# Patient Record
Sex: Female | Born: 1960
Health system: Southern US, Community
[De-identification: ages and names within clinical notes are randomized; demographics above are authoritative.]

## PROBLEM LIST (undated history)

## (undated) DIAGNOSIS — E785 Hyperlipidemia, unspecified: Secondary | ICD-10-CM

## (undated) DIAGNOSIS — IMO0002 Reserved for concepts with insufficient information to code with codable children: Secondary | ICD-10-CM

## (undated) DIAGNOSIS — F419 Anxiety disorder, unspecified: Secondary | ICD-10-CM

## (undated) DIAGNOSIS — D219 Benign neoplasm of connective and other soft tissue, unspecified: Secondary | ICD-10-CM

## (undated) DIAGNOSIS — I10 Essential (primary) hypertension: Secondary | ICD-10-CM

## (undated) DIAGNOSIS — Z9289 Personal history of other medical treatment: Secondary | ICD-10-CM

## (undated) HISTORY — DX: Hyperlipidemia, unspecified: E78.5

## (undated) HISTORY — DX: Reserved for concepts with insufficient information to code with codable children: IMO0002

## (undated) HISTORY — PX: COLONOSCOPY: SHX174

## (undated) HISTORY — DX: Personal history of other medical treatment: Z92.89

## (undated) HISTORY — PX: WISDOM TOOTH EXTRACTION: SHX21

## (undated) HISTORY — PX: MENISCUS REPAIR: SHX5179

## (undated) HISTORY — DX: Benign neoplasm of connective and other soft tissue, unspecified: D21.9

## (undated) HISTORY — DX: Essential (primary) hypertension: I10

## (undated) HISTORY — DX: Anxiety disorder, unspecified: F41.9

---

## 1980-10-11 HISTORY — PX: CRYOABLATION: SHX1415

## 2004-10-11 DIAGNOSIS — IMO0002 Reserved for concepts with insufficient information to code with codable children: Secondary | ICD-10-CM

## 2004-10-11 DIAGNOSIS — R87619 Unspecified abnormal cytological findings in specimens from cervix uteri: Secondary | ICD-10-CM

## 2004-10-11 HISTORY — PX: INTRAUTERINE DEVICE INSERTION: SHX323

## 2004-10-11 HISTORY — DX: Unspecified abnormal cytological findings in specimens from cervix uteri: R87.619

## 2004-10-11 HISTORY — DX: Reserved for concepts with insufficient information to code with codable children: IMO0002

## 2005-05-07 ENCOUNTER — Ambulatory Visit: Payer: Self-pay | Admitting: Family Medicine

## 2005-08-23 ENCOUNTER — Ambulatory Visit: Payer: Self-pay | Admitting: Family Medicine

## 2006-08-01 ENCOUNTER — Ambulatory Visit: Payer: Self-pay | Admitting: Family Medicine

## 2006-08-01 ENCOUNTER — Other Ambulatory Visit: Admission: RE | Admit: 2006-08-01 | Discharge: 2006-08-01 | Payer: Self-pay | Admitting: Family Medicine

## 2006-08-25 ENCOUNTER — Encounter: Admission: RE | Admit: 2006-08-25 | Discharge: 2006-08-25 | Payer: Self-pay | Admitting: Family Medicine

## 2006-10-01 ENCOUNTER — Encounter: Payer: Self-pay | Admitting: Family Medicine

## 2006-10-01 LAB — CONVERTED CEMR LAB: Pap Smear: NORMAL

## 2006-11-16 ENCOUNTER — Ambulatory Visit: Payer: Self-pay | Admitting: Obstetrics & Gynecology

## 2007-09-13 ENCOUNTER — Encounter: Admission: RE | Admit: 2007-09-13 | Discharge: 2007-09-13 | Payer: Self-pay | Admitting: Family Medicine

## 2007-09-18 ENCOUNTER — Encounter (INDEPENDENT_AMBULATORY_CARE_PROVIDER_SITE_OTHER): Payer: Self-pay | Admitting: *Deleted

## 2007-10-09 ENCOUNTER — Ambulatory Visit: Payer: Self-pay | Admitting: Family Medicine

## 2007-10-09 DIAGNOSIS — IMO0002 Reserved for concepts with insufficient information to code with codable children: Secondary | ICD-10-CM | POA: Insufficient documentation

## 2007-10-09 DIAGNOSIS — M170 Bilateral primary osteoarthritis of knee: Secondary | ICD-10-CM | POA: Insufficient documentation

## 2007-10-09 DIAGNOSIS — E785 Hyperlipidemia, unspecified: Secondary | ICD-10-CM | POA: Insufficient documentation

## 2007-10-09 DIAGNOSIS — M1710 Unilateral primary osteoarthritis, unspecified knee: Secondary | ICD-10-CM

## 2007-10-12 DIAGNOSIS — Z9289 Personal history of other medical treatment: Secondary | ICD-10-CM

## 2007-10-12 HISTORY — DX: Personal history of other medical treatment: Z92.89

## 2007-10-12 LAB — CONVERTED CEMR LAB: Pap Smear: NORMAL

## 2007-10-25 ENCOUNTER — Ambulatory Visit: Payer: Self-pay | Admitting: Obstetrics & Gynecology

## 2007-10-25 ENCOUNTER — Encounter: Payer: Self-pay | Admitting: Family Medicine

## 2007-10-26 ENCOUNTER — Ambulatory Visit (HOSPITAL_COMMUNITY): Admission: RE | Admit: 2007-10-26 | Discharge: 2007-10-26 | Payer: Self-pay | Admitting: Gynecology

## 2007-11-09 ENCOUNTER — Ambulatory Visit: Payer: Self-pay | Admitting: Family Medicine

## 2007-11-13 LAB — CONVERTED CEMR LAB
ALT: 11 units/L (ref 0–35)
AST: 21 units/L (ref 0–37)
Albumin: 3.6 g/dL (ref 3.5–5.2)
Alkaline Phosphatase: 65 units/L (ref 39–117)
BUN: 5 mg/dL — ABNORMAL LOW (ref 6–23)
Basophils Absolute: 0 10*3/uL (ref 0.0–0.1)
Basophils Relative: 0 % (ref 0.0–1.0)
Bilirubin, Direct: 0.1 mg/dL (ref 0.0–0.3)
CO2: 28 meq/L (ref 19–32)
Calcium: 9.2 mg/dL (ref 8.4–10.5)
Chloride: 106 meq/L (ref 96–112)
Cholesterol: 176 mg/dL (ref 0–200)
Creatinine, Ser: 1.1 mg/dL (ref 0.4–1.2)
Eosinophils Absolute: 0.1 10*3/uL (ref 0.0–0.6)
Eosinophils Relative: 1.3 % (ref 0.0–5.0)
GFR calc Af Amer: 68 mL/min
GFR calc non Af Amer: 56 mL/min
Glucose, Bld: 96 mg/dL (ref 70–99)
HCT: 41.1 % (ref 36.0–46.0)
HDL: 54.2 mg/dL (ref >39.0)
Hemoglobin: 13.3 g/dL (ref 12.0–15.0)
LDL Cholesterol: 111 mg/dL — ABNORMAL HIGH (ref 0–99)
Lymphocytes Relative: 29.4 % (ref 12.0–46.0)
MCHC: 32.4 g/dL (ref 30.0–36.0)
MCV: 98.7 fL (ref 78.0–100.0)
Monocytes Absolute: 0.6 10*3/uL (ref 0.2–0.7)
Monocytes Relative: 7.7 % (ref 3.0–11.0)
Neutro Abs: 4.5 10*3/uL (ref 1.4–7.7)
Neutrophils Relative %: 61.6 % (ref 43.0–77.0)
Platelets: 268 10*3/uL (ref 150–400)
Potassium: 4.1 meq/L (ref 3.5–5.1)
RBC: 4.16 M/uL (ref 3.87–5.11)
RDW: 12.3 % (ref 11.5–14.6)
Sodium: 140 meq/L (ref 135–145)
TSH: 2.02 microintl units/mL (ref 0.35–5.50)
Total Bilirubin: 1.1 mg/dL (ref 0.3–1.2)
Total CHOL/HDL Ratio: 3.2
Total Protein: 7.3 g/dL (ref 6.0–8.3)
Triglycerides: 55 mg/dL (ref 0–149)
VLDL: 11 mg/dL (ref 0–40)
WBC: 7.3 10*3/uL (ref 4.5–10.5)

## 2007-11-29 ENCOUNTER — Ambulatory Visit: Payer: Self-pay | Admitting: Family Medicine

## 2008-01-08 ENCOUNTER — Ambulatory Visit: Payer: Self-pay | Admitting: Family Medicine

## 2008-02-19 ENCOUNTER — Ambulatory Visit: Payer: Self-pay | Admitting: Family Medicine

## 2008-05-22 ENCOUNTER — Ambulatory Visit: Payer: Self-pay | Admitting: Obstetrics & Gynecology

## 2008-05-24 ENCOUNTER — Ambulatory Visit (HOSPITAL_COMMUNITY): Admission: RE | Admit: 2008-05-24 | Discharge: 2008-05-24 | Payer: Self-pay | Admitting: Gynecology

## 2008-06-24 ENCOUNTER — Ambulatory Visit: Payer: Self-pay | Admitting: Obstetrics & Gynecology

## 2008-08-20 ENCOUNTER — Ambulatory Visit: Payer: Self-pay | Admitting: Family Medicine

## 2008-08-21 LAB — CONVERTED CEMR LAB
ALT: 10 units/L (ref 0–35)
AST: 18 units/L (ref 0–37)
Albumin: 3.4 g/dL — ABNORMAL LOW (ref 3.5–5.2)
Alkaline Phosphatase: 60 units/L (ref 39–117)
BUN: 7 mg/dL (ref 6–23)
Basophils Absolute: 0 10*3/uL (ref 0.0–0.1)
Basophils Relative: 0.5 % (ref 0.0–3.0)
Bilirubin, Direct: 0.1 mg/dL (ref 0.0–0.3)
CO2: 27 meq/L (ref 19–32)
Calcium: 9.2 mg/dL (ref 8.4–10.5)
Chloride: 106 meq/L (ref 96–112)
Cholesterol: 180 mg/dL (ref 0–200)
Creatinine, Ser: 0.9 mg/dL (ref 0.4–1.2)
Eosinophils Absolute: 0.1 10*3/uL (ref 0.0–0.7)
Eosinophils Relative: 1.4 % (ref 0.0–5.0)
GFR calc Af Amer: 86 mL/min
GFR calc non Af Amer: 71 mL/min
Glucose, Bld: 84 mg/dL (ref 70–99)
HCT: 39.1 % (ref 36.0–46.0)
HDL: 46.8 mg/dL (ref >39.0)
Hemoglobin: 13.2 g/dL (ref 12.0–15.0)
LDL Cholesterol: 112 mg/dL — ABNORMAL HIGH (ref 0–99)
Lymphocytes Relative: 28 % (ref 12.0–46.0)
MCHC: 33.7 g/dL (ref 30.0–36.0)
MCV: 99.2 fL (ref 78.0–100.0)
Monocytes Absolute: 0.4 10*3/uL (ref 0.1–1.0)
Monocytes Relative: 6.8 % (ref 3.0–12.0)
Neutro Abs: 4.1 10*3/uL (ref 1.4–7.7)
Neutrophils Relative %: 63.3 % (ref 43.0–77.0)
Platelets: 247 10*3/uL (ref 150–400)
Potassium: 4 meq/L (ref 3.5–5.1)
RBC: 3.94 M/uL (ref 3.87–5.11)
RDW: 12.2 % (ref 11.5–14.6)
Sodium: 139 meq/L (ref 135–145)
TSH: 2 microintl units/mL (ref 0.35–5.50)
Total Bilirubin: 0.8 mg/dL (ref 0.3–1.2)
Total CHOL/HDL Ratio: 3.8
Total Protein: 7.1 g/dL (ref 6.0–8.3)
Triglycerides: 107 mg/dL (ref 0–149)
VLDL: 21 mg/dL (ref 0–40)
WBC: 6.4 10*3/uL (ref 4.5–10.5)

## 2008-09-19 ENCOUNTER — Ambulatory Visit: Payer: Self-pay | Admitting: Obstetrics & Gynecology

## 2008-09-30 ENCOUNTER — Encounter: Admission: RE | Admit: 2008-09-30 | Discharge: 2008-09-30 | Payer: Self-pay | Admitting: Family Medicine

## 2008-12-23 ENCOUNTER — Encounter: Payer: Self-pay | Admitting: Obstetrics & Gynecology

## 2008-12-23 ENCOUNTER — Ambulatory Visit: Payer: Self-pay | Admitting: Obstetrics & Gynecology

## 2008-12-23 LAB — CONVERTED CEMR LAB: Pap Smear: NORMAL

## 2008-12-24 ENCOUNTER — Encounter: Payer: Self-pay | Admitting: Family Medicine

## 2008-12-24 LAB — CONVERTED CEMR LAB
Trich, Wet Prep: NONE SEEN
Yeast Wet Prep HPF POC: NONE SEEN

## 2009-02-18 ENCOUNTER — Ambulatory Visit: Payer: Self-pay | Admitting: Family Medicine

## 2009-02-28 ENCOUNTER — Ambulatory Visit: Payer: Self-pay | Admitting: Internal Medicine

## 2009-03-17 ENCOUNTER — Ambulatory Visit: Payer: Self-pay | Admitting: Internal Medicine

## 2009-03-17 LAB — HM COLONOSCOPY

## 2009-10-01 ENCOUNTER — Encounter: Admission: RE | Admit: 2009-10-01 | Discharge: 2009-10-01 | Payer: Self-pay | Admitting: Family Medicine

## 2009-10-06 ENCOUNTER — Encounter (INDEPENDENT_AMBULATORY_CARE_PROVIDER_SITE_OTHER): Payer: Self-pay | Admitting: *Deleted

## 2009-12-25 ENCOUNTER — Ambulatory Visit: Payer: Self-pay | Admitting: Family Medicine

## 2010-01-29 ENCOUNTER — Ambulatory Visit: Payer: Self-pay | Admitting: Family Medicine

## 2010-01-29 DIAGNOSIS — I10 Essential (primary) hypertension: Secondary | ICD-10-CM | POA: Insufficient documentation

## 2010-01-29 LAB — CONVERTED CEMR LAB
BUN: 6 mg/dL (ref 6–23)
Basophils Absolute: 0 10*3/uL (ref 0.0–0.1)
Basophils Relative: 0.3 % (ref 0.0–3.0)
CO2: 27 meq/L (ref 19–32)
Calcium: 9.3 mg/dL (ref 8.4–10.5)
Chloride: 106 meq/L (ref 96–112)
Creatinine, Ser: 0.9 mg/dL (ref 0.4–1.2)
Eosinophils Absolute: 0.1 10*3/uL (ref 0.0–0.7)
Eosinophils Relative: 1.7 % (ref 0.0–5.0)
GFR calc non Af Amer: 84.79 mL/min (ref >60)
Glucose, Bld: 91 mg/dL (ref 70–99)
HCT: 37.8 % (ref 36.0–46.0)
Hemoglobin: 12.6 g/dL (ref 12.0–15.0)
Lymphocytes Relative: 29.1 % (ref 12.0–46.0)
Lymphs Abs: 1.7 10*3/uL (ref 0.7–4.0)
MCHC: 33.5 g/dL (ref 30.0–36.0)
MCV: 98.9 fL (ref 78.0–100.0)
Monocytes Absolute: 0.4 10*3/uL (ref 0.1–1.0)
Monocytes Relative: 7.6 % (ref 3.0–12.0)
Neutro Abs: 3.5 10*3/uL (ref 1.4–7.7)
Neutrophils Relative %: 61.3 % (ref 43.0–77.0)
Platelets: 263 10*3/uL (ref 150.0–400.0)
Potassium: 4.1 meq/L (ref 3.5–5.1)
RBC: 3.82 M/uL — ABNORMAL LOW (ref 3.87–5.11)
RDW: 13.2 % (ref 11.5–14.6)
Sodium: 139 meq/L (ref 135–145)
TSH: 1.9 microintl units/mL (ref 0.35–5.50)
WBC: 5.8 10*3/uL (ref 4.5–10.5)

## 2010-02-11 ENCOUNTER — Ambulatory Visit: Payer: Self-pay | Admitting: Family Medicine

## 2010-02-13 LAB — CONVERTED CEMR LAB
ALT: 9 units/L (ref 0–35)
AST: 24 units/L (ref 0–37)
Albumin: 3.8 g/dL (ref 3.5–5.2)
BUN: 9 mg/dL (ref 6–23)
CO2: 32 meq/L (ref 19–32)
Calcium: 9.7 mg/dL (ref 8.4–10.5)
Chloride: 104 meq/L (ref 96–112)
Cholesterol: 191 mg/dL (ref 0–200)
Creatinine, Ser: 1 mg/dL (ref 0.4–1.2)
GFR calc non Af Amer: 75.08 mL/min (ref >60)
Glucose, Bld: 81 mg/dL (ref 70–99)
HDL: 60.5 mg/dL (ref >39.00)
LDL Cholesterol: 112 mg/dL — ABNORMAL HIGH (ref 0–99)
Phosphorus: 3.7 mg/dL (ref 2.3–4.6)
Potassium: 4.1 meq/L (ref 3.5–5.1)
Sodium: 142 meq/L (ref 135–145)
Total CHOL/HDL Ratio: 3
Triglycerides: 95 mg/dL (ref 0.0–149.0)
VLDL: 19 mg/dL (ref 0.0–40.0)

## 2010-08-18 ENCOUNTER — Ambulatory Visit: Payer: Self-pay | Admitting: Family Medicine

## 2010-08-18 DIAGNOSIS — M549 Dorsalgia, unspecified: Secondary | ICD-10-CM | POA: Insufficient documentation

## 2010-10-02 ENCOUNTER — Encounter
Admission: RE | Admit: 2010-10-02 | Discharge: 2010-10-02 | Payer: Self-pay | Source: Home / Self Care | Attending: Family Medicine | Admitting: Family Medicine

## 2010-10-02 LAB — HM MAMMOGRAPHY: HM Mammogram: NORMAL

## 2010-10-06 ENCOUNTER — Encounter (INDEPENDENT_AMBULATORY_CARE_PROVIDER_SITE_OTHER): Payer: Self-pay | Admitting: *Deleted

## 2010-10-06 ENCOUNTER — Encounter: Payer: Self-pay | Admitting: Family Medicine

## 2010-11-10 NOTE — Assessment & Plan Note (Signed)
Summary: 2 m f/u  dlo   Vital Signs:  Patient Profile:   50 Years Old Gibbs Height:     66 inches Weight:      166 pounds Temp:     97.8 degrees F oral Pulse rate:   84 / minute Pulse rhythm:   regular BP sitting:   176 / 102  (right arm) Cuff size:   large  Vitals Entered By: Lowella Petties (March Kristin, 2009 12:05 PM)                 Serial Vital Signs/Assessments:  Time      Position  BP       Pulse  Resp  Temp     By                     145/95                         Judith Part MD   Chief Complaint:  2 month follow up.  History of Present Illness: blood pressure is still elevated today  thinks IUD may have aff blood pressure had IUD removed on 2/18 she has been reading about other pts with longer term reactions to the iud  still having symptoms since last peroid which was immed, then heavy menses 20 days later- and that just ended occ had episodes of palpitations -during the day   OC in past would cause bp to go up        Current Allergies: No known allergies   Past Medical History:    Reviewed history from 11/09/2007 and no changes required:       Hyperlipidemia       uterine fibroids       elevated bp   Past Surgical History:    Reviewed history from 11/09/2007 and no changes required:       Knee surgery (2002)       Abn. pap- endometrial cells, biopsy o.k. (2006)       IUD removed (2006)       pelvic US 1/09 uterine fibroids   Family History:    Reviewed history from 11/09/2007 and no changes required:       Father: glaucoma       Mother: heart disease, MI, smoker, thyroid probs, chol ,HTN       Siblings:        daughter goiter  Social History:    Reviewed history from 10/09/2007 and no changes required:       Marital Status: single       Children: 2 daughters       Occupation: [employer]     Physical Exam  General:     Well-developed,well-nourished,in no acute distress; alert,appropriate and cooperative throughout  examination Head:     normocephalic, atraumatic, and no abnormalities observed.   Eyes:     vision grossly intact, pupils equal, pupils round, and pupils reactive to light.   Neck:     supple with full rom and no masses or thyromegally, no JVD or carotid bruit  Lungs:     Normal respiratory effort, chest expands symmetrically. Lungs are clear to auscultation, no crackles or wheezes. Heart:     Normal rate and regular rhythm. S1 and S2 normal without gallop, murmur, click, rub or other extra sounds. Abdomen:     no renal bruitssoft, non-tender, normal bowel sounds, and no masses.   Pulses:  R and L carotid,radial,femoral,dorsalis pedis and posterior tibial pulses are full and equal bilaterally Extremities:     No clubbing, cyanosis, edema, or deformity noted with normal full range of motion of all joints.   Neurologic:     cranial nerves II-XII intact, sensation intact to light touch, gait normal, and DTRs symmetrical and normal.  no tremor  Skin:     Intact without suspicious lesions or rashes Cervical Nodes:     No lymphadenopathy noted Psych:     normal affect, talkative and pleasant     Impression & Recommendations:  Problem # 1:  ELEVATED BLOOD PRESSURE WITHOUT DIAGNOSIS OF HYPERTENSION (ICD-796.2) Assessment: Deteriorated pt still in doubt that she could be developing essential HTN and wonders about hormonal factor she will be f/u with gyn to disc further otherwise- will obt home bp cuff and begin randomly checking it continue good lifestyle change- and f/u 1-2 mo will disc tx or perhaps cardiol consult if not better if any more episodes of dizziness/palp- will update Orders: EKG w/ Interpretation (93000)   Complete Medication List: 1)  Multivitamins Tabs (Multiple vitamin) .... Take one by mouth once a day   Patient Instructions: 1)  the blood pressure cuff to get is OMRON for arm (not wrist) 2)  check blood pressure when relaxed- not right after exercise,  not when upset or stressed 3)  keep up great habits and avoid salty foods 4)  follow up with Dr Shawnie Pons for questions about hormones 5)  follow up in 1-2 months with blood pressure readiings and cuff     ]  EKG  Procedure date:  03/Kristin/2009  Findings:      NSR rate 75 computer read as st junctional depression - nonspecific no acute changes per my reading

## 2010-11-10 NOTE — Procedures (Signed)
Summary: Colonoscopy   Colonoscopy  Procedure date:  03/17/2009  Findings:      Location:  [facility].    Procedures Next Due Date:    Colonoscopy: 03/2019  COLONOSCOPY PROCEDURE REPORT  PATIENT:  [name], [name]  MR#:  [id number] BIRTHDATE:   October 31, 1960, 48 yrs. old   GENDER:   female  ENDOSCOPIST:   Hedwig Morton. Juanda Chance, MD Referred by: Marne A. Milinda Antis, M.D.  PROCEDURE DATE:  03/17/2009 PROCEDURE:  Average-risk screening colonoscopy ASA CLASS:   Class I INDICATIONS: Routine Risk Screening   MEDICATIONS:    Versed 7 mg, Fentanyl 50 mcg  DESCRIPTION OF PROCEDURE:   After the risks benefits and alternatives of the procedure were thoroughly explained, informed consent was obtained.  Digital rectal exam was performed and revealed no rectal masses.   The LB [id number] endoscope was introduced through the anus and advanced to the cecum, which was identified by both the appendix and ileocecal valve, without limitations.  The quality of the prep was good, using MoviPrep.  The instrument was then slowly withdrawn as the colon was fully examined. <<PROCEDUREIMAGES>>        <<OLD IMAGES>>  FINDINGS:  A normal appearing cecum, ileocecal valve, and appendiceal orifice were identified. The ascending, transverse, descending, sigmoid colon, and rectum appeared unremarkable (see image1, image2, image3, and image4).   Retroflexed views in the rectum revealed no abnormalities.    The scope was then withdrawn from the patient and the procedure completed.  COMPLICATIONS:   None  ENDOSCOPIC IMPRESSION:  1) Normal colon  2) No polyps or cancers RECOMMENDATIONS:  1) hemoccults q 2-3 years  2) high fiber diet  REPEAT EXAM:   In 10 year(s) for.   _______________________________ Hedwig Morton. Juanda Chance, MD  CC:

## 2010-11-10 NOTE — Assessment & Plan Note (Signed)
Summary: 6 m f/u  dlo   Vital Signs:  Patient Profile:   50 Years Old Female Height:     66 inches Weight:      167 pounds BMI:     27.05 Temp:     98 degrees F oral Pulse rate:   96 / minute Pulse rhythm:   regular BP sitting:   160 / 100  (left arm) Cuff size:   regular  Vitals Entered By: Liane Comber (August 20, 2008 8:37 AM)                 Serial Vital Signs/Assessments:  Time      Position  BP       Pulse  Resp  Temp     By                     145/90                         Judith Part MD   Chief Complaint:  6 mo f/u.  History of Present Illness: takes her bp at home regularly--  systolic is 125-130 over 89 (never gets over 90) overall is feeling fine   is still exercising 3 days per week- wants to increase this  wt is stable  still trying to loose wt- is eating very healthy  has tested her bp cuff-- and it is accurate   PE is due at end of Jan  saw gyn around sept   is interested in trying estroven for nt time hot flashes and sleeplessness  wakes up with sweats  is still have some intermittent vag bleeding -- irregular   needs ref/ sched for mam  no lumps on self breast exams      Current Allergies (reviewed today): No known allergies   Past Medical History:    Reviewed history from 02/19/2008 and no changes required:       Hyperlipidemia       uterine fibroids       elevated bp - whitecoat              [facility]                 Past Surgical History:    Reviewed history from 11/09/2007 and no changes required:       Knee surgery (2002)       Abn. pap- endometrial cells, biopsy o.k. (2006)       IUD removed (2006)       pelvic US 1/09 uterine fibroids   Family History:    Reviewed history from 11/09/2007 and no changes required:       Father: glaucoma       Mother: heart disease, MI with (stent), smoker, thyroid probs, chol ,HTN       Siblings:        daughter goiter  Social History:    Reviewed history  from 02/19/2008 and no changes required:       Marital Status: single       Children: 2 daughters       Occupation: [employer]       non smoker       rare alcohol    Review of Systems  General      Denies fatigue, fever, and loss of appetite.  Eyes      Denies blurring and eye pain.  CV  Denies chest pain or discomfort, palpitations, and shortness of breath with exertion.  Resp      Denies cough and wheezing.  GI      Denies abdominal pain and change in bowel habits.  GU      Denies urinary frequency.  MS      Complains of joint pain.      R knee sore (meniscal surg in past- suspects OA)  Derm      Denies lesion(s) and rash.  Neuro      Denies numbness and tingling.  Psych      mood is good   Endo      Denies excessive thirst and excessive urination.   Physical Exam  General:     Well-developed,well-nourished,in no acute distress; alert,appropriate and cooperative throughout examination Head:     normocephalic, atraumatic, and no abnormalities observed.   Eyes:     vision grossly intact, pupils equal, pupils round, and pupils reactive to light.   Mouth:     pharynx pink and moist.   Neck:     supple with full rom and no masses or thyromegally, no JVD or carotid bruit  Lungs:     Normal respiratory effort, chest expands symmetrically. Lungs are clear to auscultation, no crackles or wheezes. Heart:     Normal rate and regular rhythm. S1 and S2 normal without gallop, murmur, click, rub or other extra sounds. Abdomen:     soft and non-tender.  no renal bruits  Msk:     No deformity or scoliosis noted of thoracic or lumbar spine.   Pulses:     R and L carotid,radial,femoral,dorsalis pedis and posterior tibial pulses are full and equal bilaterally Extremities:     No clubbing, cyanosis, edema, or deformity noted with normal full range of motion of all joints.   Neurologic:     sensation intact to light touch, gait normal, and DTRs symmetrical and normal.   no tremor  Skin:     Intact without suspicious lesions or rashes Cervical Nodes:     No lymphadenopathy noted Psych:     normal affect, talkative and pleasant     Impression & Recommendations:  Problem # 1:  ELEVATED BLOOD PRESSURE WITHOUT DIAGNOSIS OF HYPERTENSION (ICD-796.2) Assessment: Unchanged with much better bp at home with accurate cuff need to watch borderline diastolic bp  adv to keep working on diet and exercise labs today f/u 6 mo for check up Orders: Venipuncture (52841) TLB-Lipid Panel (80061-LIPID) TLB-CBC Platelet - w/Differential (85025-CBCD) TLB-BMP (Basic Metabolic Panel-BMET) (80048-METABOL) TLB-Hepatic/Liver Function Pnl (80076-HEPATIC) TLB-TSH (Thyroid Stimulating Hormone) (84443-TSH)  BP today: 160/100--re check at rest 145/90 Prior BP: 160/96 (02/19/2008)  Labs Reviewed: Creat: 1.1 (11/09/2007) Chol: 176 (11/09/2007)   HDL: 54.2 (11/09/2007)   LDL: 111 (11/09/2007)   TG: 55 (11/09/2007)   Problem # 2:  HYPERLIPIDEMIA (ICD-272.4) Assessment: Improved mild - adv to continue good diet- pt suspects will be imp today reviewed sat fats in diet reviewed plan to exercise more days per week  Orders: Venipuncture (32440) TLB-Lipid Panel (80061-LIPID) TLB-CBC Platelet - w/Differential (85025-CBCD) TLB-BMP (Basic Metabolic Panel-BMET) (80048-METABOL) TLB-Hepatic/Liver Function Pnl (80076-HEPATIC) TLB-TSH (Thyroid Stimulating Hormone) (84443-TSH)  Labs Reviewed: Chol: 176 (11/09/2007)   HDL: 54.2 (11/09/2007)   LDL: 111 (11/09/2007)   TG: 55 (11/09/2007) SGOT: 21 (11/09/2007)   SGPT: 11 (11/09/2007)   Problem # 3:  OTHER SCREENING MAMMOGRAM (ICD-V76.12) Assessment: Comment Only schedule annual mam enc self exams  has breast exam at gyn visits  Orders: Radiology Referral (Radiology)   Complete Medication List: 1)  Multivitamins Tabs (Multiple vitamin) .... Take one by mouth once a day   Patient Instructions: 1)  keep watching blood pressure  at home - check it at rest 2)  update me if systolic over 140 or diastolic is over 90 3)  watch diet , keep exercising  4)  schedule follow up for check up in 6 months 5)  labs today 6)  we will schedule your mammogram at check out    ]

## 2010-11-10 NOTE — Assessment & Plan Note (Signed)
Summary: DIZZY OFF AND ON   Vital Signs:  Patient profile:   50 year old female Height:      66 inches Weight:      171.50 pounds BMI:     27.78 Temp:     98.3 degrees F oral Pulse rate:   88 / minute Pulse rhythm:   regular BP sitting:   160 / 100  (left arm) Cuff size:   regular  Vitals Entered By: Linde Gillis CMA Duncan Dull) (January 29, 2010 8:48 AM) CC: blood pressure concerns, dizzy   History of Present Illness: 50 yo new to me here for dizziness and increased BP.  Last few months, noticed increaed fatigue, headaches and occassional dizziness. Never feels like room is spinning.  Not worsened by changing positions. BP was elevated in May 2010 (old office visits reviewed).  Pt bought home BP cuff and BPs have been steadily increasing to about what she is presenting with today.  Over last few weeks, all of her symptoms have worsening. No DOE or CP.  Current Medications (verified): 1)  Multivitamins   Tabs (Multiple Vitamin) .... Take One By Mouth Once A Day 2)  Hydrochlorothiazide 12.5 Mg  Tabs (Hydrochlorothiazide) .... Take 1 Tab  By Mouth Every Morning  Allergies (verified): No Known Drug Allergies  Review of Systems      See HPI General:  Denies chills and fever. Eyes:  Denies blurring. CV:  Denies chest pain or discomfort. Resp:  Denies shortness of breath. Neuro:  Complains of headaches; denies sensation of room spinning. Endo:  Denies cold intolerance, heat intolerance, and weight change.  Physical Exam  General:  Well-developed,well-nourished,in no acute distress; alert,appropriate and cooperative throughout examination Mouth:  pharynx pink and moist.   Lungs:  Normal respiratory effort, chest expands symmetrically. Lungs are clear to auscultation, no crackles or wheezes. Heart:  Normal rate and regular rhythm. S1 and S2 normal without gallop, murmur, click, rub or other extra sounds. Extremities:  No clubbing, cyanosis, edema, or deformity noted with normal  full range of motion of all joints.   Psych:  normal affect, talkative and pleasant    Impression & Recommendations:  Problem # 1:  ESSENTIAL HYPERTENSION, BENIGN (ICD-401.1) Assessment New Time spent with patient 25 minutes, more than 50% of this time was spent counseling patient on HTN.  She is very nervous about starting medication.  We discussed HCTZ and ACEI, she would prefer to try HCTZ first. Will check BMET today and start HCTZ 12.5 mg daily. Has follow up scheduled with Dr. Milinda Antis already next month.  Her updated medication list for this problem includes:    Hydrochlorothiazide 12.5 Mg Tabs (Hydrochlorothiazide) .Marland Kitchen... Take 1 tab  by mouth every morning  Orders: Venipuncture (76160) TLB-BMP (Basic Metabolic Panel-BMET) (80048-METABOL)  Problem # 2:  FATIGUE (ICD-780.79) Assessment: New Likely related to #1 but will also check TSH and CBC to rule out other causes. Orders: Venipuncture (73710) TLB-CBC Platelet - w/Differential (85025-CBCD) TLB-TSH (Thyroid Stimulating Hormone) (84443-TSH)  Complete Medication List: 1)  Multivitamins Tabs (Multiple vitamin) .... Take one by mouth once a day 2)  Hydrochlorothiazide 12.5 Mg Tabs (Hydrochlorothiazide) .... Take 1 tab  by mouth every morning  Patient Instructions: 1)  It was nice to meet you. 2)  Limit your Sodium(salt) .  3)  Take hydrochlorothizide daily. 4)  Keep appt with Dr. Milinda Antis in May. Prescriptions: HYDROCHLOROTHIAZIDE 12.5 MG  TABS (HYDROCHLOROTHIAZIDE) Take 1 tab  by mouth every morning  #90 x 0  Entered and Authorized by:   Ruthe Mannan MD   Signed by:   Ruthe Mannan MD on 01/29/2010   Method used:   Electron[facility] to        [facility]  [address] (retail)       [address]       [place], [place]  [address]       Ph: [phone] or [phone]       Fax: [phone]   RxID:   [id number]   Current Allergies (reviewed today): No known allergies

## 2010-11-10 NOTE — Assessment & Plan Note (Signed)
Summary: CPX/CLE  r/s from 11/27/09   Vital Signs:  Patient profile:   50 year old female Height:      66 inches Weight:      169 pounds BMI:     27.38 Temp:     98.1 degrees F oral Pulse rate:   92 / minute Pulse rhythm:   regular BP sitting:   152 / 94  (left arm) Cuff size:   regular  Vitals Entered By: Lewanda Rife LPN (Feb 11, 1609 10:45 AM)  Serial Vital Signs/Assessments:  Time      Position  BP       Pulse  Resp  Temp     By                     140/85                         Judith Part MD  CC: CPX (GYN does pap and breast exam Pt had in 12/2009)   History of Present Illness: here for health mt exam and to review chronic med probs  is very stressed with school year and family issues  mother had a GI bleed and colon cancer - new dx -- is stablilized   wt is down 2 lb  has hx of whitecoat htn and was also started on hct last visit  bp first check 152/94 today  is tolerating the hctz 12.5 fine - had headache for 2-3 days and now fine  bp at home 130s/ 80-90, this past sat 128/89  stress makes it worse   no exercise in past 2 weeks  before that the P90X - really chanllenging   lipids are due - will do today   sees gyn - due in march  pap 3/10 mam 12/10--no lumps on self exam  due colonosc 2020- last one nl -- now  Td 03  nl bmpand cbc and thyroid recently worked up for dizziness -- this is better now   has spot on mid back - is itchy and irritated under bra- she wants it removed  she does wear sun protection most of the time  Allergies (verified): No Known Drug Allergies  Past History:  Past Medical History: Last updated: 08/20/2008 Hyperlipidemia uterine fibroids elevated bp - whitecoat  [facility]   Past Surgical History: Last updated: 11/09/2007 Knee surgery (2002) Abn. pap- endometrial cells, biopsy o.k. (2006) IUD removed (2006) pelvic US 1/09 uterine fibroids  Family History: Last updated: 02/11/2010 Father:  glaucoma Mother: heart disease, MI with (stent), smoker, thyroid probs, chol , colon cancer ,HTN Siblings:  daughter goiter  Social History: Last updated: 02/18/2009 Marital Status: single Children: 2 daughters Occupation: [employer] non smoker rare alcohol regular exercise   Risk Factors: Smoking Status: never (10/09/2007)  Family History: Father: glaucoma Mother: heart disease, MI with (stent), smoker, thyroid probs, chol , colon cancer ,HTN Siblings:  daughter goiter  Review of Systems General:  Denies fatigue, loss of appetite, and malaise. Eyes:  Denies blurring and eye pain. CV:  Denies chest pain or discomfort, lightheadness, and palpitations. Resp:  Denies cough, pleuritic, and shortness of breath. GI:  Denies abdominal pain, change in bowel habits, and indigestion. GU:  Denies dysuria and urinary frequency. MS:  Denies joint pain, joint redness, joint swelling, cramps, and muscle weakness. Derm:  Complains of itching and lesion(s); denies poor wound healing and rash. Neuro:  Denies numbness and  tingling. Psych:  Complains of anxiety; denies depression. Endo:  Denies excessive thirst and excessive urination. Heme:  Denies abnormal bruising and bleeding.  Physical Exam  General:  Well-developed,well-nourished,in no acute distress; alert,appropriate and cooperative throughout examination Head:  normocephalic, atraumatic, and no abnormalities observed.   Eyes:  vision grossly intact, pupils equal, pupils round, and pupils reactive to light.  no conjunctival pallor, injection or icterus  Mouth:  pharynx pink and moist.   Neck:  supple with full rom and no masses or thyromegally, no JVD or carotid bruit  Chest Wall:  No deformities, masses, or tenderness noted. Lungs:  Normal respiratory effort, chest expands symmetrically. Lungs are clear to auscultation, no crackles or wheezes. Heart:  Normal rate and regular rhythm. S1 and S2 normal without gallop, murmur, click, rub or  other extra sounds. Abdomen:  Bowel sounds positive,abdomen soft and non-tender without masses, organomegaly or hernias noted. no renal bruits  Msk:  No deformity or scoliosis noted of thoracic or lumbar spine.  no acute joint changes Pulses:  R and L carotid,radial,femoral,dorsalis pedis and posterior tibial pulses are full and equal bilaterally Extremities:  No clubbing, cyanosis, edema, or deformity noted with normal full range of motion of all joints.   Neurologic:  sensation intact to light touch, gait normal, and DTRs symmetrical and normal.   Skin:  1 by 2 cm sk oval / brown --under bra line on back tx with cryo Cervical Nodes:  No lymphadenopathy noted Inguinal Nodes:  No significant adenopathy Psych:  nl affect  not overly anxious  good communication skills    Impression & Recommendations:  Problem # 1:  HEALTH MAINTENANCE EXAM (ICD-V70.0) Assessment Comment Only reviewed health habits including diet, exercise and skin cancer prevention reviewed health maintenance list and family history rev recent labs lab done today for chol   Problem # 2:  ESSENTIAL HYPERTENSION, BENIGN (ICD-401.1) Assessment: Improved  imp on hctz and better on 2nd check even with whitecoat continue hctz and low salt diet  lab today  keep checking at home Her updated medication list for this problem includes:    Hydrochlorothiazide 12.5 Mg Tabs (Hydrochlorothiazide) .Marland Kitchen... Take 1 tab  by mouth every morning  Orders: Venipuncture (84696) TLB-Lipid Panel (80061-LIPID) TLB-Renal Function Panel (80069-RENAL) TLB-ALT (SGPT) (84460-ALT) TLB-AST (SGOT) (84450-SGOT) Prescription Created Electronically [phone])  BP today: 152/94- re check 140/85 at rest  Prior BP: 160/100 (01/29/2010)  Labs Reviewed: K+: 4.1 (01/29/2010) Creat: : 0.9 (01/29/2010)   Chol: 180 (08/20/2008)   HDL: 46.8 (08/20/2008)   LDL: 112 (08/20/2008)   TG: 107 (08/20/2008)  Problem # 3:  HYPERLIPIDEMIA (ICD-272.4) Assessment:  Unchanged  lab today rev low sat fat diet in detail   Orders: Venipuncture (41324) TLB-Lipid Panel (80061-LIPID) TLB-Renal Function Panel (80069-RENAL) TLB-ALT (SGPT) (84460-ALT) TLB-AST (SGOT) (84450-SGOT)  Labs Reviewed: SGOT: 18 (08/20/2008)   SGPT: 10 (08/20/2008)   HDL:46.8 (08/20/2008), 54.2 (11/09/2007)  LDL:112 (08/20/2008), 111 (11/09/2007)  Chol:180 (08/20/2008), 176 (11/09/2007)  Trig:107 (08/20/2008), 55 (11/09/2007)  Problem # 4:  SEBORRHEIC KERATOSIS, INFLAMED (ICD-702.11)  inflammed / itchy sk under bra line on back tx with cryo today  Orders: Cryotherapy/Destruction benign or premalignant lesion (1st lesion)  (17000)  Complete Medication List: 1)  Multivitamins Tabs (Multiple vitamin) .... Take one by mouth once a day 2)  Hydrochlorothiazide 12.5 Mg Tabs (Hydrochlorothiazide) .... Take 1 tab  by mouth every morning  Patient Instructions: 1)  stick with dash diet and drink lots of water  2)  continue the hctz -  update me if bp is over 140/90 peristantly  3)  get back to exercise when you can  4)  will plan on colonosc being early in 2015 5)  keep area on back clean and dry  6)  labs today for cholesterol and kidney function  Prescriptions: HYDROCHLOROTHIAZIDE 12.5 MG  TABS (HYDROCHLOROTHIAZIDE) Take 1 tab  by mouth every morning  #90 x 3   Entered and Authorized by:   Judith Part MD   Signed by:   Judith Part MD on 02/11/2010   Method used:   [facility]        [facility]  [id number]       [address]       [place], [place]  [address]       Ph: [phone] or [phone]       Fax: [phone]   RxID:   [id number]   Current Allergies (reviewed today): No known allergies   Preventive Care Screening     colonosc now due in 2015 -- instead of 2020 due to family hx

## 2010-11-10 NOTE — Miscellaneous (Signed)
Summary: mammogram results  Clinical Lists Changes  Observations: Added new observation of MAMMO DUE: 10/2010 (10/06/2009 10:46) Added new observation of MAMMOGRAM: normal (10/06/2009 10:46)      Preventive Care Screening  Mammogram:    Date:  10/06/2009    Next Due:  10/2010    Results:  normal

## 2010-11-10 NOTE — Assessment & Plan Note (Signed)
Summary: 6 month follow up/rbh   Vital Signs:  Patient profile:   50 year old female Height:      66 inches Weight:      172.25 pounds BMI:     27.90 Temp:     98.1 degrees F oral Pulse rate:   76 / minute Pulse rhythm:   regular BP sitting:   140 / 82  (left arm) Cuff size:   regular  Vitals Entered By: Lewanda Rife LPN (August 18, 2010 8:07 AM)  Serial Vital Signs/Assessments:  Time      Position  BP       Pulse  Resp  Temp     By                     130/80                         Judith Part MD  CC: six month f/u   History of Present Illness: here for f/u of HTN and lipids   has component of whitecoat syndrome first bp is 140/82 outside the office bp is fine 120s -130 / 80s   labs good  lipids trig 95 and HDL 60 and LDL 112  diet is overall good  is trying to eat healthy except sweets   wt is up 3 lb  really trying to loose wt  is back to exercising -- elliptical/ treadmill for 45 min and then some weights that is great -- 4 d per week   eating out is downfall   hectic in terms of prep meals during the week  eats on the run   is having pain over L ribs and R lower back at times after exercise "rippling " feeling  no numbness or tingling  some positional changes  has been trying pilates and P90X which is very intense and hard to get through  Allergies (verified): No Known Drug Allergies  Past History:  Past Surgical History: Last updated: 11/09/2007 Knee surgery (2002) Abn. pap- endometrial cells, biopsy o.k. (2006) IUD removed (2006) pelvic US 1/09 uterine fibroids  Family History: Last updated: 02/11/2010 Father: glaucoma Mother: heart disease, MI with (stent), smoker, thyroid probs, chol , colon cancer ,HTN Siblings:  daughter goiter  Social History: Last updated: 08/18/2010 Marital Status: single Children: 2 daughters Occupation: [employer] non smoker rare alcohol regular exercise 4 d per week / cardio and strength training   Risk  Factors: Smoking Status: never (10/09/2007)  Past Medical History: Hyperlipidemia uterine fibroids HTN with some whitecoat component  GYN-- [facility]   Social History: Marital Status: single Children: 2 daughters Occupation: [employer] non smoker rare alcohol regular exercise 4 d per week / cardio and strength training   Review of Systems General:  Denies fatigue, loss of appetite, and malaise. Eyes:  Denies blurring and eye irritation. CV:  Denies chest pain or discomfort, lightheadness, and palpitations. Resp:  Denies cough, shortness of breath, and wheezing. GI:  Denies abdominal pain, indigestion, and nausea. MS:  Complains of low back pain and stiffness; denies joint pain, joint redness, joint swelling, cramps, and muscle weakness. Derm:  Denies lesion(s) and rash. Neuro:  Denies numbness, tingling, and weakness. Endo:  Denies cold intolerance, excessive thirst, excessive urination, and heat intolerance. Heme:  Denies abnormal bruising and bleeding.  Physical Exam  General:  mildly overwt and well appearing  Head:  normocephalic, atraumatic, and no abnormalities observed.  Eyes:  vision grossly intact, pupils equal, pupils round, and pupils reactive to light.  no conjunctival pallor, injection or icterus  Mouth:  pharynx pink and moist.   Neck:  supple with full rom and no masses or thyromegally, no JVD or carotid bruit  Chest Wall:  no tenderness over lateral ribs today no crepitice or skin change Lungs:  Normal respiratory effort, chest expands symmetrically. Lungs are clear to auscultation, no crackles or wheezes. Heart:  Normal rate and regular rhythm. S1 and S2 normal without gallop, murmur, click, rub or other extra sounds. Abdomen:  soft, non-tender, and normal bowel sounds.  no renal bruits  Msk:  some mild tenderness over R upper lumbar musculature no bony tenderness Pulses:  R and L carotid,radial,femoral,dorsalis pedis and posterior tibial pulses are full and  equal bilaterally Extremities:  No clubbing, cyanosis, edema, or deformity noted with normal full range of motion of all joints.   Neurologic:  sensation intact to light touch, gait normal, and DTRs symmetrical and normal.  strength normal in all extremities.   Skin:  Intact without suspicious lesions or rashes Cervical Nodes:  No lymphadenopathy noted Psych:  normal affect, talkative and pleasant    Impression & Recommendations:  Problem # 1:  ESSENTIAL HYPERTENSION, BENIGN (ICD-401.1) Assessment Unchanged  this is well controlled at home and better on second check  enc to keep exercising and work on weight loss  continue hctz lab reviewed  plan on PE in 6 mo Her updated medication list for this problem includes:    Hydrochlorothiazide 12.5 Mg Tabs (Hydrochlorothiazide) .Marland Kitchen... Take 1 tab  by mouth every morning  BP today: 140/82 Prior BP: 152/94 (02/11/2010)  Labs Reviewed: K+: 4.1 (02/11/2010) Creat: : 1.0 (02/11/2010)   Chol: 191 (02/11/2010)   HDL: 60.50 (02/11/2010)   LDL: 112 (02/11/2010)   TG: 95.0 (02/11/2010)  Problem # 2:  HYPERLIPIDEMIA (ICD-272.4) Assessment: Unchanged  this is well controlled with diet  rev labs with pt  rev low sat fat diet enc to keep up the exercise  PE in 6 mo  Labs Reviewed: SGOT: 24 (02/11/2010)   SGPT: 9 (02/11/2010)   HDL:60.50 (02/11/2010), 46.8 (08/20/2008)  LDL:112 (02/11/2010), 112 (08/20/2008)  Chol:191 (02/11/2010), 180 (08/20/2008)  Trig:95.0 (02/11/2010), 107 (08/20/2008)  Problem # 3:  BACK PAIN (ICD-724.5) Assessment: New mild intermittent pain and spasm in upper R lumbar back and L chest wall  no neurol changes asympt today nl rom today disc cross training to avoid overwork one muscle group and disc yoga for stretching as well as use of heat adv to f/u if no improvement   Complete Medication List: 1)  Multivitamins Tabs (Multiple vitamin) .... Take one by mouth once a day 2)  Hydrochlorothiazide 12.5 Mg Tabs  (Hydrochlorothiazide) .... Take 1 tab  by mouth every morning  Patient Instructions: 1)  keep working on core strength  2)  do different exercise on different days -- to cross train and avoid overworking any one muscle group  3)  try some yoga 4)  try heat on sore areas 5)  update me if not improving  6)  no change in medicines  7)  labs are good - try to stick with low salt/ fat diet  8)  schedule fasting lab and then check up in 6 months -- wellness/ lipid v70.0 401.1   Orders Added: 1)  Est. Patient Level IV [47829]    Current Allergies (reviewed today): No known allergies

## 2010-11-10 NOTE — Assessment & Plan Note (Signed)
Summary: PAP SMEAR AND CPX/CLE   Vital Signs:  Patient Profile:   50 Years Old Female Height:     66 inches Weight:      167 pounds Temp:     98.0 degrees F oral Pulse rate:   72 / minute Pulse rhythm:   regular BP sitting:   144 / 90  (left arm) Cuff size:   regular  Vitals Entered By: Sydell Axon (November 09, 2007 9:57 AM)                 Chief Complaint:  30 minute checkup and had a pap 2 weeks ago with GYN.  History of Present Illness: had pap and gyn visit 2 weeks ago sent for [place]- has 3 fibroid tumors- f/u in Feb and make plan- also will be removing IUD nl mam in 12/08- no lumps on self exam or gyn exam  been feeling fairly well  still gets a random pain in L side, but overall better really wants to loose wt has decided to do 1/2 marathon walk- run in June - for [employer] is building up gradually- training schedule- every day also is getting away from eating processed foods  bp is highest ever today has fam hx of HTN needs labs- expect chol to be imp- with better diet       Current Allergies (reviewed today): No known allergies   Past Medical History:    Hyperlipidemia    uterine fibroids  Past Surgical History:    Knee surgery (2002)    Abn. pap- endometrial cells, biopsy o.k. (2006)    IUD removed (2006)    pelvic US 1/09 uterine fibroids   Family History:    Father: glaucoma    Mother: heart disease, MI, smoker, thyroid probs, chol ,HTN    Siblings:     daughter goiter   Risk Factors:  PAP Smear History:     Date of Last PAP Smear:  10/12/2007    Results:  normal    Review of Systems      See HPI  General      Denies malaise and weight loss.  Eyes      Denies blurring.  CV      Denies chest pain or discomfort and palpitations.  Resp      Denies cough and shortness of breath.  GI      Denies bloody stools and change in bowel habits.  MS      occ knees hurt- not bad lately  Derm      Denies rash.  Psych    mood is good  Endo      Denies excessive thirst and excessive urination.   Physical Exam  General:     Well-developed,well-nourished,in no acute distress; alert,appropriate and cooperative throughout examination Head:     normocephalic, atraumatic, and no abnormalities observed.   Eyes:     vision grossly intact, pupils equal, pupils round, and pupils reactive to light.   Ears:     R ear normal and L ear normal.   Nose:     no nasal discharge.   Mouth:     pharynx pink and moist.   Neck:     supple with full rom and no masses or thyromegally, no JVD or carotid bruit  Lungs:     Normal respiratory effort, chest expands symmetrically. Lungs are clear to auscultation, no crackles or wheezes. Heart:     Normal rate and regular rhythm. S1  and S2 normal without gallop, murmur, click, rub or other extra sounds. Abdomen:     Bowel sounds positive,abdomen soft and non-tender without masses, organomegaly or hernias noted.  no renal bruits Msk:     No deformity or scoliosis noted of thoracic or lumbar spine.   no acute joint changes Pulses:     R and L carotid,radial,femoral,dorsalis pedis and posterior tibial pulses are full and equal bilaterally Extremities:     No clubbing, cyanosis, edema, or deformity noted with normal full range of motion of all joints.   Neurologic:     sensation intact to light touch, gait normal, and DTRs symmetrical and normal.   Skin:     Intact without suspicious lesions or rashes Cervical Nodes:     No lymphadenopathy noted Inguinal Nodes:     No significant adenopathy Psych:     nl affect, cheerful    Impression & Recommendations:  Problem # 1:  HEALTH MAINTENANCE EXAM (ICD-V70.0) reviewed health habits including diet, exercise and skin cancer prevention reviewed health maintenance list and family history wellness labs today will discuss colon cancer screening after age 95 is utd on gyn care  Orders: Venipuncture ([address]) TLB-Lipid  Panel (80061-LIPID) TLB-BMP (Basic Metabolic Panel-BMET) (80048-METABOL) TLB-CBC Platelet - w/Differential (85025-CBCD) TLB-Hepatic/Liver Function Pnl (80076-HEPATIC) TLB-TSH (Thyroid Stimulating Hormone) (84443-TSH)   Problem # 2:  ELEVATED BLOOD PRESSURE WITHOUT DIAGNOSIS OF HYPERTENSION (ICD-796.2) will start exercise training regimen and continue good diet f/u in 2 mo- if bp is still elevated may have essential htn and disc tx  Problem # 3:  HYPERLIPIDEMIA (ICD-272.4) expect imp today with better diet and exercise Orders: Venipuncture ([address]) TLB-Lipid Panel (80061-LIPID)   Complete Medication List: 1)  Multivitamins Tabs (Multiple vitamin) .... Take one by mouth once a day   Patient Instructions: 1)  pt can raise HDL (good cholesterol) by increasing exercise and taking omega 3 supplement like fish oil and flax seed oil 2)  can lower LDL by eating lower saturated fat diet 3)  the current recommendation for calcium intake is 1200-1500 mg daily with [phone] IU of vitamin D 4)  follow up in 2 months for blood pressure visit after starting diet and exercise    ] Current Allergies (reviewed today): No known allergies    Preventive Care Screening  Pap Smear:    Date:  10/12/2007    Results:  normal

## 2010-11-10 NOTE — Miscellaneous (Signed)
Summary: mammo results  Clinical Lists Changes  Observations: Added new observation of MAMMO DUE: 06/2008 (09/18/2007 8:17) Added new observation of MAMMOGRAM: normal (09/18/2007 8:17)       Preventive Care Screening  Mammogram:    Date:  09/18/2007    Next Due:  06/2008    Results:  normal

## 2010-11-10 NOTE — Assessment & Plan Note (Signed)
Summary: FOLLOW UP   Vital Signs:  Patient Profile:   50 Years Old Female Height:     66 inches Weight:      168 pounds Temp:     98.1 degrees F oral Pulse rate:   84 / minute Pulse rhythm:   regular BP sitting:   160 / 96  (left arm) Cuff size:   large  Vitals Entered By: Lowella Petties (Feb 19, 2008 9:05 AM)                 Chief Complaint:  Follow up.  History of Present Illness: feels fine  has bp cuff and has been taking at night usually from 120s over 80-90  IUD is gone- and not on any hormonal OC works out 3-4 times per week  some stress- mother in hospital last week- in her 38s  trying to be more active and avoid salt in diet   180/108 on her cuff today in office     Current Allergies: No known allergies   Past Medical History:    Reviewed history from 01/08/2008 and no changes required:       Hyperlipidemia       uterine fibroids       elevated bp - whitecoat  Past Surgical History:    Reviewed history from 11/09/2007 and no changes required:       Knee surgery (2002)       Abn. pap- endometrial cells, biopsy o.k. (2006)       IUD removed (2006)       pelvic US 1/09 uterine fibroids   Family History:    Reviewed history from 11/09/2007 and no changes required:       Father: glaucoma       Mother: heart disease, MI, smoker, thyroid probs, chol ,HTN       Siblings:        daughter goiter  Social History:    Reviewed history from 10/09/2007 and no changes required:       Marital Status: single       Children: 2 daughters       Occupation: [employer]       non smoker       rare alcohol    Review of Systems  General      Denies fatigue, fever, and loss of appetite.  Eyes      Denies blurring.  CV      Denies chest pain or discomfort, palpitations, and shortness of breath with exertion.  Resp      Denies cough and shortness of breath.  GI      Denies change in bowel habits.  Derm      Denies rash.  Neuro      Denies  numbness.  Psych      Complains of anxiety.      stressed lately, but doing ok   Endo      Denies excessive hunger, excessive thirst, and heat intolerance.    Physical Exam  General:     Well-developed,well-nourished,in no acute distress; alert,appropriate and cooperative throughout examination Head:     normocephalic, atraumatic, and no abnormalities observed.   Eyes:     vision grossly intact, pupils equal, pupils round, and pupils reactive to light.   Neck:     supple with full rom and no masses or thyromegally, no JVD or carotid bruit  Lungs:     Normal respiratory effort, chest expands symmetrically. Lungs are clear to  auscultation, no crackles or wheezes. Heart:     Normal rate and regular rhythm. S1 and S2 normal without gallop, murmur, click, rub or other extra sounds. Abdomen:     no renal bruits  Pulses:     R and L carotid,radial,femoral,dorsalis pedis and posterior tibial pulses are full and equal bilaterally Extremities:     No clubbing, cyanosis, edema, or deformity noted with normal full range of motion of all joints.   Neurologic:     gait normal and DTRs symmetrical and normal.  no tremor  Skin:     Intact without suspicious lesions or rashes Cervical Nodes:     No lymphadenopathy noted Psych:     normal affect, talkative and pleasant     Impression & Recommendations:  Problem # 1:  ELEVATED BLOOD PRESSURE WITHOUT DIAGNOSIS OF HYPERTENSION (ICD-796.2) Assessment: Unchanged with great lifestyle habits including exercise  white coat component- with much lower bp outside the office (and anx in office) will continue to check it on accurate meter at home  f/u 6 mo  BP today: 160/96 Prior BP: 176/102 (01/08/2008)  Labs Reviewed: Creat: 1.1 (11/09/2007) Chol: 176 (11/09/2007)   HDL: 54.2 (11/09/2007)   LDL: 111 (11/09/2007)   TG: 55 (11/09/2007)  Instructed in low sodium diet (DASH Handout) and behavior modification.    Complete Medication  List: 1)  Multivitamins Tabs (Multiple vitamin) .... Take one by mouth once a day   Patient Instructions: 1)  keep up the good work with diet and exercise and avoiding salt 2)  check bp at home while relaxed 2-3 times per week 3)  follow up with me 6 months    ]

## 2010-11-10 NOTE — Miscellaneous (Signed)
Summary: [facility]  Clinical Lists Changes  Medications: Added new medication of MOVIPREP 100 GM  SOLR (PEG-KCL-NACL-NASULF-NA ASC-C) As per prep instructions. - Signed Rx of MOVIPREP 100 GM  SOLR (PEG-KCL-NACL-NASULF-NA ASC-C) As per prep instructions.;  #1 x 0;  Signed;  Entered by: Ezra Sites RN;  Authorized by: Hart Carwin;  Method used: Electronically to [facility]  [address] [place], [place]  [address], Ph: [phone] or [phone], Fax: [phone] Observations: Added new observation of NKA: T (02/28/2009 8:23)    Prescriptions: MOVIPREP 100 GM  SOLR (PEG-KCL-NACL-NASULF-NA ASC-C) As per prep instructions.  #1 x 0   Entered by:   Ezra Sites RN   Authorized by:   Hart Carwin   Signed by:   Ezra Sites RN on 02/28/2009   Method used:   [facility]ronically to        [facility]  [address] (retail)       [address]       [place], [place]  [address]       Ph: [phone] or [phone]       Fax: [phone]   RxID:   [id number]

## 2010-11-10 NOTE — Assessment & Plan Note (Signed)
Summary: 6 M F/U DLO   Vital Signs:  Patient profile:   50 year old female Height:      66 inches Weight:      163 pounds BMI:     26.40 Temp:     97.9 degrees F oral Pulse rate:   80 / minute Pulse rhythm:   regular BP sitting:   144 / 100  (left arm) Cuff size:   large  Vitals Entered By: Liane Comber (Feb 18, 2009 8:56 AM)  Serial Vital Signs/Assessments:  Time      Position  BP       Pulse  Resp  Temp     By                     148/90                         Judith Part MD   History of Present Illness: here for multiple med issues   wt is down 4 lb is eating healthy  is exercising - in a spinning class and also going to [facility] with some cardio and wts  has to further cut eating sweets   bp is high today- usually much lower at home-- is always high in office 125-129 systolic and 89-90 diastolic  is taking good care of herself  labs earlier-- lipids not too bad trig 103/ HDL 46 and LDL 112 (goal to get below 100) diet - is overall getting better   occ gets a little discomfort under L ribs -- sore in back worse with stress  not exertional  is worse if she lies on her side  tender when she presses on it, no skin change  thinks this is from remote fall down steps -- long time ago on that side   occ back spasm -- ? from mattress   is interested in screen colonosc -- no changes in stool or blood no fam hx        Allergies: No Known Drug Allergies  Past History:  Past Medical History:    Hyperlipidemia    uterine fibroids    elevated bp - whitecoat    [facility]-- [facility]      (08/20/2008)  Past Surgical History:    Knee surgery (2002)    Abn. pap- endometrial cells, biopsy o.k. (2006)    IUD removed (2006)    pelvic US 1/09 uterine fibroids     (11/09/2007)  Family History:    Father: glaucoma    Mother: heart disease, MI with (stent), smoker, thyroid probs, chol ,HTN    Siblings:     daughter goiter     (08/20/2008)  Social History:    Marital Status: single    Children: 2 daughters    Occupation: [employer]    non smoker    rare alcohol    regular exercise      (02/18/2009)  Social History:    Marital Status: single    Children: 2 daughters    Occupation: [employer]    non smoker    rare alcohol    regular exercise   Review of Systems General:  Denies fatigue, fever, loss of appetite, and malaise. Eyes:  Denies blurring. CV:  Denies chest pain or discomfort, palpitations, and shortness of breath with exertion. Resp:  Denies cough and wheezing. GI:  Denies abdominal pain, bloody stools, change in bowel habits, indigestion, and loss of appetite.  GU:  Denies discharge and dysuria. MS:  Complains of low back pain; more stiffness than pain - on and off . Derm:  Denies itching and rash. Neuro:  Denies numbness and tingling. Psych:  mood is generally good . Endo:  Denies excessive thirst and excessive urination.  Physical Exam  General:  Well-developed,well-nourished,in no acute distress; alert,appropriate and cooperative throughout examination Head:  normocephalic, atraumatic, and no abnormalities observed.   Eyes:  vision grossly intact, pupils equal, pupils round, and pupils reactive to light.   Mouth:  pharynx pink and moist.   Neck:  supple with full rom and no masses or thyromegally, no JVD or carotid bruit  Lungs:  Normal respiratory effort, chest expands symmetrically. Lungs are clear to auscultation, no crackles or wheezes. Heart:  Normal rate and regular rhythm. S1 and S2 normal without gallop, murmur, click, rub or other extra sounds. Abdomen:  soft, non-tender, normal bowel sounds, no distention, and no masses.   no L rib or abd tenderness  Msk:  no LS tenderness  nl rom  no CVA tenderness  Pulses:  R and L carotid,radial,femoral,dorsalis pedis and posterior tibial pulses are full and equal bilaterally Extremities:  No clubbing, cyanosis, edema, or deformity noted with normal full range of motion of all  joints.   Neurologic:  sensation intact to light touch, gait normal, and DTRs symmetrical and normal.   Skin:  Intact without suspicious lesions or rashes Cervical Nodes:  No lymphadenopathy noted Inguinal Nodes:  No significant adenopathy Psych:  normal affect, talkative and pleasant    Impression & Recommendations:  Problem # 1:  ELEVATED BLOOD PRESSURE WITHOUT DIAGNOSIS OF HYPERTENSION (ICD-796.2) Assessment Unchanged bp continues to be high here and well controlled at home disc healthy diet (low simple sugar/ choose complex carbs/ low sat fat) diet and exercise in detail  labs rev today  will alert [place] if bp is above 140/90 otherwise f/u 1 year  Problem # 2:  SPECIAL SCREENING FOR MALIGNANT NEOPLASMS COLON (ICD-V76.51) Assessment: Comment Only refer for screening colonoscopy pt over 50 no fam hx  no stool changes  Orders: Gastroenterology Referral (GI)  Problem # 3:  BACK PAIN (ICD-724.5) Assessment: New back and occas side pain L  now is fine  disc poss muscle spasm- perhaps try diff mattress  update if worse or persistant  Problem # 4:  HYPERLIPIDEMIA (ICD-272.4) Assessment: Improved fair lipids with LDL 112 disc avoiding sat fats in diet  Labs Reviewed: SGOT: 18 (08/20/2008)   SGPT: 10 (08/20/2008)   HDL:46.8 (08/20/2008), 54.2 (11/09/2007)  LDL:112 (08/20/2008), 111 (11/09/2007)  Chol:180 (08/20/2008), 176 (11/09/2007)  Trig:107 (08/20/2008), 55 (11/09/2007)  Complete Medication List: 1)  Multivitamins Tabs (Multiple vitamin) .... Take one by mouth once a day  Patient Instructions: 1)  we will refer you for screening colonoscopy at check out  2)  keep working on healthy diet and exercise  3)  follow up 1 year  4)  update if back or side pain return or persist   Preventive Care Screening  Pap Smear:    Date:  12/23/2008    Results:  normal

## 2010-11-10 NOTE — Assessment & Plan Note (Signed)
Summary: DISCOMFORT IN ABDOMINAL AREA/BIR   Vital Signs:  Patient Profile:   50 Years Old Female Weight:      169 pounds Temp:     97.9 degrees F oral Pulse rate:   72 / minute Pulse rhythm:   regular BP sitting:   135 / 90  (left arm) Cuff size:   large  Vitals Entered By: Lowella Petties (October 09, 2007 4:29 PM)                 Chief Complaint:  Discomfort in left abd.  History of Present Illness: some discomfort in L upper abdomen- is a tight feeling not severe- just more uncomfortable, like little spasms is worse when sitting up- better when lying down  did have a fall down steps this summer  lots of stress- daughter in HS- lots of driving took a belly dancing class - was bigger obligation than she thought- ? pulled something was a hectic time for her  stomach sometimes gurgles- no other gi complaints    Current Allergies (reviewed today): No known allergies      Review of Systems      See HPI  General      Denies chills, fatigue, fever, and loss of appetite.  CV      Denies chest pain or discomfort, palpitations, and shortness of breath with exertion.  Resp      Denies cough, pleuritic, and shortness of breath.  GI      Complains of gas.      Denies bloody stools, change in bowel habits, indigestion, loss of appetite, nausea, and vomiting.      tends to be gassy  GU      Complains of discharge.      Denies dysuria, hematuria, and urinary frequency.      no chance pregnant does have mirina IUD- since Jan- feels a little crampy- has f/u with gyn 1/19 some vaginal d/c- poss from iud no worries about stds  Derm      Denies rash.  Psych      has been really stressed   Physical Exam  General:     Well-developed,well-nourished,in no acute distress; alert,appropriate and cooperative throughout examination Head:     normocephalic, atraumatic, and no abnormalities observed.   Mouth:     pharynx pink and moist.   Neck:     No  deformities, masses, or tenderness noted. Lungs:     Normal respiratory effort, chest expands symmetrically. Lungs are clear to auscultation, no crackles or wheezes. Heart:     Normal rate and regular rhythm. S1 and S2 normal without gallop, murmur, click, rub or other extra sounds. Abdomen:     soft, non-tender, normal bowel sounds, no distention, no masses, no guarding, no rebound tenderness, no hepatomegaly, and no splenomegaly.   Msk:     no cva tenderness no TS or LS tenderness Pulses:     R and L carotid,radial,femoral,dorsalis pedis and posterior tibial pulses are full and equal bilaterally Neurologic:     strength normal in all extremities, sensation intact to pinprick, gait normal, and DTRs symmetrical and normal.   Skin:     Intact without suspicious lesions or rashes Cervical Nodes:     No lymphadenopathy noted Inguinal Nodes:     No significant adenopathy Psych:     nl affect, pleasant     Impression & Recommendations:  Problem # 1:  RIB PAIN, RIGHT SIDED (ICD-786.50) now improved- with nl exam disc  poss of straining muscle with dance class will use heat as needed with stretching- and monitor if worse or pain becomes exertional- will update if no imp in 2-4 weeks- adv to update f/u in jan with gyn and also here for health mt   Patient Instructions: 1)  use some heat on your side when it feels tight 2)  try some stretching 3)  if pain increases, or becomes exertional- or any other changes- update me 4)  if not improved in 2-4 weeks - update me 5)  follow up with gyn as planned    ] Prior Medications (reviewed today): None Current Allergies (reviewed today): No known allergies

## 2010-11-12 NOTE — Miscellaneous (Signed)
Summary: Mammogram update  Clinical Lists Changes  Observations: Added new observation of MAMMO DUE: 10/2011 (10/06/2010 13:29) Added new observation of MAMMOGRAM: normal (10/02/2010 13:29)      Preventive Care Screening  Mammogram:    Date:  10/02/2010    Next Due:  10/2011    Results:  normal

## 2010-11-12 NOTE — Letter (Signed)
Summary: Results Follow up Letter  [facility]  [address] [place], [place] [address]   Phone: [phone]  Fax: [phone]    10/06/2010 MRN: [id number]  [name] [address] [place], [place]  [address]  Dear [name],  The following are the results of your recent test(s):  Test         Result    Pap Smear:        Normal _____  Not Normal _____ Comments: ______________________________________________________ Cholesterol: LDL(Bad cholesterol):         Your goal is less than:         HDL (Good cholesterol):       Your goal is more than: Comments:  ______________________________________________________ Mammogram:        Normal __X___  Not Normal _____ Comments: Repeat in 1 year  ___________________________________________________________________ Hemoccult:        Normal _____  Not normal _______ Comments:    _____________________________________________________________________ Other Tests:    We routinely do not discuss normal results over the telephone.  If you desire a copy of the results, or you have any questions about this information we can discuss them at your next office visit.   Sincerely,      Sharilyn Sites for Dr. Roxy Manns

## 2010-12-19 ENCOUNTER — Encounter: Payer: Self-pay | Admitting: Family Medicine

## 2010-12-29 ENCOUNTER — Encounter: Payer: Self-pay | Admitting: Obstetrics & Gynecology

## 2010-12-29 ENCOUNTER — Ambulatory Visit (INDEPENDENT_AMBULATORY_CARE_PROVIDER_SITE_OTHER): Payer: Federal, State, Local not specified - PPO | Admitting: Obstetrics and Gynecology

## 2010-12-29 ENCOUNTER — Other Ambulatory Visit: Payer: Self-pay | Admitting: Obstetrics & Gynecology

## 2010-12-29 DIAGNOSIS — N951 Menopausal and female climacteric states: Secondary | ICD-10-CM

## 2010-12-29 DIAGNOSIS — Z1272 Encounter for screening for malignant neoplasm of vagina: Secondary | ICD-10-CM

## 2010-12-29 DIAGNOSIS — Z01419 Encounter for gynecological examination (general) (routine) without abnormal findings: Secondary | ICD-10-CM

## 2010-12-29 LAB — CONVERTED CEMR LAB
FSH: 71.4 milliintl units/mL
TSH: 2.09 microintl units/mL (ref 0.350–4.500)

## 2011-01-12 NOTE — Assessment & Plan Note (Signed)
NAMESANTINA, [name] Gibbs               ACCOUNT NO.:  [id number]  MEDICAL RECORD NO.:  [id number]           PATIENT TYPE:  LOCATION:  [facility]           FACILITY:  PHYSICIAN:  Jaynie Collins, MD          DATE OF BIRTH:  DATE OF SERVICE:  12/29/2010                                 CLINIC NOTE  REASON FOR VISIT:  Annual examination.  HISTORY OF PRESENT ILLNESS:  [name] is a 50 year old gravida 2, para 2 perimenopausal female with a last menstrual period of November 22, 2010, who is here for annual examination.  The patient was last seen in March 2011 and at that visit was complaining of feeling not well and having anxiety.  She was prescribed some Zoloft, but she said she did not end up taking this medication as she was able to work through her anxiety.  The patient does say that she has been having some increasing hot flashes and night sweats and some mood swings and also is having some weight gain.  She does wonder if all of this is part of the perimenopausal spectrum.  She denies any other symptoms and has no other concerns.  PAST OBSTETRIC AND GYNECOLOGIC HISTORY:  The patient has had 2 vaginal deliveries.  She has had menstrual cycles that sometimes skip a month over the past year but no intermenstrual bleeding.  She did have a ParaGard IUD that was placed in December 2009 for contraception.  Her last mammogram was in December 2011 and was normal.  She does have a history of cervical dysplasia, status post cryotherapy in 1982.  Since then, she has had normal Pap smears and the last being in March of 2011.  PAST MEDICAL HISTORY:  Hypertension for which she is on medication.  She does report that her blood pressure is mainly white coat hypertension.  PAST SURGICAL HISTORY:  Knee surgery in 2002.  MEDICATIONS:  Multivitamin, ParaGard IUD, and hydrochlorothiazide 12.5 mg daily.  ALLERGIES:  No known drug allergies.  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  She denies any tobacco, alcohol, or illicit drug use.  She also denies any physical or sexual abuse.  The patient is not sexually active, and she exercises regularly about 5 times a week.  FAMILY HISTORY:  Remarkable for hypertension.  No gynecologic colon or breast cancers.  REVIEW OF SYSTEMS:  Fourteen-point review of system is reviewed and she is negative apart from what was mentioned in the first paragraph.  PHYSICAL EXAMINATION:  VITAL SIGNS:  Blood pressure 131/88, pulse 75, weight 171 pound, height 5 feet 6 inches. GENERAL:  No apparent distress. HEENT:  Normocephalic and atraumatic. NECK:  Supple.  Normal thyroid normal.  No abnormal masses. BREASTS:  Symmetric in size, soft, nontender, no abnormal masses, skin changes, nipple drainage, or lymphadenopathy. LUNGS:  Clear to auscultation bilaterally. HEART:  Regular rate and rhythm. ABDOMEN:  Soft, nontender, and nondistended.  No organomegaly. EXTREMITIES:  No cyanosis, clubbing, or edema. PELVIC:  Normal external female genitalia.  Pink, well rugated vagina, normal vaginal discharge.  Multiparous cervix noted.  ParaGard strings were noted.  Pap smear was done.  On bimanual  exam, the patient has a normal-sized mobile, anteverted uterus, nontender.  The normal adnexa bilaterally.  ASSESSMENT/PLAN:  The patient is a 50 year old gravida 2, para 2 here for her annual examination.  She had a normal breast examination, and she is up-to-date on her mammogram.  We will follow up her Pap smear results.  As for her perimenopausal symptoms, the patient was told that this was part of the perimenopausal spectrum.  However, given her concern about her weight gain, we will check a TSH and also an FSH just to see if this give [place] a better idea of her menopausal status.  She was told that this is a nonspecific test, but the patient did want to do this test.  We will follow up the results and manage accordingly.  The patient was  told that her symptoms got worse.  One thing that she could try is to be on the Zoloft medication, as this would help with her perimenopausal symptoms.  In addition to any anxiety problems as she might already have, the patient is going to think about this, and she will take this medication as needed.  She was told that if her symptoms get worse, she might need a little hormonal therapy.  She is very against doing that for now, and she said if she feels she needs medication, she will take some Zoloft.  She was told to call or come back in for any further gynecologic concerns.          ______________________________ Jaynie Collins, MD    UA/MEDQ  D:  12/29/2010  T:  12/30/2010  Job:  [id number]

## 2011-02-04 ENCOUNTER — Encounter: Payer: Self-pay | Admitting: Family Medicine

## 2011-02-04 ENCOUNTER — Ambulatory Visit (INDEPENDENT_AMBULATORY_CARE_PROVIDER_SITE_OTHER): Payer: Federal, State, Local not specified - PPO | Admitting: Family Medicine

## 2011-02-04 VITALS — BP 116/78 | HR 80 | Temp 98.1°F | Ht 66.0 in | Wt 176.0 lb

## 2011-02-04 DIAGNOSIS — R21 Rash and other nonspecific skin eruption: Secondary | ICD-10-CM | POA: Insufficient documentation

## 2011-02-04 MED ORDER — VALACYCLOVIR HCL 1 G PO TABS: 1000.0000 mg | ORAL_TABLET | Freq: Three times a day (TID) | ORAL | Status: AC

## 2011-02-04 NOTE — Patient Instructions (Signed)
We will cover you for shingles.  Take valtrex 1 gm three times a day for 7 days. Update [place] if not improving as expected.

## 2011-02-04 NOTE — Progress Notes (Signed)
  Subjective:    Patient ID: [name], female    DOB: 28-Feb-1961, 50 y.o.   MRN: [id number]  HPI CC: rash  Noticed new rash about 1 wk ago, R flank region.  Initially thought mosquito bites.  Itchy, burning, tingling.    Tried alcohol, neosporin.  No new detergents, shampoo, soap, lotions.  Was working outside.  No new medicines.  No other rash, f/c, n/v/d, abd pain, oral lesions.  Review of Systems Per HPI    Objective:   Physical Exam  Vitals reviewed. Constitutional: She appears well-developed and well-nourished. No distress.  Skin: Rash noted.          Clusters of papules slightly erythematous, not pruritic.  One on ant abd (5 small papules) and one on R flank (10 small papules).  nonvesicular          Assessment & Plan:

## 2011-02-04 NOTE — Assessment & Plan Note (Signed)
Doesn't look like typical shingles.  However, h/o chicken pox, unilateral along dermatome, burning/painful more than itching. Pt prefers to cover with valtrex to avoid PHN. Alternatively could be bug bites.

## 2011-02-08 ENCOUNTER — Other Ambulatory Visit: Payer: Self-pay | Admitting: Family Medicine

## 2011-02-08 DIAGNOSIS — I10 Essential (primary) hypertension: Secondary | ICD-10-CM

## 2011-02-08 DIAGNOSIS — E785 Hyperlipidemia, unspecified: Secondary | ICD-10-CM

## 2011-02-11 ENCOUNTER — Other Ambulatory Visit (INDEPENDENT_AMBULATORY_CARE_PROVIDER_SITE_OTHER): Payer: Federal, State, Local not specified - PPO

## 2011-02-11 DIAGNOSIS — E785 Hyperlipidemia, unspecified: Secondary | ICD-10-CM

## 2011-02-11 DIAGNOSIS — I10 Essential (primary) hypertension: Secondary | ICD-10-CM

## 2011-02-11 LAB — CBC WITH DIFFERENTIAL/PLATELET
Basophils Absolute: 0 10*3/uL (ref 0.0–0.1)
Basophils Relative: 0.3 % (ref 0.0–3.0)
Eosinophils Absolute: 0.1 10*3/uL (ref 0.0–0.7)
Eosinophils Relative: 0.9 % (ref 0.0–5.0)
HCT: 39.4 % (ref 36.0–46.0)
Hemoglobin: 13.6 g/dL (ref 12.0–15.0)
Lymphocytes Relative: 29.4 % (ref 12.0–46.0)
Lymphs Abs: 1.9 10*3/uL (ref 0.7–4.0)
MCHC: 34.5 g/dL (ref 30.0–36.0)
MCV: 99 fl (ref 78.0–100.0)
Monocytes Absolute: 0.5 10*3/uL (ref 0.1–1.0)
Monocytes Relative: 8 % (ref 3.0–12.0)
Neutro Abs: 3.9 10*3/uL (ref 1.4–7.7)
Neutrophils Relative %: 61.4 % (ref 43.0–77.0)
Platelets: 221 10*3/uL (ref 150.0–400.0)
RBC: 3.98 Mil/uL (ref 3.87–5.11)
RDW: 12.8 % (ref 11.5–14.6)
WBC: 6.4 10*3/uL (ref 4.5–10.5)

## 2011-02-11 LAB — LIPID PANEL
Cholesterol: 215 mg/dL — ABNORMAL HIGH (ref 0–200)
HDL: 65.3 mg/dL (ref >39.00)
Total CHOL/HDL Ratio: 3
Triglycerides: 118 mg/dL (ref 0.0–149.0)
VLDL: 23.6 mg/dL (ref 0.0–40.0)

## 2011-02-11 LAB — COMPREHENSIVE METABOLIC PANEL
ALT: 11 U/L (ref 0–35)
AST: 20 U/L (ref 0–37)
Albumin: 3.8 g/dL (ref 3.5–5.2)
Alkaline Phosphatase: 62 U/L (ref 39–117)
BUN: 12 mg/dL (ref 6–23)
CO2: 27 mEq/L (ref 19–32)
Calcium: 9.4 mg/dL (ref 8.4–10.5)
Chloride: 99 mEq/L (ref 96–112)
Creatinine, Ser: 1.2 mg/dL (ref 0.4–1.2)
GFR: 63.64 mL/min (ref >60.00)
Glucose, Bld: 94 mg/dL (ref 70–99)
Potassium: 3.8 mEq/L (ref 3.5–5.1)
Sodium: 135 mEq/L (ref 135–145)
Total Bilirubin: 1 mg/dL (ref 0.3–1.2)
Total Protein: 7.2 g/dL (ref 6.0–8.3)

## 2011-02-11 LAB — LDL CHOLESTEROL, DIRECT: Direct LDL: 129.8 mg/dL

## 2011-02-11 LAB — TSH: TSH: 2.23 u[IU]/mL (ref 0.35–5.50)

## 2011-02-17 ENCOUNTER — Encounter: Payer: Self-pay | Admitting: Family Medicine

## 2011-02-17 ENCOUNTER — Ambulatory Visit (INDEPENDENT_AMBULATORY_CARE_PROVIDER_SITE_OTHER): Payer: Federal, State, Local not specified - PPO | Admitting: Family Medicine

## 2011-02-17 DIAGNOSIS — I10 Essential (primary) hypertension: Secondary | ICD-10-CM

## 2011-02-17 DIAGNOSIS — E785 Hyperlipidemia, unspecified: Secondary | ICD-10-CM

## 2011-02-17 MED ORDER — HYDROCHLOROTHIAZIDE 12.5 MG PO CAPS: 12.5000 mg | ORAL_CAPSULE | ORAL | Status: DC

## 2011-02-17 NOTE — Assessment & Plan Note (Signed)
Lipids are up - diet not as good Disc low sat fat and low sugar diet  Disc goals for chol and rev labs Would like LDL under 130  HDL is good

## 2011-02-17 NOTE — Patient Instructions (Signed)
Keep taking care of yourself  Continue exercise - focus on core strength for you back  Avoid red meat/ fried foods/ egg yolks/ fatty breakfast meats/ butter, cheese and high fat dairy/ and shellfish  /peanuts  Cut calories of weight loss or maintenance

## 2011-02-17 NOTE — Assessment & Plan Note (Signed)
Well controlled hctz refilled Labs reviewed Enc exercise and wt loss

## 2011-02-17 NOTE — Progress Notes (Signed)
Subjective:    Patient ID: [name], female    DOB: [birth year], 50 y.o.   MRN: [id number]  HPI Here for f/u of HTN and hyperlipidemia  Feeling good overall   2 weeks ago had a rash on her side - was itchy and painful --thinks it was shingles  No more pain and the rash is better  Thankful for that    Lipids are up with LDL 129 from 112 Good HDL of 65- that went up Lab Results  Component Value Date   CHOL 215* 02/11/2011   CHOL 191 02/11/2010   CHOL 180 08/20/2008   Lab Results  Component Value Date   HDL 65.30 02/11/2011   HDL 60.50 02/11/2010   HDL 46.8 08/20/2008   Lab Results  Component Value Date   LDLCALC 112* 02/11/2010   LDLCALC 112* 08/20/2008   LDLCALC 111* 11/09/2007   Lab Results  Component Value Date   TRIG 118.0 02/11/2011   TRIG 95.0 02/11/2010   TRIG 107 08/20/2008   Lab Results  Component Value Date   CHOLHDL 3 02/11/2011   CHOLHDL 3 02/11/2010   CHOLHDL 3.8 CALC 08/20/2008   Lab Results  Component Value Date   LDLDIRECT 129.8 02/11/2011   Diet is not always optimal -- really craves sugar  Is trying to eat fruit instead  A lot of nuts  Tries not to eat sat fats  In gym 3-4 times per week   Mother had high chol     Wt is up 2 lb- fairly stable  HTN in good control with hctz bp is 118/78 today  No cp or edema or headaches   Past Medical History  Diagnosis Date  . Hyperlipidemia   . Hypertension     with some white coat component  . Fibroids     uterine  . Abnormal finding on Pap smear 2006    endometrial cells, biopsy ok  . History of pelvic ultrasound 10/2007    uterine fibroids    History   Social History  . Marital Status: Single    Spouse Name: N/A    Number of Children: 2  . Years of Education: N/A   Occupational History  . [employer]    Social History Main Topics  . Smoking status: Never Smoker   . Smokeless tobacco: Never Used  . Alcohol Use: No  . Drug Use: No  . Sexually Active: Not on file   Other Topics Concern  . Not on  file   Social History Narrative   Exercises 4 x a week, cardio and strength training      Review of Systems Review of Systems  Constitutional: Negative for fever, appetite change, fatigue and unexpected weight change.  Eyes: Negative for pain and visual disturbance.  Respiratory: Negative for cough and shortness of breath.   Cardiovascular: Negative for cp or edema  Gastrointestinal: Negative for nausea, diarrhea and constipation.  Genitourinary: Negative for urgency and frequency.  Skin: Negative for pallor.  Neurological: Negative for weakness, light-headedness, numbness and headaches.  Hematological: Negative for adenopathy. Does not bruise/bleed easily.  Psychiatric/Behavioral: Negative for dysphoric mood. The patient is not nervous/anxious.         Objective:   Physical Exam  Constitutional: She appears well-developed and well-nourished. No distress.  HENT:  Head: Normocephalic and atraumatic.  Eyes: Conjunctivae and EOM are normal. Pupils are equal, round, and reactive to light.  Neck: Normal range of motion. Neck supple. No JVD present. Carotid bruit is  not present. No thyromegaly present.  Cardiovascular: Normal rate, regular rhythm and normal heart sounds.   Pulmonary/Chest: Effort normal and breath sounds normal. No respiratory distress. She has no wheezes.  Abdominal: Soft. Bowel sounds are normal. She exhibits no distension and no mass. There is no tenderness.  Musculoskeletal: Normal range of motion. She exhibits no edema and no tenderness.  Lymphadenopathy:    She has no cervical adenopathy.  Neurological: She is alert. She has normal reflexes. No cranial nerve deficit. Coordination normal.  Skin: Skin is warm and dry. No rash noted. No erythema. No pallor.  Psychiatric: She has a normal mood and affect.          Assessment & Plan:

## 2011-02-18 ENCOUNTER — Encounter: Payer: Self-pay | Admitting: Family Medicine

## 2011-02-23 NOTE — Assessment & Plan Note (Signed)
[name], [name]                ACCOUNT NO.:  [id number]   MEDICAL RECORD NO.:  [id number]          PATIENT TYPE:  POB   LOCATION:  [facility]         FACILITY:  [facility]   PHYSICIAN:  Elsie Lincoln, MD      DATE OF BIRTH:  29-Mar-1961   DATE OF SERVICE:                                  CLINIC NOTE   The patient is a 50 year old female who presents for physical exam.  She  was starting to drop occasional periods.  She had a menses in October,  skipped November and December, and had a menses in January and February,  and has not had one yet in March.  She does not really have hot flashes  or flushes.  She occasionally will sweat.  She is starting to have short  periods of anxiety admitting palpitations.  She has seen her primary  care Dushaun Okey who said her heart was bomb.  I probed a little more in-  depth with this and I think she has feelings of being overwhelmed.  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  She  tries to exercise in the morning at 5:30, go home, change clothes, go to  [facility], work all day, and then come home and try to be mother to her 2  children.  Fortunately, her children are doing well in school.  Her  oldest daughter was ordered a full scholarship at [employer] and [employer].  Fortunately, she has not played with poor behavior out of her children,  but I do think she is trying to over achieve.  When I suggested she be a  little soft on herself and not try to do everything, she broke into  tears, so I do think there is an anxiety component, being overwhelmed,  and she does agree with this.  She is also having problems sleeping.  I  think a trial of SSRI may help to take the edge off and help with some  of her anxiety.  I discussed the risks and side effects of anorgasmia,  sometimes lethargy, and she agrees to these risks.  Otherwise, the  patient seems to be  doing very well.   PAST MEDICAL HISTORY:  Borderline hypertension that seems to be mostly  associated with coming to the doctor, her blood pressure is 138/91  today.   PAST SURGICAL HISTORY:  Knee surgery in 2002.   OB HISTORY:  NSVD x2.  Menarche age 11.  ParaGard IUD in situ.  History  of abnormal Pap with cryo in 1982.  Otherwise, normal Pap smears.   MEDICATIONS:  Centrum vitamin.   ALLERGIES:  None.   SOCIAL HISTORY:  The patient is an Product/process development scientist for [employer].  No tobacco, drugs,  or alcohol.  A 14 review of systems is positive as described above for  anxiety, feeling of being overwhelmed, had palpitations 3 times this  past year.   PHYSICAL EXAMINATION:  VITAL SIGNS:  Blood pressure 138/91, pulse 72,  weight 169, height 5 feet 6 inches.  GENERAL:  Well-nourished, well-developed, in no apparent stress.  HEENT:  Normocephalic, atraumatic.  Good dentition.  Thyroid, no masses.  LUNGS:  Clear to auscultation bilaterally.  HEART:  Regular rate and rhythm.  BREASTS:  No masses.  No skin changes or nipple discharge.  No  lymphadenopathy.  ABDOMEN:  Soft, nontender.  No organomegaly.  No hernia.  GU:  Genitalia, Tanner V.  Vagina pink, normal rugae.  The patient is on  her period, but I can complete the Pap smear.  IUD string seen.  Uterus,  nontender.  Adnexa, no masses, nontender.  Rectovaginal, no masses.  EXTREMITIES:  Nontender.   ASSESSMENT/PLAN:  A 50 year old female perimenopausal with anxiety/being  overwhelmed.  1. Pap smear.  2. Genitalia in December 2010, BIRADS 1.  3. Start Zoloft.  4. Return to clinic in 8 weeks to see how she is doing on this.  5. I suggested the patient exercise at her lunch hour instead of in      the morning to try to give herself some more time during the day.           ______________________________  Elsie Lincoln, MD     KL/MEDQ  D:  12/25/2009  T:  12/26/2009  Job:  [id number]

## 2011-02-23 NOTE — Assessment & Plan Note (Signed)
[name], COTRELL                ACCOUNT NO.:  [id number]   MEDICAL RECORD NO.:  [id number]          PATIENT TYPE:  POB   LOCATION:  [facility]         FACILITY:  [facility]   PHYSICIAN:  Tinnie Gens, MD        DATE OF BIRTH:  [birth year]   DATE OF SERVICE:                                  CLINIC NOTE   CHIEF COMPLAINT:  Follow-up of enlarged uterus.   HISTORY OF PRESENT ILLNESS:  The patient is a 50 year old gravida 2,  para 2 who was previously seen back in mid January for yearly exam. At  that time she wanted her Mirena IUD removed.  She was felt to have  enlarged uterus consistent with fibroids.  Reports some history of  fibroids.  Ultrasound was ordered. Fibroids appear stable largest are 30  cm. Whole uterus is approximately 11/12 week size. Discussed  consequence of this as well as her desire for IUD removal and possible  insertion of a copper IUD.  She is in a relationship but her partner is  far away.  She thinks she can use condoms for birth control.  Really  just desires to have her IUD removed because she is unclear about the  progestin effect and does not think she will have abnormal bleeding.   PHYSICAL EXAMINATION:  VITALS:  The patient's vitals are in her chart.  Blood pressure is mildly elevated at 151/91, weight is 168. Well-  developed,  well-nourished female in no acute distress.  Normal external  female genitalia, BUS normal.  Vagina is pink and rugated.  Cervix is  parous without lesions.   PROCEDURE:  IUD is removed without difficulty.   IMPRESSION:  1. Fibroid uterus.  2. Desire for IUD removal status post removal.   PLAN:  Follow-up for yearly and as needed if she has any bleeding  related to her fibroids.           ______________________________  Tinnie Gens, MD     TP/MEDQ  D:  11/29/2007  T:  11/30/2007  Job:  [id number]

## 2011-02-23 NOTE — Assessment & Plan Note (Signed)
[name], [name]                ACCOUNT NO.:  [id number]   MEDICAL RECORD NO.:  [id number]          PATIENT TYPE:  POB   LOCATION:  [facility] at [facility]         FACILITY:  [facility]   PHYSICIAN:  Johnella Moloney, MD        DATE OF BIRTH:  05/30/1961   DATE OF SERVICE:  05/22/2008                                  CLINIC NOTE   CHIEF COMPLAINT:  Irregular menstrual bleeding.   HISTORY OF PRESENT ILLNESS:  The patient is a 50 year old gravida 2,  para 2 who was last seen on November 29, 2007, for Mirena IUD removal.  The patient at that point reported having irregular spotting on the  Mirena IUD.  She did have a history of fibroid uterus at that time with  a largest noted to be about 3 cm and the whole uterus measuring about 12  weeks' size.  The patient did have a successful IUD removal and since  the IUD was removed, the patient says that she has noticed that her  bleeding has gotten heavier.  According to her records, after the IUD  was removed in November 29, 2007, she had period from December 01, 2007,  to the December 05, 2007; and subsequent periods in was from December 24, 2007, to December 30, 2007; in April, from January 21, 2008, to January 25, 2008; in May from Feb 18, 2008, to Feb 22, 2008; in June, from March 18, 2008, to March 22, 2008; and in July she had no period.  The patient  reports that for the first 3 days of each periods that she has heavy  bleeding, which was associated with clots and she also changed her pad  every 2-3 hours.  She denies any dizziness, lightheadedness, or other  presyncopal symptoms.  The patient is really worried about this  increased bleeding and just wants to be evaluated for it.  Of note, the  patient is also worried about possibly being close to menopause given  that she did miss her cycle last month.  She is not sexually active and  wants full evaluation for these irregular cycles.   PAST MEDICAL HISTORY:  The patient is currently being evaluated for  possible hypertension.   PAST SURGICAL HISTORY:  Knee surgery in 2002.   PAST OB/GYN HISTORY:  Two vaginal deliveries, menarche at age 31, normal  cycles, Mirena IUD placed in February 2008 and removed in February 2009,  current menstrual history as above, history of abnormal Pap in 1982,  status post cryotherapy, otherwise normal Pap smears, last one was in  January 2009.  The patient's last mammogram was in 2008 and was normal.   MEDICATIONS:  The patient is on Centrum multivitamin daily.   ALLERGIES:  No known drug allergies.   FAMILY HISTORY:  Remarkable for hypertension.   SOCIAL HISTORY:  The patient works for the [employer].  She does audits.  She  denies any habits.   REVIEW OF SYSTEMS:  A 14-point review of systems is reviewed and is  negative.   PHYSICAL EXAMINATION:  Deferred at this visit.  However, her vital signs  are notable for blood pressure of  150/92, pulse of 81, weight 166, and  height 5 feet 6 inches.   ASSESSMENT AND PLAN:  The patient is a 50 year old gravida 2, para 2  here with a report of menorrhagia.  The patient just had her IUD removed  in February 2009, and has had concurrent immediate increase in bleeding  afterwards, she was told that this will be expected as Mirena does  reduce the endometrial lining and causes reduced menstrual flow.  However, given that she had also missed a cycle, the patient could also  be perimenopausal which could be also contributing to her irregular  bleeding.  The patient also has fibroids.  On the last ultrasound, none  of them were noted to have a submucosal component.  However, this could  also be contributing to the abnormal bleeding situation.  Given that a  lot of factors could be causing this, the patient was told that she will  need the following analysis:  1. We will check CBC, HCG, TSH, prolactin, LH, FSH, and estradiol      level just to work up on hormonal for perimenopausal etiology of      this bleeding.  We  will also follow up with a pelvic ultrasound to      see if there has been any significant change in her fibroid side or      if there are any submucosal components that could be evaluated.      Given that the patient is 50 year old, an endometrial biopsy is      also recommended to rule out hyperplasia or neoplasia.  She does      not have any risk factors for endometrial cancer, but this always      has to be ruled out.  After discussion with the patient, the      patient is agreeable to plan.  As per management, discussed the      options of replacing the IUD, hormone therapy, endometrial      ablation, or hysterectomy.  The patient was given information to      review at home about all these modalities and all questions were      answered.  We will follow up these labs and a pelvic ultrasound.      The patient will be scheduled for a followup visit during which      these results will be evaluated and an endometrial biopsy will be      performed.           ______________________________  Johnella Moloney, MD     UD/MEDQ  D:  05/22/2008  T:  05/22/2008  Job:  [id number]

## 2011-02-23 NOTE — Assessment & Plan Note (Signed)
[name], [name]                ACCOUNT NO.:  [id number]   MEDICAL RECORD NO.:  [id number]          PATIENT TYPE:  POB   LOCATION:  [facility]         FACILITY:  [facility]   PHYSICIAN:  Tinnie Gens, MD        DATE OF BIRTH:  04-18-1961   DATE OF SERVICE:  10/25/2007                                  CLINIC NOTE   CHIEF COMPLAINT:  Yearly exam.   HISTORY OF PRESENT ILLNESS:  The patient is a 50 year old, gravida 2,  para 2 who was previously seen for IUD placement back in February of  this year.  She has Mirena in place and does not like it.  She has some  abnormal bleeding and some irregular bleeding.  She would like to go  back to a copper IUD, if possible.  Otherwise, the patient is without  significant complaints.  She seems to have regular cycles.  There are  less in volume.   PAST MEDICAL HISTORY:  Negative.   PAST SURGICAL HISTORY:  Knee surgery in 2002.   MEDICATIONS:  She is on Centrum multivitamin daily.   ALLERGIES:  None known.   OBSTETRICAL HISTORY:  G2 P2, two vaginal deliveries.  Age of daughters  are 54 and 29.   HISTORY:  Menarche at age 25.  Cycles are monthly medium flow.  She has  a history of abnormal Pap in 1982.  She underwent cryotherapy.  Her last  mammogram was in December of this year and was normal.   FAMILY HISTORY:  Hypertension.   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  She does not  use tobacco, alcohol or drug use.  She has a significant other and is  currently still sexually active.   A 14 review of system reviewed is negative for headache, chest pain,  shortness of breath, nausea, vomiting, diarrhea, constipation, bloody  stools, dysuria, vaginal discharge.   PHYSICAL EXAMINATION:  VITAL SIGNS:  Are as noted in the chart.  Blood  pressure is 140/94, weight is 172, pulse 66.  She is a well-nourished, well-developed female in no acute distress.  NECK:  Supple.  Normal thyroid.  LUNGS:  Clear bilaterally.  CARDIOVASCULAR:  Regular rate and rhythm without rubs, gallops or  murmurs.  ABDOMEN:  Soft, nontender, nondistended.  EXTREMITIES:  Has no cyanosis, clubbing or edema.  BREASTS:  Are symmetric with everted nipples.  No masses.  No  supraclavicular or axillary adenopathy.  GENITOURINARY:  Normal external female genitalia.  BUS is normal.  Vagina is pink and rugated.  Cervix is parous without lesion.  Uterus is  enlarged approximately 8-9 weeks size and very firm.  Adnexa without  mass or tenderness.   IMPRESSION:  1. Enlarged uterus.  2. Yearly exam.  3. Desire for change in intrauterine device.   PLAN:  1. Pap smear today.  2. Check GC and chlamydia.  3. Pelvic sonogram to rule out fibroid uterus and then revisit IUD      problems.  Since she does have fibroids, would probably elect to      keep her Mirena in place if possible.  Did discuss  oncoming      menopause.           ______________________________  Tinnie Gens, MD     TP/MEDQ  D:  10/25/2007  T:  10/25/2007  Job:  [id number]

## 2011-02-23 NOTE — Assessment & Plan Note (Signed)
NAMEJOHNNISHA, FORTON                ACCOUNT NO.:  [id number]   MEDICAL RECORD NO.:  [id number]          PATIENT TYPE:  POB   LOCATION:  [facility]         [facility]ACILITY:  [facility]   PHYSICIAN:  Johnella Moloney, MD        DATE OF BIRTH:  1960-11-29   DATE OF SERVICE:  06/24/2008                                  CLINIC NOTE   The patient is a 50 year old gravida 2, para 2, who was seen on May 22, 2008, for complaint of irregular menstrual bleeding.  At that visit,  the patient underwent evaluation for this bleeding in the form of labs  and ultrasound.  She declined endometrial biopsy.  At this point,  pending ultrasound results.  She does report that she had a normal  menstrual period on May 24, 2008.  She denies any other gynecologic  complaints.   LABORATORY FINDINGS:  A normal complete blood count with a hemoglobin of  13.5 and estradiol level of 101.3, FSH 14.1, LH 13.7, prolactin 4.4, hCG  less than 2, and TSH 2.285.  A pelvic ultrasound that was done on May 24, 2008, showed a uterus measuring 10 cm x 6.4 cm x 6.4 cm and multiple  focal fibroids with the largest measuring about 3.3 x 3.4 x 2.8 cm and 2  smaller ones that were also noted in the fundal regions.  These were all  mural in location and smaller fibroids were also noted to be distributed  diffusely throughout the myometrium.  The endometrial thickness was  noted to be 4 mm with no focal thickening or heterogeneity noted;  homogeneous entirely.  Patient had normal adnexa.   IMPRESSION:  The patient has a history of 1 irregular menstrual cycles  so far based on her history, and all evaluations for this irregular  cycles has come back negative.  The patient was told to continue to  monitor her menstrual cycles.  If she continues to have more bleeding,  then an endometrial biopsy we will be strongly warranted.  However given  that her endometrial stripe was 4 mm, there is a minimal risk of  hyperplasia or neoplasia  and also given that she does not have any other  risk factors for endometrial hyperplasia or neoplasia.  The patient  verbalized understanding of plan and we will call or come to the  emergency room if she has any urgent gynecologic issues.  Her last Pap  smear was in January 2009 and so she is due for an annual exam in  January 2010.  The patient will make an appointment for this at the end  of this visit.  Her last mammogram was in 2008.  The patient will follow  up with her PCP to schedule another ultrasound this year.           ______________________________  Johnella Moloney, MD     UD/MEDQ  D:  06/24/2008  T:  06/25/2008  Job:  [id number]

## 2011-02-23 NOTE — Assessment & Plan Note (Signed)
NAMEBRAYLEE, [name]                ACCOUNT NO.:  [id number]   MEDICAL RECORD NO.:  [id number]          PATIENT TYPE:  POB   LOCATION:  [facility]         [facility]ACILITY:  [facility]   PHYSICIAN:  Johnella Moloney, MD        DATE OF BIRTH:  [birth year]   DATE OF SERVICE:  12/23/2008                                  CLINIC NOTE   The patient is a 50 year old gravida 2, para 2 with last menstrual  period of December 04, 2008 who is here for her annual examination.  The  patient was last seen on September 19, 2008 during which visit she had a  ParaGard IUD insertion for contraception.  The patient reports that this  has been without any complications or side effects.  She is having  regular menstrual periods and has no concerns about the ParaGard.  However, she has noted within the last week an increase in discharge and  this discharge is noted to have a foul odor, it does not cause any  pruritus or any other symptoms.   PAST OBSTETRIC/GYNECOLOGICAL HISTORY:  The patient has had 2 vaginal  deliveries.  She has normal menstrual cycles.  She had ParaGard IUD that  was placed recently.  She had a mammogram in December 2009 that was  normal.  She has a history of cervical dysplasia, status post  cryotherapy and her last Pap smear in January 2009 was normal.   PAST MEDICAL HISTORY:  Possible hypertension, the patient is noted on  multiple visits to have elevated blood pressures in 140s-150s/80s-90s.  She is followed by her primary care physician about this and reports  that at home her blood pressures are in 120s/70s-80s.  Her primary care  physician did bring up the possibility of white coat hypertension.   PAST SURGICAL HISTORY:  Knee surgery in 2002.   MEDICATIONS:  Centrum, multivitamin p.o. daily, and ParaGard IUD.   ALLERGIES:  No known drug allergies.   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  She denies any  tobacco, alcohol, or illicit drug use.  She also denies any  physical or  sexual abuse.  The patient exercises regularly about 5 times a week.   FAMILY HISTORY:  Remarkable for hypertension.  No gynecologic colon or  breast cancers.   REVIEW OF SYSTEMS:  A 14-point review of systems is reviewed and is  negative apart from what was mentioned in the history of present  illness.   PHYSICAL EXAMINATION:  VITAL SIGNS:  Blood pressure 146/98, pulse 73,  weight 172 pounds, height 5 feet 6 inches.  GENERAL:  No apparent distress.  HEENT:  Normocephalic, atraumatic.  NECK:  Supple.  Normal thyroid.  No abnormal masses.  BREASTS:  Symmetric in size.  No abnormal masses, drainage, skin  changes, lymphadenopathy, or tenderness noted.  LUNGS:  Clear to auscultation bilaterally.  HEART:  Regular rate and rhythm.  ABDOMEN:  Soft, nontender, nondistended.  EXTREMITIES:  No cyanosis, clubbing, or edema.  PELVIC:  Normal external female genitalia.  Pink, well rugated vagina,  thin, grayish discharge seen in the posterior vault.  A sample was taken  for  wet prep.  Multiparous cervix noted.   ParaGard strings were noted to be about 3 cm in length from the external  os.  Pap smear was done and a reflex gonorrhea and chlamydia were also  done as for the patient's request.  On bimanual exam, the patient had a  normal size, mobile uterus.  No abnormal masses palpated.  The patient  also had normal adnexa bilaterally.   ASSESSMENT/PLAN:  The patient is a 50 year old gravida 2, para 2 here  for her annual examination.  The patient had a normal breast examination  and her next mammogram will be due in December 2010.  We will follow up  her Pap smear and also follow up results of her gonorrhea, Chlamydia  probe, and of her wet prep in order to work of her abnormal vaginal  discharge.  When the results are obtained, she will be treated  accordingly.  The patient has no other gynecologic concerns.  She was  told to follow up with the [facility] for any gynecologic  concerns or in  the year for her annual examination.  Of note, when the patient did  agree for the gonorrhea, Chlamydia testing, she declined other STD  screening.           ______________________________  Johnella Moloney, MD     UD/MEDQ  D:  12/23/2008  T:  12/24/2008  Job:  [id number]

## 2011-09-07 ENCOUNTER — Other Ambulatory Visit: Payer: Self-pay | Admitting: Family Medicine

## 2011-09-07 DIAGNOSIS — Z1231 Encounter for screening mammogram for malignant neoplasm of breast: Secondary | ICD-10-CM

## 2011-10-06 ENCOUNTER — Ambulatory Visit
Admission: RE | Admit: 2011-10-06 | Discharge: 2011-10-06 | Disposition: A | Discharge status: Home / Self Care | Payer: Federal, State, Local not specified - PPO | Source: Ambulatory Visit | Attending: Family Medicine | Admitting: Family Medicine

## 2011-10-06 DIAGNOSIS — Z1231 Encounter for screening mammogram for malignant neoplasm of breast: Secondary | ICD-10-CM

## 2011-10-13 ENCOUNTER — Encounter: Payer: Self-pay | Admitting: *Deleted

## 2012-01-03 ENCOUNTER — Other Ambulatory Visit: Payer: Self-pay | Admitting: Family Medicine

## 2012-05-01 ENCOUNTER — Other Ambulatory Visit: Payer: Self-pay | Admitting: Family Medicine

## 2012-05-01 NOTE — Telephone Encounter (Signed)
Please sched f/u Will refill electronically for a month

## 2012-05-01 NOTE — Telephone Encounter (Signed)
Request for Hydrochlorothiazide 12.5 mg. Patient hasn't been seen in a year, ok to refill?

## 2012-05-02 NOTE — Telephone Encounter (Signed)
Left message on patient vm to call office and schedule a F/U appointment.

## 2012-05-26 ENCOUNTER — Encounter: Payer: Self-pay | Admitting: Obstetrics and Gynecology

## 2012-05-26 ENCOUNTER — Ambulatory Visit (INDEPENDENT_AMBULATORY_CARE_PROVIDER_SITE_OTHER): Payer: Federal, State, Local not specified - PPO | Admitting: Obstetrics and Gynecology

## 2012-05-26 VITALS — BP 144/83 | HR 64 | Ht 66.0 in | Wt 171.0 lb

## 2012-05-26 DIAGNOSIS — Z124 Encounter for screening for malignant neoplasm of cervix: Secondary | ICD-10-CM

## 2012-05-26 DIAGNOSIS — Z01419 Encounter for gynecological examination (general) (routine) without abnormal findings: Secondary | ICD-10-CM

## 2012-05-26 MED ORDER — SERTRALINE HCL 50 MG PO TABS: 50.0000 mg | ORAL_TABLET | Freq: Every day | ORAL | Status: DC

## 2012-05-26 NOTE — Progress Notes (Signed)
  Subjective:     [name] is a 51 y.o. female G2P2 with LMP 01/2012 and BMI 27 presenting today for a comprehensive physical exam. The patient reports no problems. She experiences occasionally vasomotor symptoms which are better controlled with zoloft. Patient is otherwise doing well and denies any urinary incontinence. She is sexually active without issues.  History   Social History  . Marital Status: Single    Spouse Name: N/A    Number of Children: 2  . Years of Education: N/A   Occupational History  . [employer]    Social History Main Topics  . Smoking status: Never Smoker   . Smokeless tobacco: Never Used  . Alcohol Use: No  . Drug Use: No  . Sexually Active: Yes -- Female partner(s)    Birth Control/ Protection: IUD   Other Topics Concern  . Not on file   Social History Narrative   Exercises 4 x a week, cardio and strength training   Health Maintenance  Topic Date Due  . Tetanus/tdap  10/12/2011  . Influenza Vaccine  07/11/2012  . Mammogram  10/05/2013  . Pap Smear  12/28/2013  . Colonoscopy  03/18/2019       Review of Systems A comprehensive review of systems was negative.   Objective:     GENERAL: Well-developed, well-nourished female in no acute distress.  HEENT: Normocephalic, atraumatic. Sclerae anicteric.  NECK: Supple. Normal thyroid.  LUNGS: Clear to auscultation bilaterally.  HEART: Regular rate and rhythm. BREASTS: Symmetric in size. No palpable masses or lymphadenopathy, skin changes, or nipple drainage. ABDOMEN: Soft, nontender, nondistended. No organomegaly. PELVIC: Normal external female genitalia. Vagina is pink and rugated.  Normal discharge. Normal appearing cervix. IUD strings visualized. Uterus is normal in size. No adnexal mass or tenderness. EXTREMITIES: No cyanosis, clubbing, or edema, 2+ distal pulses.    Assessment:    Healthy female exam.     Plan:    pap smear performed Patient encouraged to continue self breat and vulva  exam Patient encouraged to continue current exercise regimen and taking multivitamins Patient due for screening mammogram in December Rx refill on zoloft provided See After Visit Summary for Counseling Recommendations

## 2012-05-26 NOTE — Patient Instructions (Signed)
Preventive Care for Adults, Female A healthy lifestyle and preventive care can promote health and wellness. Preventive health guidelines for women include the following key practices.  A routine yearly physical is a good way to check with your caregiver about your health and preventive screening. It is a chance to share any concerns and updates on your health, and to receive a thorough exam.   Visit your dentist for a routine exam and preventive care every 6 months. Brush your teeth twice a day and floss once a day. Good oral hygiene prevents tooth decay and gum disease.   The frequency of eye exams is based on your age, health, family medical history, use of contact lenses, and other factors. Follow your caregiver's recommendations for frequency of eye exams.   Eat a healthy diet. Foods like vegetables, fruits, whole grains, low-fat dairy products, and lean protein foods contain the nutrients you need without too many calories. Decrease your intake of foods high in solid fats, added sugars, and salt. Eat the right amount of calories for you.Get information about a proper diet from your caregiver, if necessary.   Regular physical exercise is one of the most important things you can do for your health. Most adults should get at least 150 minutes of moderate-intensity exercise (any activity that increases your heart rate and causes you to sweat) each week. In addition, most adults need muscle-strengthening exercises on 2 or more days a week.   Maintain a healthy weight. The body mass index (BMI) is a screening tool to identify possible weight problems. It provides an estimate of body fat based on height and weight. Your caregiver can help determine your BMI, and can help you achieve or maintain a healthy weight.For adults 20 years and older:   A BMI below 18.5 is considered underweight.   A BMI of 18.5 to 24.9 is normal.   A BMI of 25 to 29.9 is considered overweight.   A BMI of 30 and above  is considered obese.   Maintain normal blood lipids and cholesterol levels by exercising and minimizing your intake of saturated fat. Eat a balanced diet with plenty of fruit and vegetables. Blood tests for lipids and cholesterol should begin at age 20 and be repeated every 5 years. If your lipid or cholesterol levels are high, you are over 50, or you are at high risk for heart disease, you may need your cholesterol levels checked more frequently.Ongoing high lipid and cholesterol levels should be treated with medicines if diet and exercise are not effective.   If you smoke, find out from your caregiver how to quit. If you do not use tobacco, do not start.   If you are pregnant, do not drink alcohol. If you are breastfeeding, be very cautious about drinking alcohol. If you are not pregnant and choose to drink alcohol, do not exceed 1 drink per day. One drink is considered to be 12 ounces (355 mL) of beer, 5 ounces (148 mL) of wine, or 1.5 ounces (44 mL) of liquor.   Avoid use of street drugs. Do not share needles with anyone. Ask for help if you need support or instructions about stopping the use of drugs.   High blood pressure causes heart disease and increases the risk of stroke. Your blood pressure should be checked at least every 1 to 2 years. Ongoing high blood pressure should be treated with medicines if weight loss and exercise are not effective.   If you are 55 to 53   years old, ask your caregiver if you should take aspirin to prevent strokes.   Diabetes screening involves taking a blood sample to check your fasting blood sugar level. This should be done once every 3 years, after age 45, if you are within normal weight and without risk factors for diabetes. Testing should be considered at a younger age or be carried out more frequently if you are overweight and have at least 1 risk factor for diabetes.   Breast cancer screening is essential preventive care for women. You should practice  "breast self-awareness." This means understanding the normal appearance and feel of your breasts and may include breast self-examination. Any changes detected, no matter how small, should be reported to a caregiver. Women in their 20s and 30s should have a clinical breast exam (CBE) by a caregiver as part of a regular health exam every 1 to 3 years. After age 40, women should have a CBE every year. Starting at age 40, women should consider having a mammography (breast X-ray test) every year. Women who have a family history of breast cancer should talk to their caregiver about genetic screening. Women at a high risk of breast cancer should talk to their caregivers about having magnetic resonance imaging (MRI) and a mammography every year.   The Pap test is a screening test for cervical cancer. A Pap test can show cell changes on the cervix that might become cervical cancer if left untreated. A Pap test is a procedure in which cells are obtained and examined from the lower end of the uterus (cervix).   Women should have a Pap test starting at age 21.   Between ages 21 and 29, Pap tests should be repeated every 2 years.   Beginning at age 30, you should have a Pap test every 3 years as long as the past 3 Pap tests have been normal.   Some women have medical problems that increase the chance of getting cervical cancer. Talk to your caregiver about these problems. It is especially important to talk to your caregiver if a new problem develops soon after your last Pap test. In these cases, your caregiver may recommend more frequent screening and Pap tests.   The above recommendations are the same for women who have or have not gotten the vaccine for human papillomavirus (HPV).   If you had a hysterectomy for a problem that was not cancer or a condition that could lead to cancer, then you no longer need Pap tests. Even if you no longer need a Pap test, a regular exam is a good idea to make sure no other  problems are starting.   If you are between ages 65 and 70, and you have had normal Pap tests going back 10 years, you no longer need Pap tests. Even if you no longer need a Pap test, a regular exam is a good idea to make sure no other problems are starting.   If you have had past treatment for cervical cancer or a condition that could lead to cancer, you need Pap tests and screening for cancer for at least 20 years after your treatment.   If Pap tests have been discontinued, risk factors (such as a new sexual partner) need to be reassessed to determine if screening should be resumed.   The HPV test is an additional test that may be used for cervical cancer screening. The HPV test looks for the virus that can cause the cell changes on the cervix.   The cells collected during the Pap test can be tested for HPV. The HPV test could be used to screen women aged 30 years and older, and should be used in women of any age who have unclear Pap test results. After the age of 30, women should have HPV testing at the same frequency as a Pap test.   Colorectal cancer can be detected and often prevented. Most routine colorectal cancer screening begins at the age of 50 and continues through age 75. However, your caregiver may recommend screening at an earlier age if you have risk factors for colon cancer. On a yearly basis, your caregiver may provide home test kits to check for hidden blood in the stool. Use of a small camera at the end of a tube, to directly examine the colon (sigmoidoscopy or colonoscopy), can detect the earliest forms of colorectal cancer. Talk to your caregiver about this at age 50, when routine screening begins. Direct examination of the colon should be repeated every 5 to 10 years through age 75, unless early forms of pre-cancerous polyps or small growths are found.   Hepatitis C blood testing is recommended for all people born from 1945 through 1965 and any individual with known risks for  hepatitis C.   Practice safe sex. Use condoms and avoid high-risk sexual practices to reduce the spread of sexually transmitted infections (STIs). STIs include gonorrhea, chlamydia, syphilis, trichomonas, herpes, HPV, and human immunodeficiency virus (HIV). Herpes, HIV, and HPV are viral illnesses that have no cure. They can result in disability, cancer, and death. Sexually active women aged 25 and younger should be checked for chlamydia. Older women with new or multiple partners should also be tested for chlamydia. Testing for other STIs is recommended if you are sexually active and at increased risk.   Osteoporosis is a disease in which the bones lose minerals and strength with aging. This can result in serious bone fractures. The risk of osteoporosis can be identified using a bone density scan. Women ages 65 and over and women at risk for fractures or osteoporosis should discuss screening with their caregivers. Ask your caregiver whether you should take a calcium supplement or vitamin D to reduce the rate of osteoporosis.   Menopause can be associated with physical symptoms and risks. Hormone replacement therapy is available to decrease symptoms and risks. You should talk to your caregiver about whether hormone replacement therapy is right for you.   Use sunscreen with sun protection factor (SPF) of 30 or more. Apply sunscreen liberally and repeatedly throughout the day. You should seek shade when your shadow is shorter than you. Protect yourself by wearing long sleeves, pants, a wide-brimmed hat, and sunglasses year round, whenever you are outdoors.   Once a month, do a whole body skin exam, using a mirror to look at the skin on your back. Notify your caregiver of new moles, moles that have irregular borders, moles that are larger than a pencil eraser, or moles that have changed in shape or color.   Stay current with required immunizations.   Influenza. You need a dose every fall (or winter). The  composition of the flu vaccine changes each year, so being vaccinated once is not enough.   Pneumococcal polysaccharide. You need 1 to 2 doses if you smoke cigarettes or if you have certain chronic medical conditions. You need 1 dose at age 65 (or older) if you have never been vaccinated.   Tetanus, diphtheria, pertussis (Tdap, Td). Get 1 dose of   Tdap vaccine if you are younger than age 65, are over 65 and have contact with an infant, are a healthcare worker, are pregnant, or simply want to be protected from whooping cough. After that, you need a Td booster dose every 10 years. Consult your caregiver if you have not had at least 3 tetanus and diphtheria-containing shots sometime in your life or have a deep or dirty wound.   HPV. You need this vaccine if you are a woman age 26 or younger. The vaccine is given in 3 doses over 6 months.   Measles, mumps, rubella (MMR). You need at least 1 dose of MMR if you were born in 1957 or later. You may also need a second dose.   Meningococcal. If you are age 19 to 21 and a first-year college student living in a residence hall, or have one of several medical conditions, you need to get vaccinated against meningococcal disease. You may also need additional booster doses.   Zoster (shingles). If you are age 60 or older, you should get this vaccine.   Varicella (chickenpox). If you have never had chickenpox or you were vaccinated but received only 1 dose, talk to your caregiver to find out if you need this vaccine.   Hepatitis A. You need this vaccine if you have a specific risk factor for hepatitis A virus infection or you simply wish to be protected from this disease. The vaccine is usually given as 2 doses, 6 to 18 months apart.   Hepatitis B. You need this vaccine if you have a specific risk factor for hepatitis B virus infection or you simply wish to be protected from this disease. The vaccine is given in 3 doses, usually over 6 months.  Preventive Services /  Frequency Ages 40 to 64  Blood pressure check.** / Every 1 to 2 years.   Lipid and cholesterol check.** / Every 5 years beginning at age 20.   Clinical breast exam.** / Every year after age 40.   Mammogram.** / Every year beginning at age 40 and continuing for as long as you are in good health. Consult with your caregiver.   Pap test.** / Every 3 years starting at age 30 through age 65 or 70 with a history of 3 consecutive normal Pap tests.   HPV screening.** / Every 3 years from ages 30 through ages 65 to 70 with a history of 3 consecutive normal Pap tests.   Fecal occult blood test (FOBT) of stool. / Every year beginning at age 50 and continuing until age 75. You may not need to do this test if you get a colonoscopy every 10 years.   Flexible sigmoidoscopy or colonoscopy.** / Every 5 years for a flexible sigmoidoscopy or every 10 years for a colonoscopy beginning at age 50 and continuing until age 75.   Hepatitis C blood test.** / For all people born from 1945 through 1965 and any individual with known risks for hepatitis C.   Skin self-exam. / Monthly.   Influenza immunization.** / Every year.   Pneumococcal polysaccharide immunization.** / 1 to 2 doses if you smoke cigarettes or if you have certain chronic medical conditions.   Tetanus, diphtheria, pertussis (Tdap, Td) immunization.** / A one-time dose of Tdap vaccine. After that, you need a Td booster dose every 10 years.   Measles, mumps, rubella (MMR) immunization. / You need at least 1 dose of MMR if you were born in 1957 or later. You may also need a   second dose.   Varicella immunization.** / Consult your caregiver.   Meningococcal immunization.** / Consult your caregiver.   Hepatitis A immunization.** / Consult your caregiver. 2 doses, 6 to 18 months apart.   Hepatitis B immunization.** / Consult your caregiver. 3 doses, usually over 6 months.  ** Family history and personal history of risk and conditions may change  your caregiver's recommendations. Document Released: 11/23/2001 Document Revised: 09/16/2011 Document Reviewed: 02/22/2011 [employer] Patient Information 2012 [employer]. 

## 2012-08-28 ENCOUNTER — Other Ambulatory Visit: Payer: Self-pay | Admitting: Family Medicine

## 2012-08-31 ENCOUNTER — Other Ambulatory Visit: Payer: Self-pay | Admitting: Family Medicine

## 2012-08-31 DIAGNOSIS — Z1231 Encounter for screening mammogram for malignant neoplasm of breast: Secondary | ICD-10-CM

## 2012-10-01 ENCOUNTER — Telehealth: Payer: Self-pay | Admitting: Family Medicine

## 2012-10-01 DIAGNOSIS — E785 Hyperlipidemia, unspecified: Secondary | ICD-10-CM

## 2012-10-01 DIAGNOSIS — Z0000 Encounter for general adult medical examination without abnormal findings: Secondary | ICD-10-CM | POA: Insufficient documentation

## 2012-10-01 NOTE — Telephone Encounter (Signed)
Message copied by Judy Pimple on Sun Oct 01, 2012  3:20 PM ------      Message from: Baldomero Lamy      Created: Tue Sep 26, 2012 12:42 PM      Regarding: Cpx labs Mon 12/23       Please order  future cpx labs for pt's upcoming lab appt.      Thanks      Rodney Booze

## 2012-10-02 ENCOUNTER — Other Ambulatory Visit (INDEPENDENT_AMBULATORY_CARE_PROVIDER_SITE_OTHER): Payer: Federal, State, Local not specified - PPO

## 2012-10-02 DIAGNOSIS — Z0000 Encounter for general adult medical examination without abnormal findings: Secondary | ICD-10-CM

## 2012-10-02 DIAGNOSIS — E785 Hyperlipidemia, unspecified: Secondary | ICD-10-CM

## 2012-10-02 LAB — CBC WITH DIFFERENTIAL/PLATELET
Basophils Absolute: 0 10*3/uL (ref 0.0–0.1)
Basophils Relative: 0.5 % (ref 0.0–3.0)
Eosinophils Absolute: 0.2 10*3/uL (ref 0.0–0.7)
Eosinophils Relative: 2.6 % (ref 0.0–5.0)
HCT: 38.2 % (ref 36.0–46.0)
Hemoglobin: 13.2 g/dL (ref 12.0–15.0)
Lymphocytes Relative: 30.9 % (ref 12.0–46.0)
Lymphs Abs: 1.9 10*3/uL (ref 0.7–4.0)
MCHC: 34.5 g/dL (ref 30.0–36.0)
MCV: 97.2 fl (ref 78.0–100.0)
Monocytes Absolute: 0.5 10*3/uL (ref 0.1–1.0)
Monocytes Relative: 8.3 % (ref 3.0–12.0)
Neutro Abs: 3.5 10*3/uL (ref 1.4–7.7)
Neutrophils Relative %: 57.7 % (ref 43.0–77.0)
Platelets: 215 10*3/uL (ref 150.0–400.0)
RBC: 3.93 Mil/uL (ref 3.87–5.11)
RDW: 12.7 % (ref 11.5–14.6)
WBC: 6.1 10*3/uL (ref 4.5–10.5)

## 2012-10-02 LAB — LIPID PANEL
Cholesterol: 204 mg/dL — ABNORMAL HIGH (ref 0–200)
HDL: 56.3 mg/dL (ref >39.00)
Total CHOL/HDL Ratio: 4
Triglycerides: 122 mg/dL (ref 0.0–149.0)
VLDL: 24.4 mg/dL (ref 0.0–40.0)

## 2012-10-02 LAB — COMPREHENSIVE METABOLIC PANEL
ALT: 11 U/L (ref 0–35)
AST: 23 U/L (ref 0–37)
Albumin: 3.5 g/dL (ref 3.5–5.2)
Alkaline Phosphatase: 66 U/L (ref 39–117)
BUN: 11 mg/dL (ref 6–23)
CO2: 27 mEq/L (ref 19–32)
Calcium: 9.5 mg/dL (ref 8.4–10.5)
Chloride: 103 mEq/L (ref 96–112)
Creatinine, Ser: 1.1 mg/dL (ref 0.4–1.2)
GFR: 69.48 mL/min (ref >60.00)
Glucose, Bld: 100 mg/dL — ABNORMAL HIGH (ref 70–99)
Potassium: 3.7 mEq/L (ref 3.5–5.1)
Sodium: 136 mEq/L (ref 135–145)
Total Bilirubin: 1 mg/dL (ref 0.3–1.2)
Total Protein: 7 g/dL (ref 6.0–8.3)

## 2012-10-02 LAB — TSH: TSH: 1.66 u[IU]/mL (ref 0.35–5.50)

## 2012-10-02 LAB — LDL CHOLESTEROL, DIRECT: Direct LDL: 128.8 mg/dL

## 2012-10-06 ENCOUNTER — Ambulatory Visit
Admission: RE | Admit: 2012-10-06 | Discharge: 2012-10-06 | Disposition: A | Discharge status: Home / Self Care | Payer: Federal, State, Local not specified - PPO | Source: Ambulatory Visit | Attending: Family Medicine | Admitting: Family Medicine

## 2012-10-06 DIAGNOSIS — Z1231 Encounter for screening mammogram for malignant neoplasm of breast: Secondary | ICD-10-CM

## 2012-10-09 ENCOUNTER — Encounter: Payer: Self-pay | Admitting: *Deleted

## 2012-10-10 ENCOUNTER — Ambulatory Visit (INDEPENDENT_AMBULATORY_CARE_PROVIDER_SITE_OTHER): Payer: Federal, State, Local not specified - PPO | Admitting: Family Medicine

## 2012-10-10 ENCOUNTER — Encounter: Payer: Self-pay | Admitting: Family Medicine

## 2012-10-10 VITALS — BP 132/82 | HR 73 | Temp 97.8°F | Ht 65.25 in | Wt 177.2 lb

## 2012-10-10 DIAGNOSIS — E785 Hyperlipidemia, unspecified: Secondary | ICD-10-CM

## 2012-10-10 DIAGNOSIS — Z23 Encounter for immunization: Secondary | ICD-10-CM

## 2012-10-10 DIAGNOSIS — Z0000 Encounter for general adult medical examination without abnormal findings: Secondary | ICD-10-CM

## 2012-10-10 DIAGNOSIS — I10 Essential (primary) hypertension: Secondary | ICD-10-CM

## 2012-10-10 MED ORDER — HYDROCHLOROTHIAZIDE 12.5 MG PO CAPS: 12.5000 mg | ORAL_CAPSULE | Freq: Every day | ORAL | Status: DC

## 2012-10-10 NOTE — Assessment & Plan Note (Signed)
bp in fair control at this time  No changes needed  Disc lifstyle change with low sodium diet and exercise  Labs reviewed  hctz refilled

## 2012-10-10 NOTE — Assessment & Plan Note (Signed)
Disc goals for lipids and reasons to control them Rev labs with pt Rev low sat fat diet in detail   

## 2012-10-10 NOTE — Progress Notes (Signed)
Subjective:    Patient ID: [name], female    DOB: September 10, 1961, 51 y.o.   MRN: [id number]  HPI Here for health maintenance exam and to review chronic medical problems    Is doing well overall   Wt is up 6 lb with bmi of 29 She is  Disappointed with that  Is in the gym 5 days per week  Eating is not optimal  In the past tried my fitness pal  Needs to cut portions and also make smarter choices   Td 03-needs that today  Flu vaccine -thinking about   mammo 12/13 Self exam-no lumps or changes   Pap 8/13-- was ok with gyn  Has IUD -- last period was April or may   colonosc 6/10 Mother had colon cancer  No bowel changes   Lab Results  Component Value Date   CHOL 204* 10/02/2012   CHOL 215* 02/11/2011   CHOL 191 02/11/2010   Lab Results  Component Value Date   HDL 56.30 10/02/2012   HDL 65.30 02/11/2011   HDL 60.50 02/11/2010   Lab Results  Component Value Date   LDLCALC 112* 02/11/2010   LDLCALC 112* 08/20/2008   LDLCALC 111* 11/09/2007   Lab Results  Component Value Date   TRIG 122.0 10/02/2012   TRIG 118.0 02/11/2011   TRIG 95.0 02/11/2010   Lab Results  Component Value Date   CHOLHDL 4 10/02/2012   CHOLHDL 3 02/11/2011   CHOLHDL 3 02/11/2010   Lab Results  Component Value Date   LDLDIRECT 128.8 10/02/2012   LDLDIRECT 129.8 02/11/2011    No big changes  Is cutting back on sausage and bacon and some fatty foods   Mood- is overall good   Patient Active Problem List  Diagnosis  . HYPERLIPIDEMIA  . ESSENTIAL HYPERTENSION, BENIGN  . OSTEOARTHRITIS, KNEE  . BACK PAIN  . Routine general medical examination at a health care facility   Past Medical History  Diagnosis Date  . Hyperlipidemia   . Hypertension     with some white coat component  . Fibroids     uterine  . Abnormal finding on Pap smear 2006    endometrial cells, biopsy ok  . History of pelvic ultrasound 10/2007    uterine fibroids  . Herpes zoster     mild case  . Anxiety    Past Surgical History   Procedure Date  . Knee surgery 2002  . Iud removal 2006  . Wisdom tooth extraction   . Cryoablation 1982    abnormal pap   History  Substance Use Topics  . Smoking status: Never Smoker   . Smokeless tobacco: Never Used  . Alcohol Use: No   Family History  Problem Relation Age of Onset  . Glaucoma Father   . Heart disease Mother   . Heart attack Mother     stent  . Hyperlipidemia Mother   . Colon cancer Mother   . Hypertension Mother   . Cancer Mother     colon cancer  . Goiter Daughter    No Known Allergies Current Outpatient Prescriptions on File Prior to Visit  Medication Sig Dispense Refill  . hydrochlorothiazide (MICROZIDE) 12.5 MG capsule TAKE 1 CAPSULE BY MOUTH EVERY MORNING  90 capsule  0  . IUD's (PARAGARD INTRAUTERINE COPPER) IUD IUD 1 each by Intrauterine route once.      . Multiple Vitamin (MULTIVITAMIN) capsule Take 1 capsule by mouth daily.        Marland Kitchen  sertraline (ZOLOFT) 50 MG tablet Take 50 mg by mouth as needed.          Review of Systems Review of Systems  Constitutional: Negative for fever, appetite change, fatigue and unexpected weight change.  Eyes: Negative for pain and visual disturbance.  Respiratory: Negative for cough and shortness of breath.   Cardiovascular: Negative for cp or palpitations    Gastrointestinal: Negative for nausea, diarrhea and constipation.  Genitourinary: Negative for urgency and frequency.  Skin: Negative for pallor or rash   Neurological: Negative for weakness, light-headedness, numbness and headaches.  Hematological: Negative for adenopathy. Does not bruise/bleed easily.  Psychiatric/Behavioral: Negative for dysphoric mood. The patient is not nervous/anxious.         Objective:   Physical Exam  Constitutional: She appears well-developed and well-nourished. No distress.       overwt and well appearing   HENT:  Head: Normocephalic and atraumatic.  Right Ear: External ear normal.  Left Ear: External ear normal.    Nose: Nose normal.  Mouth/Throat: Oropharynx is clear and moist. No oropharyngeal exudate.  Eyes: Conjunctivae normal and EOM are normal. Pupils are equal, round, and reactive to light. Right eye exhibits no discharge. Left eye exhibits no discharge. No scleral icterus.  Neck: Normal range of motion. Neck supple. No JVD present. Carotid bruit is not present. No thyromegaly present.  Cardiovascular: Normal rate, regular rhythm, normal heart sounds and intact distal pulses.  Exam reveals no gallop.   Pulmonary/Chest: Effort normal and breath sounds normal. No respiratory distress. She has no wheezes.  Abdominal: Soft. Bowel sounds are normal. She exhibits no distension, no abdominal bruit and no mass. There is no tenderness.  Musculoskeletal: Normal range of motion. She exhibits no edema and no tenderness.  Lymphadenopathy:    She has no cervical adenopathy.  Neurological: She is alert. She has normal reflexes. No cranial nerve deficit. She exhibits normal muscle tone. Coordination normal.  Skin: Skin is warm and dry. No rash noted. No erythema. No pallor.  Psychiatric: She has a normal mood and affect.          Assessment & Plan:

## 2012-10-10 NOTE — Assessment & Plan Note (Signed)
Reviewed health habits including diet and exercise and skin cancer prevention Also reviewed health mt list, fam hx and immunizations  Disc plan for wt loss  Tdap and flu vaccines today

## 2012-10-10 NOTE — Patient Instructions (Addendum)
Tdap vaccine today (tetanus)  Flu vaccine today  Try to get 1200-1500 mg of calcium per day with at least 1000 iu of vitamin D - for bone health  Keep exercising  Work on healthy diet and smaller portions

## 2012-12-24 ENCOUNTER — Other Ambulatory Visit: Payer: Self-pay | Admitting: Family Medicine

## 2013-04-27 ENCOUNTER — Other Ambulatory Visit: Payer: Self-pay | Admitting: Family Medicine

## 2013-04-27 NOTE — Telephone Encounter (Signed)
Left voicemail requesting pt to call office 

## 2013-04-27 NOTE — Telephone Encounter (Signed)
Electronic refill request, no recent/future appts., please advise  

## 2013-04-27 NOTE — Telephone Encounter (Signed)
Please make f/u for 6 mo and refill until then

## 2013-04-30 NOTE — Telephone Encounter (Signed)
F/u scheduled for 10/16/12 and med refilled

## 2013-09-14 ENCOUNTER — Other Ambulatory Visit: Payer: Self-pay

## 2013-09-14 DIAGNOSIS — Z1231 Encounter for screening mammogram for malignant neoplasm of breast: Secondary | ICD-10-CM

## 2013-10-16 ENCOUNTER — Ambulatory Visit: Payer: Federal, State, Local not specified - PPO | Admitting: Family Medicine

## 2013-10-17 ENCOUNTER — Encounter: Payer: Self-pay | Admitting: Family Medicine

## 2013-10-17 ENCOUNTER — Ambulatory Visit (INDEPENDENT_AMBULATORY_CARE_PROVIDER_SITE_OTHER): Payer: Federal, State, Local not specified - PPO | Admitting: Family Medicine

## 2013-10-17 ENCOUNTER — Ambulatory Visit
Admission: RE | Admit: 2013-10-17 | Discharge: 2013-10-17 | Disposition: A | Discharge status: Home / Self Care | Payer: Federal, State, Local not specified - PPO | Source: Ambulatory Visit

## 2013-10-17 VITALS — BP 130/72 | HR 79 | Temp 98.1°F | Ht 65.25 in | Wt 173.8 lb

## 2013-10-17 DIAGNOSIS — F419 Anxiety disorder, unspecified: Secondary | ICD-10-CM

## 2013-10-17 DIAGNOSIS — Z23 Encounter for immunization: Secondary | ICD-10-CM

## 2013-10-17 DIAGNOSIS — E785 Hyperlipidemia, unspecified: Secondary | ICD-10-CM

## 2013-10-17 DIAGNOSIS — I10 Essential (primary) hypertension: Secondary | ICD-10-CM

## 2013-10-17 DIAGNOSIS — Z1231 Encounter for screening mammogram for malignant neoplasm of breast: Secondary | ICD-10-CM

## 2013-10-17 DIAGNOSIS — F411 Generalized anxiety disorder: Secondary | ICD-10-CM

## 2013-10-17 DIAGNOSIS — F341 Dysthymic disorder: Secondary | ICD-10-CM | POA: Insufficient documentation

## 2013-10-17 LAB — LIPID PANEL
Cholesterol: 232 mg/dL — ABNORMAL HIGH (ref 0–200)
HDL: 66.3 mg/dL (ref >39.00)
Total CHOL/HDL Ratio: 3
Triglycerides: 122 mg/dL (ref 0.0–149.0)
VLDL: 24.4 mg/dL (ref 0.0–40.0)

## 2013-10-17 LAB — COMPREHENSIVE METABOLIC PANEL
ALT: 12 U/L (ref 0–35)
AST: 26 U/L (ref 0–37)
Albumin: 4.2 g/dL (ref 3.5–5.2)
Alkaline Phosphatase: 80 U/L (ref 39–117)
BUN: 10 mg/dL (ref 6–23)
CO2: 30 mEq/L (ref 19–32)
Calcium: 9.8 mg/dL (ref 8.4–10.5)
Chloride: 101 mEq/L (ref 96–112)
Creatinine, Ser: 1.1 mg/dL (ref 0.4–1.2)
GFR: 65.63 mL/min (ref >60.00)
Glucose, Bld: 89 mg/dL (ref 70–99)
Potassium: 3.9 mEq/L (ref 3.5–5.1)
Sodium: 138 mEq/L (ref 135–145)
Total Bilirubin: 1.1 mg/dL (ref 0.3–1.2)
Total Protein: 8 g/dL (ref 6.0–8.3)

## 2013-10-17 LAB — LDL CHOLESTEROL, DIRECT: Direct LDL: 128.6 mg/dL

## 2013-10-17 LAB — CBC WITH DIFFERENTIAL/PLATELET
Basophils Absolute: 0 10*3/uL (ref 0.0–0.1)
Basophils Relative: 0.2 % (ref 0.0–3.0)
Eosinophils Absolute: 0.2 10*3/uL (ref 0.0–0.7)
Eosinophils Relative: 3 % (ref 0.0–5.0)
HCT: 40.5 % (ref 36.0–46.0)
Hemoglobin: 13.7 g/dL (ref 12.0–15.0)
Lymphocytes Relative: 33.3 % (ref 12.0–46.0)
Lymphs Abs: 2.4 10*3/uL (ref 0.7–4.0)
MCHC: 33.8 g/dL (ref 30.0–36.0)
MCV: 96.2 fl (ref 78.0–100.0)
Monocytes Absolute: 0.7 10*3/uL (ref 0.1–1.0)
Monocytes Relative: 9.5 % (ref 3.0–12.0)
Neutro Abs: 3.9 10*3/uL (ref 1.4–7.7)
Neutrophils Relative %: 54 % (ref 43.0–77.0)
Platelets: 248 10*3/uL (ref 150.0–400.0)
RBC: 4.21 Mil/uL (ref 3.87–5.11)
RDW: 12.9 % (ref 11.5–14.6)
WBC: 7.3 10*3/uL (ref 4.5–10.5)

## 2013-10-17 LAB — TSH: TSH: 0.77 u[IU]/mL (ref 0.35–5.50)

## 2013-10-17 MED ORDER — HYDROCHLOROTHIAZIDE 12.5 MG PO CAPS: ORAL_CAPSULE | ORAL | Status: DC

## 2013-10-17 NOTE — Progress Notes (Signed)
Pre-visit discussion using our clinic review tool. No additional management support is needed unless otherwise documented below in the visit note.  

## 2013-10-17 NOTE — Progress Notes (Signed)
Subjective:    Patient ID: [name], female    DOB: [birth year], 53 y.o.   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 - after a stroke (she is not very mobile)  She does not take zoloft very much  Was worried about long term use  Feels like she may need it regularly  She has good support and vents to them often  Has relaxation techniques and deep breathing  Wrestles with guilt at times-when away from her mother    Her bp was great for a while - then it went up with stress  Up and down  She just worked out - bp is up right now - better on 2nd check  bp is stable today  No cp or palpitations or headaches or edema  No side effects to medicines  BP Readings from Last 3 Encounters:  10/17/13 142/96  10/10/12 132/82  05/26/12 144/83     Has whitecoat syndrome   Flu shot- today   Has mammogram today   She still has an iud   Patient Active Problem List   Diagnosis Date Noted  . Anxiety disorder 10/17/2013  . Routine general medical examination at a health care facility 10/01/2012  . BACK PAIN 08/18/2010  . ESSENTIAL HYPERTENSION, BENIGN 01/29/2010  . HYPERLIPIDEMIA 10/09/2007  . OSTEOARTHRITIS, KNEE 10/09/2007   Past Medical History  Diagnosis Date  . Hyperlipidemia   . Hypertension     with some white coat component  . Fibroids     uterine  . Abnormal finding on Pap smear 2006    endometrial cells, biopsy ok  . History of pelvic ultrasound 10/2007    uterine fibroids  . Herpes zoster     mild case  . Anxiety    Past Surgical History  Procedure Laterality Date  . Knee surgery  2002  . Iud removal  2006  . Wisdom tooth extraction    . Cryoablation  1982    abnormal pap   History  Substance Use Topics  . Smoking status: Never Smoker   . Smokeless tobacco: Never Used  . Alcohol Use: No   Family History  Problem Relation Age of Onset  . Glaucoma Father   . Heart disease Mother   .  Heart attack Mother     stent  . Hyperlipidemia Mother   . Colon cancer Mother   . Hypertension Mother   . Cancer Mother     colon cancer  . Goiter Daughter    No Known Allergies Current Outpatient Prescriptions on File Prior to Visit  Medication Sig Dispense Refill  . hydrochlorothiazide (MICROZIDE) 12.5 MG capsule TAKE 1 CAPSULE BY MOUTH EVERY MORNING  90 capsule  1  . IUD's (PARAGARD INTRAUTERINE COPPER) IUD IUD 1 each by Intrauterine route once.      . Multiple Vitamin (MULTIVITAMIN) capsule Take 1 capsule by mouth daily.        . sertraline (ZOLOFT) 50 MG tablet Take 50 mg by mouth as needed.       No current facility-administered medications on file prior to visit.      Review of Systems    Review of Systems  Constitutional: Negative for fever, appetite change, fatigue and unexpected weight change.  Eyes: Negative for pain and visual disturbance.  Respiratory: Negative for cough and shortness of breath.   Cardiovascular: Negative for cp or palpitations    Gastrointestinal:  Negative for nausea, diarrhea and constipation.  Genitourinary: Negative for urgency and frequency.  Skin: Negative for pallor or rash   Neurological: Negative for weakness, light-headedness, numbness and headaches.  Hematological: Negative for adenopathy. Does not bruise/bleed easily.  Psychiatric/Behavioral: Negative for dysphoric mood. The patient is  nervous/anxious.  - pos for stressors     Objective:   Physical Exam  Constitutional: She appears well-developed and well-nourished. No distress.  HENT:  Head: Normocephalic and atraumatic.  Mouth/Throat: Oropharynx is clear and moist.  Eyes: Conjunctivae and EOM are normal. Pupils are equal, round, and reactive to light. Right eye exhibits no discharge. Left eye exhibits no discharge. No scleral icterus.  Neck: Normal range of motion. Neck supple. No JVD present. No thyromegaly present.  Cardiovascular: Normal rate, regular rhythm, normal heart  sounds and intact distal pulses.  Exam reveals no gallop.   Pulmonary/Chest: Effort normal and breath sounds normal. No respiratory distress. She has no wheezes. She has no rales.  Abdominal: Soft. Bowel sounds are normal. She exhibits no distension and no mass. There is no tenderness.  Musculoskeletal: She exhibits no edema and no tenderness.  Lymphadenopathy:    She has no cervical adenopathy.  Neurological: She is alert. She has normal reflexes. No cranial nerve deficit. She exhibits normal muscle tone. Coordination normal.  Skin: Skin is warm and dry. No rash noted. No pallor.  Psychiatric: She has a normal mood and affect.          Assessment & Plan:

## 2013-10-17 NOTE — Patient Instructions (Signed)
Labs today  Flu shot today  If you want to see a counselor for stress at any point-please let me know  Follow up in about 4-5 weeks Keep up the exercise and calorie counting

## 2013-10-18 ENCOUNTER — Encounter: Payer: Self-pay | Admitting: *Deleted

## 2013-10-18 NOTE — Assessment & Plan Note (Signed)
Check lipids today Disc goals for lipids and reasons to control them Rev labs with pt from last draw Rev low sat fat diet in detail

## 2013-10-18 NOTE — Assessment & Plan Note (Signed)
Better on 2nd check (pt just exercised as well) bp in fair control at this time  No changes needed Disc lifstyle change with low sodium diet and exercise   Labs today

## 2013-10-18 NOTE — Assessment & Plan Note (Signed)
Worse with stressors/ caregiver stress Commended exercise Enc use of zoloft daily inst of prn  Offered counseling ref at any time

## 2013-11-12 ENCOUNTER — Other Ambulatory Visit: Payer: Self-pay | Admitting: Obstetrics and Gynecology

## 2013-11-23 ENCOUNTER — Ambulatory Visit (INDEPENDENT_AMBULATORY_CARE_PROVIDER_SITE_OTHER): Payer: Federal, State, Local not specified - PPO | Admitting: Family Medicine

## 2013-11-23 ENCOUNTER — Encounter: Payer: Self-pay | Admitting: Family Medicine

## 2013-11-23 VITALS — BP 138/92 | HR 71 | Temp 98.0°F | Ht 65.25 in | Wt 172.8 lb

## 2013-11-23 DIAGNOSIS — F411 Generalized anxiety disorder: Secondary | ICD-10-CM

## 2013-11-23 DIAGNOSIS — I10 Essential (primary) hypertension: Secondary | ICD-10-CM

## 2013-11-23 DIAGNOSIS — F419 Anxiety disorder, unspecified: Secondary | ICD-10-CM

## 2013-11-23 MED ORDER — SERTRALINE HCL 50 MG PO TABS: 50.0000 mg | ORAL_TABLET | Freq: Every day | ORAL | Status: DC

## 2013-11-23 NOTE — Progress Notes (Signed)
Pre-visit discussion using our clinic review tool. No additional management support is needed unless otherwise documented below in the visit note.  

## 2013-11-23 NOTE — Progress Notes (Signed)
Subjective:    Patient ID: [name], female    DOB: 1961/07/01, 53 y.o.   MRN: [id number]  HPI Here for f/u of chronic health problems   Wt is down 1 lb with bmi of 28 She is headed to [employer]   Mood Last visit enc to take zoloft every day She did this - a little more sleepy - she takes it am  Stress is higher - caring for her mother - and her health - is declining with CHF and various things /02 dependent  Home health is coming in also   bp is up a bit today-pt blames this on stress  No cp or palpitations or headaches or edema  No side effects to medicines  BP Readings from Last 3 Encounters:  11/23/13 138/92  10/17/13 130/72  10/10/12 132/82       Chemistry      Component Value Date/Time   NA 138 10/17/2013 1241   K 3.9 10/17/2013 1241   CL 101 10/17/2013 1241   CO2 30 10/17/2013 1241   BUN 10 10/17/2013 1241   CREATININE 1.1 10/17/2013 1241      Component Value Date/Time   CALCIUM 9.8 10/17/2013 1241   ALKPHOS 80 10/17/2013 1241   AST 26 10/17/2013 1241   ALT 12 10/17/2013 1241   BILITOT 1.1 10/17/2013 1241      Lab Results  Component Value Date   CHOL 232* 10/17/2013   HDL 66.30 10/17/2013   LDLCALC 112* 02/11/2010   LDLDIRECT 128.6 10/17/2013   TRIG 122.0 10/17/2013   CHOLHDL 3 10/17/2013   she eats out too much  May eat too much fried foods   Rest of labs looked good   Patient Active Problem List   Diagnosis Date Noted  . Anxiety disorder 10/17/2013  . Routine general medical examination at a health care facility 10/01/2012  . BACK PAIN 08/18/2010  . ESSENTIAL HYPERTENSION, BENIGN 01/29/2010  . HYPERLIPIDEMIA 10/09/2007  . OSTEOARTHRITIS, KNEE 10/09/2007   Past Medical History  Diagnosis Date  . Hyperlipidemia   . Hypertension     with some white coat component  . Fibroids     uterine  . Abnormal finding on Pap smear 2006    endometrial cells, biopsy ok  . History of pelvic ultrasound 10/2007    uterine fibroids  . Herpes zoster     mild case  . Anxiety     Past Surgical History  Procedure Laterality Date  . Knee surgery  2002  . Iud removal  2006  . Wisdom tooth extraction    . Cryoablation  1982    abnormal pap   History  Substance Use Topics  . Smoking status: Never Smoker   . Smokeless tobacco: Never Used  . Alcohol Use: No   Family History  Problem Relation Age of Onset  . Glaucoma Father   . Heart disease Mother   . Heart attack Mother     stent  . Hyperlipidemia Mother   . Colon cancer Mother   . Hypertension Mother   . Cancer Mother     colon cancer  . Goiter Daughter    No Known Allergies Current Outpatient Prescriptions on File Prior to Visit  Medication Sig Dispense Refill  . hydrochlorothiazide (MICROZIDE) 12.5 MG capsule TAKE 1 CAPSULE BY MOUTH EVERY MORNING  90 capsule  3  . IUD's (PARAGARD INTRAUTERINE COPPER) IUD IUD 1 each by Intrauterine route once.      . Multiple  Vitamin (MULTIVITAMIN) capsule Take 1 capsule by mouth daily.         No current facility-administered medications on file prior to visit.    Review of Systems Review of Systems  Constitutional: Negative for fever, appetite change,  and unexpected weight change. pos for fatigue  Eyes: Negative for pain and visual disturbance.  Respiratory: Negative for cough and shortness of breath.   Cardiovascular: Negative for cp or palpitations    Gastrointestinal: Negative for nausea, diarrhea and constipation.  Genitourinary: Negative for urgency and frequency.  Skin: Negative for pallor or rash   Neurological: Negative for weakness, light-headedness, numbness and headaches.  Hematological: Negative for adenopathy. Does not bruise/bleed easily.  Psychiatric/Behavioral: Negative for dysphoric mood. The patient is not nervous/anxious.  pos for caregiver stress        Objective:   Physical Exam  Constitutional: She appears well-developed and well-nourished. No distress.  HENT:  Head: Normocephalic and atraumatic.  Mouth/Throat: Oropharynx is  clear and moist.  Eyes: Conjunctivae and EOM are normal. Pupils are equal, round, and reactive to light. No scleral icterus.  Neck: Normal range of motion. Neck supple. No JVD present. Carotid bruit is not present. No thyromegaly present.  Cardiovascular: Normal rate, regular rhythm and intact distal pulses.  Exam reveals no gallop.   No murmur heard. Pulmonary/Chest: Effort normal and breath sounds normal. No respiratory distress. She has no wheezes. She has no rales.  Abdominal: Soft. Bowel sounds are normal.  Musculoskeletal: She exhibits no edema.  Lymphadenopathy:    She has no cervical adenopathy.  Neurological: She is alert. She has normal reflexes. No cranial nerve deficit. She exhibits normal muscle tone. Coordination normal.  Skin: Skin is warm and dry. No rash noted. No erythema. No pallor.  Psychiatric: She has a normal mood and affect.          Assessment & Plan:

## 2013-11-23 NOTE — Patient Instructions (Addendum)
Try to watch sodium in diet for blood pressure  Also watch cholesterol in diet Stay active  Take zoloft daily in the evening    Fat and Cholesterol Control Diet Fat and cholesterol levels in your blood and organs are influenced by your diet. High levels of fat and cholesterol may lead to diseases of the heart, small and large blood vessels, gallbladder, liver, and pancreas. CONTROLLING FAT AND CHOLESTEROL WITH DIET Although exercise and lifestyle factors are important, your diet is key. That is because certain foods are known to raise cholesterol and others to lower it. The goal is to balance foods for their effect on cholesterol and more importantly, to replace saturated and trans fat with other types of fat, such as monounsaturated fat, polyunsaturated fat, and omega-3 fatty acids. On average, a person should consume no more than 15 to 17 g of saturated fat daily. Saturated and trans fats are considered "bad" fats, and they will raise LDL cholesterol. Saturated fats are primarily found in animal products such as meats, butter, and cream. However, that does not mean you need to give up all your favorite foods. Today, there are good tasting, low-fat, low-cholesterol substitutes for most of the things you like to eat. Choose low-fat or nonfat alternatives. Choose round or loin cuts of red meat. These types of cuts are lowest in fat and cholesterol. Chicken (without the skin), fish, veal, and ground [place] breast are great choices. Eliminate fatty meats, such as hot dogs and salami. Even shellfish have little or no saturated fat. Have a 3 oz (85 g) portion when you eat lean meat, poultry, or fish. Trans fats are also called "partially hydrogenated oils." They are oils that have been scientifically manipulated so that they are solid at room temperature resulting in a longer shelf life and improved taste and texture of foods in which they are added. Trans fats are found in stick margarine, some tub  margarines, cookies, crackers, and baked goods.  When baking and cooking, oils are a great substitute for butter. The monounsaturated oils are especially beneficial since it is believed they lower LDL and raise HDL. The oils you should avoid entirely are saturated tropical oils, such as coconut and palm.  Remember to eat a lot from food groups that are naturally free of saturated and trans fat, including fish, fruit, vegetables, beans, grains (barley, rice, couscous, bulgur wheat), and pasta (without cream sauces).  IDENTIFYING FOODS THAT LOWER FAT AND CHOLESTEROL  Soluble fiber may lower your cholesterol. This type of fiber is found in fruits such as apples, vegetables such as broccoli, potatoes, and carrots, legumes such as beans, peas, and lentils, and grains such as barley. Foods fortified with plant sterols (phytosterol) may also lower cholesterol. You should eat at least 2 g per day of these foods for a cholesterol lowering effect.  Read package labels to identify low-saturated fats, trans fat free, and low-fat foods at the supermarket. Select cheeses that have only 2 to 3 g saturated fat per ounce. Use a heart-healthy tub margarine that is free of trans fats or partially hydrogenated oil. When buying baked goods (cookies, crackers), avoid partially hydrogenated oils. Breads and muffins should be made from whole grains (whole-wheat or whole oat flour, instead of "flour" or "enriched flour"). Buy non-creamy canned soups with reduced salt and no added fats.  FOOD PREPARATION TECHNIQUES  Never deep-fry. If you must fry, either stir-fry, which uses very little fat, or use non-stick cooking sprays. When possible, broil, bake, or  roast meats, and steam vegetables. Instead of putting butter or margarine on vegetables, use lemon and herbs, applesauce, and cinnamon (for squash and sweet potatoes). Use nonfat yogurt, salsa, and low-fat dressings for salads.  LOW-SATURATED FAT / LOW-FAT FOOD SUBSTITUTES Meats /  Saturated Fat (g)  Avoid: Steak, marbled (3 oz/85 g) / 11 g  Choose: Steak, lean (3 oz/85 g) / 4 g  Avoid: Hamburger (3 oz/85 g) / 7 g  Choose: Hamburger, lean (3 oz/85 g) / 5 g  Avoid: Ham (3 oz/85 g) / 6 g  Choose: Ham, lean cut (3 oz/85 g) / 2.4 g  Avoid: Chicken, with skin, dark meat (3 oz/85 g) / 4 g  Choose: Chicken, skin removed, dark meat (3 oz/85 g) / 2 g  Avoid: Chicken, with skin, light meat (3 oz/85 g) / 2.5 g  Choose: Chicken, skin removed, light meat (3 oz/85 g) / 1 g Dairy / Saturated Fat (g)  Avoid: Whole milk (1 cup) / 5 g  Choose: Low-fat milk, 2% (1 cup) / 3 g  Choose: Low-fat milk, 1% (1 cup) / 1.5 g  Choose: Skim milk (1 cup) / 0.3 g  Avoid: Hard cheese (1 oz/28 g) / 6 g  Choose: Skim milk cheese (1 oz/28 g) / 2 to 3 g  Avoid: Cottage cheese, 4% fat (1 cup) / 6.5 g  Choose: Low-fat cottage cheese, 1% fat (1 cup) / 1.5 g  Avoid: Ice cream (1 cup) / 9 g  Choose: Sherbet (1 cup) / 2.5 g  Choose: Nonfat frozen yogurt (1 cup) / 0.3 g  Choose: Frozen fruit bar / trace  Avoid: Whipped cream (1 tbs) / 3.5 g  Choose: Nondairy whipped topping (1 tbs) / 1 g Condiments / Saturated Fat (g)  Avoid: Mayonnaise (1 tbs) / 2 g  Choose: Low-fat mayonnaise (1 tbs) / 1 g  Avoid: Butter (1 tbs) / 7 g  Choose: Extra light margarine (1 tbs) / 1 g  Avoid: Coconut oil (1 tbs) / 11.8 g  Choose: Olive oil (1 tbs) / 1.8 g  Choose: Corn oil (1 tbs) / 1.7 g  Choose: Safflower oil (1 tbs) / 1.2 g  Choose: Sunflower oil (1 tbs) / 1.4 g  Choose: Soybean oil (1 tbs) / 2.4 g  Choose: Canola oil (1 tbs) / 1 g Document Released: 09/27/2005 Document Revised: 01/22/2013 Document Reviewed: 03/18/2011 [employer] Patient Information 2014 [place], [place].

## 2013-11-25 NOTE — Assessment & Plan Note (Signed)
BP: 138/92 mmHg  bp in fair control at this time  No changes needed Disc lifstyle change with low sodium diet and exercise   Lab reviewed

## 2013-11-25 NOTE — Assessment & Plan Note (Signed)
zoloft daily is helping but sedating Suggested changing dose from am to pm -should help with sleep Update if no further imp Reviewed stressors/ coping techniques/symptoms/ support sources/ tx options and side effects in detail today  Offered counseling

## 2013-11-26 ENCOUNTER — Telehealth: Payer: Self-pay | Admitting: Family Medicine

## 2013-11-26 NOTE — Telephone Encounter (Signed)
Relevant patient education mailed to patient.  

## 2014-05-01 ENCOUNTER — Ambulatory Visit (INDEPENDENT_AMBULATORY_CARE_PROVIDER_SITE_OTHER): Payer: Federal, State, Local not specified - PPO | Admitting: Internal Medicine

## 2014-05-01 ENCOUNTER — Encounter: Payer: Self-pay | Admitting: Internal Medicine

## 2014-05-01 VITALS — BP 128/76 | HR 78 | Temp 98.4°F | Wt 175.0 lb

## 2014-05-01 DIAGNOSIS — S90569A Insect bite (nonvenomous), unspecified ankle, initial encounter: Secondary | ICD-10-CM

## 2014-05-01 DIAGNOSIS — S80862A Insect bite (nonvenomous), left lower leg, initial encounter: Secondary | ICD-10-CM

## 2014-05-01 DIAGNOSIS — W57XXXA Bitten or stung by nonvenomous insect and other nonvenomous arthropods, initial encounter: Secondary | ICD-10-CM

## 2014-05-01 MED ORDER — METHYLPREDNISOLONE ACETATE 80 MG/ML IJ SUSP
80.0000 mg | Freq: Once | INTRAMUSCULAR | Status: AC
  Administered 2014-05-01: 80 mg via INTRAMUSCULAR

## 2014-05-01 NOTE — Addendum Note (Signed)
Addended by: Lurlean Nanny on: 05/01/2014 03:08 PM   Modules accepted: Orders

## 2014-05-01 NOTE — Progress Notes (Signed)
Pre visit review using our clinic review tool, if applicable. No additional management support is needed unless otherwise documented below in the visit note. 

## 2014-05-01 NOTE — Patient Instructions (Addendum)

## 2014-05-01 NOTE — Progress Notes (Signed)
Subjective:    Patient ID: [name], female    DOB: 05-21-1961, 53 y.o.   MRN: [id number]  HPI  Pt presents to the clinic today with c/o leg swelling. She reports this started after a bee sting that occurred 3 weeks ago. She has noticed swelling and itching of the area. She has tried to put hydrocortisone cream on it. She also tried allergy medication without relief.  Review of Systems  Past Medical History  Diagnosis Date  . Hyperlipidemia   . Hypertension     with some white coat component  . Fibroids     uterine  . Abnormal finding on Pap smear 2006    endometrial cells, biopsy ok  . History of pelvic ultrasound 10/2007    uterine fibroids  . Herpes zoster     mild case  . Anxiety     Current Outpatient Prescriptions  Medication Sig Dispense Refill  . hydrochlorothiazide (MICROZIDE) 12.5 MG capsule TAKE 1 CAPSULE BY MOUTH EVERY MORNING  90 capsule  3  . IUD's (PARAGARD INTRAUTERINE COPPER) IUD IUD 1 each by Intrauterine route once.      . Multiple Vitamin (MULTIVITAMIN) capsule Take 1 capsule by mouth daily.        . sertraline (ZOLOFT) 50 MG tablet Take 1 tablet (50 mg total) by mouth at bedtime.  30 tablet  11   No current facility-administered medications for this visit.    No Known Allergies  Family History  Problem Relation Age of Onset  . Glaucoma Father   . Heart disease Mother   . Heart attack Mother     stent  . Hyperlipidemia Mother   . Colon cancer Mother   . Hypertension Mother   . Cancer Mother     colon cancer  . Goiter Daughter     History   Social History  . Marital Status: Single    Spouse Name: N/A    Number of Children: 2  . Years of Education: N/A   Occupational History  . [employer]    Social History Main Topics  . Smoking status: Never Smoker   . Smokeless tobacco: Never Used  . Alcohol Use: No  . Drug Use: No  . Sexual Activity: Yes    Partners: Male    Birth Control/ Protection: IUD   Other Topics Concern  . Not on file     Social History Narrative   Exercises 4 x a week, cardio and strength training     Constitutional: Denies fever, malaise, fatigue, headache or abrupt weight changes. .  Skin: Pt reports bee sting. Denies rashes, lesions or ulcercations.    No other specific complaints in a complete review of systems (except as listed in HPI above).     Objective:   Physical Exam   BP 128/76  Pulse 78  Temp(Src) 98.4 F (36.9 C) (Oral)  Wt 175 lb (79.379 kg)  SpO2 98% Wt Readings from Last 3 Encounters:  05/01/14 175 lb (79.379 kg)  11/23/13 172 lb 12 oz (78.359 kg)  10/17/13 173 lb 12 oz (78.812 kg)    General: Appears her stated age, well developed, well nourished in NAD. Skin: Warm, dry and intact. Multiple erythematous lesion noted on left lower leg, no cellulitis noted.  BMET    Component Value Date/Time   NA 138 10/17/2013 1241   K 3.9 10/17/2013 1241   CL 101 10/17/2013 1241   CO2 30 10/17/2013 1241   GLUCOSE 89 10/17/2013 1241  BUN 10 10/17/2013 1241   CREATININE 1.1 10/17/2013 1241   CALCIUM 9.8 10/17/2013 1241   GFRNONAA 75.08 02/11/2010 1110   GFRAA 86 08/20/2008 0854    Lipid Panel     Component Value Date/Time   CHOL 232* 10/17/2013 1241   TRIG 122.0 10/17/2013 1241   HDL 66.30 10/17/2013 1241   CHOLHDL 3 10/17/2013 1241   VLDL 24.4 10/17/2013 1241   LDLCALC 112* 02/11/2010 1110    CBC    Component Value Date/Time   WBC 7.3 10/17/2013 1241   RBC 4.21 10/17/2013 1241   HGB 13.7 10/17/2013 1241   HCT 40.5 10/17/2013 1241   PLT 248.0 10/17/2013 1241   MCV 96.2 10/17/2013 1241   MCHC 33.8 10/17/2013 1241   RDW 12.9 10/17/2013 1241   LYMPHSABS 2.4 10/17/2013 1241   MONOABS 0.7 10/17/2013 1241   EOSABS 0.2 10/17/2013 1241   BASOSABS 0.0 10/17/2013 1241    Hgb A1C No results found for this basename: HGBA1C        Assessment & Plan:   Insect bite of lower leg, not infected but with inflammation:  Will try 80 mg Depo IM today OK to continue hydrocortisone and allergy medication  RTC as  needed

## 2014-05-29 ENCOUNTER — Encounter: Payer: Self-pay | Admitting: Family Medicine

## 2014-05-29 ENCOUNTER — Ambulatory Visit (INDEPENDENT_AMBULATORY_CARE_PROVIDER_SITE_OTHER): Payer: Federal, State, Local not specified - PPO | Admitting: Family Medicine

## 2014-05-29 VITALS — BP 140/82 | HR 70 | Temp 97.8°F | Ht 65.25 in | Wt 176.8 lb

## 2014-05-29 DIAGNOSIS — R21 Rash and other nonspecific skin eruption: Secondary | ICD-10-CM

## 2014-05-29 MED ORDER — MOMETASONE FUROATE 0.1 % EX CREA: 1.0000 "application " | TOPICAL_CREAM | Freq: Every day | CUTANEOUS | Status: DC

## 2014-05-29 NOTE — Progress Notes (Signed)
Subjective:    Patient ID: [name], female    DOB: Feb 09, 1961, 53 y.o.   MRN: [id number]  HPI Here with rash on her arms Bumps that are itchy  Starts as itching "on the inside" -then she would whelp up and then she would notice more coming up along with some white bumps  She tried zyrtec otc - helps a little bit   She did have bee stings recently on the back of her leg     bp is up today- then checked it at rest- better at 140/82 BP Readings from Last 3 Encounters:  05/29/14 163/95  05/01/14 128/76  11/23/13 138/92    Her mother passed away Her stress is better now  Had to stop zoloft due to side effects anyway- does not need it now   Patient Active Problem List   Diagnosis Date Noted  . Rash and nonspecific skin eruption 05/29/2014  . Anxiety disorder 10/17/2013  . Routine general medical examination at a health care facility 10/01/2012  . BACK PAIN 08/18/2010  . ESSENTIAL HYPERTENSION, BENIGN 01/29/2010  . HYPERLIPIDEMIA 10/09/2007  . OSTEOARTHRITIS, KNEE 10/09/2007   Past Medical History  Diagnosis Date  . Hyperlipidemia   . Hypertension     with some white coat component  . Fibroids     uterine  . Abnormal finding on Pap smear 2006    endometrial cells, biopsy ok  . History of pelvic ultrasound 10/2007    uterine fibroids  . Herpes zoster     mild case  . Anxiety    Past Surgical History  Procedure Laterality Date  . Knee surgery  2002  . Iud removal  2006  . Wisdom tooth extraction    . Cryoablation  1982    abnormal pap   History  Substance Use Topics  . Smoking status: Never Smoker   . Smokeless tobacco: Never Used  . Alcohol Use: No   Family History  Problem Relation Age of Onset  . Glaucoma Father   . Heart disease Mother   . Heart attack Mother     stent  . Hyperlipidemia Mother   . Colon cancer Mother   . Hypertension Mother   . Cancer Mother     colon cancer  . Goiter Daughter    No Known Allergies Current Outpatient  Prescriptions on File Prior to Visit  Medication Sig Dispense Refill  . hydrochlorothiazide (MICROZIDE) 12.5 MG capsule TAKE 1 CAPSULE BY MOUTH EVERY MORNING  90 capsule  3  . IUD's (PARAGARD INTRAUTERINE COPPER) IUD IUD 1 each by Intrauterine route once.      . Multiple Vitamin (MULTIVITAMIN) capsule Take 1 capsule by mouth daily.         No current facility-administered medications on file prior to visit.    Review of Systems    Review of Systems  Constitutional: Negative for fever, appetite change, fatigue and unexpected weight change.  Eyes: Negative for pain and visual disturbance.  Respiratory: Negative for cough and shortness of breath.   Cardiovascular: Negative for cp or palpitations    Gastrointestinal: Negative for nausea, diarrhea and constipation.  Genitourinary: Negative for urgency and frequency.  Skin: Negative for pallor and pos for  rash   Neurological: Negative for weakness, light-headedness, numbness and headaches.  Hematological: Negative for adenopathy. Does not bruise/bleed easily.  Psychiatric/Behavioral: Negative for dysphoric mood. The patient is not nervous/anxious.      Objective:   Physical Exam  Constitutional: She appears well-developed  and well-nourished. No distress.  Eyes: Conjunctivae and EOM are normal. Pupils are equal, round, and reactive to light.  Neck: Normal range of motion. Neck supple.  Cardiovascular: Normal rate and regular rhythm.   Pulmonary/Chest: Effort normal and breath sounds normal.  Musculoskeletal: She exhibits no edema.  Lymphadenopathy:    She has no cervical adenopathy.  Neurological: She is alert.  Skin: Skin is warm and dry. Rash noted. No erythema. No pallor.  Hives on lower R arm and wrist  Papules with scabs on R upper arm -healing No signs of infection  Insect sting on L post leg is scabbed over and difficult to see   No other skin lesions  Psychiatric: She has a normal mood and affect.            Assessment & Plan:   Problem List Items Addressed This Visit     Musculoskeletal and Integument   Rash and nonspecific skin eruption - Primary     Some scabs on R upper arm - and whelps at wrist  Suspect she has scratched some hives ? If rel to prior bee stings or other allergen or insect  Adv to continue zyrtec  Given elocon cream to use on scabbed areas -watching for infection  Pt declines prednisone due to side eff- but will call if she thinks she needs it

## 2014-05-29 NOTE — Progress Notes (Signed)
Pre visit review using our clinic review tool, if applicable. No additional management support is needed unless otherwise documented below in the visit note. 

## 2014-05-29 NOTE — Assessment & Plan Note (Signed)
Some scabs on R upper arm - and whelps at wrist  Suspect she has scratched some hives ? If rel to prior bee stings or other allergen or insect  Adv to continue zyrtec  Given elocon cream to use on scabbed areas -watching for infection  Pt declines prednisone due to side eff- but will call if she thinks she needs it

## 2014-05-29 NOTE — Patient Instructions (Addendum)
I think you have had some hives and the scabs are from scratching  Cannot rule out insect bites from hiking  A delayed reaction to a bee sting can do this also  Use elocon cream once daily to affected areas  Continue zyrtec at least 2 more weeks Update me if worse- we would consider prednisone  Keep the itchy areas cool

## 2014-06-06 ENCOUNTER — Ambulatory Visit: Payer: Federal, State, Local not specified - PPO | Admitting: Obstetrics and Gynecology

## 2014-06-14 ENCOUNTER — Ambulatory Visit (INDEPENDENT_AMBULATORY_CARE_PROVIDER_SITE_OTHER): Payer: Federal, State, Local not specified - PPO | Admitting: Obstetrics and Gynecology

## 2014-06-14 ENCOUNTER — Encounter: Payer: Self-pay | Admitting: Obstetrics and Gynecology

## 2014-06-14 VITALS — BP 151/94 | HR 70 | Ht 66.0 in | Wt 175.0 lb

## 2014-06-14 DIAGNOSIS — Z124 Encounter for screening for malignant neoplasm of cervix: Secondary | ICD-10-CM

## 2014-06-14 DIAGNOSIS — Z01419 Encounter for gynecological examination (general) (routine) without abnormal findings: Secondary | ICD-10-CM

## 2014-06-14 DIAGNOSIS — Z1151 Encounter for screening for human papillomavirus (HPV): Secondary | ICD-10-CM | POA: Diagnosis not present

## 2014-06-14 NOTE — Progress Notes (Signed)
  Subjective:     [name] is a 53 y.o. female G2P2 postmenopausal x 1 year with BMI 28 who is here for a comprehensive physical exam. The patient reports no problems. Patient is not sexually active at this time. She has a Paraguard in place that she thinks has been there for 10 years.  History   Social History  . Marital Status: Single    Spouse Name: N/A    Number of Children: 2  . Years of Education: N/A   Occupational History  . [employer]    Social History Main Topics  . Smoking status: Never Smoker   . Smokeless tobacco: Never Used  . Alcohol Use: No  . Drug Use: No  . Sexual Activity: Not Currently    Partners: Male    Birth Control/ Protection: IUD   Other Topics Concern  . Not on file   Social History Narrative   Exercises 4 x a week, cardio and strength training   Health Maintenance  Topic Date Due  . Influenza Vaccine  01/09/2015 (Originally 05/11/2014)  . Mammogram  10/17/2014  . Pap Smear  05/27/2015  . Colonoscopy  03/18/2019  . Tetanus/tdap  10/10/2022   Past Medical History  Diagnosis Date  . Hyperlipidemia   . Hypertension     with some white coat component  . Fibroids     uterine  . Abnormal finding on Pap smear 2006    endometrial cells, biopsy ok  . History of pelvic ultrasound 10/2007    uterine fibroids  . Herpes zoster     mild case  . Anxiety    Past Surgical History  Procedure Laterality Date  . Knee surgery  2002  . Iud removal  2006  . Wisdom tooth extraction    . Cryoablation  1982    abnormal pap   Family History  Problem Relation Age of Onset  . Glaucoma Father   . Heart disease Mother   . Heart attack Mother     stent  . Hyperlipidemia Mother   . Colon cancer Mother   . Hypertension Mother   . Cancer Mother     colon cancer  . Stroke Mother   . Goiter Daughter        Review of Systems A comprehensive review of systems was negative.   Objective:      GENERAL: Well-developed, well-nourished female in no acute  distress.  HEENT: Normocephalic, atraumatic. Sclerae anicteric.  NECK: Supple. Normal thyroid.  LUNGS: Clear to auscultation bilaterally.  HEART: Regular rate and rhythm. BREASTS: Symmetric in size. No palpable masses or lymphadenopathy, skin changes, or nipple drainage. ABDOMEN: Soft, nontender, nondistended. No organomegaly. PELVIC: Normal external female genitalia. Vagina is pink and rugated.  Normal discharge. Normal appearing cervix with IUD strings visualized from external os. Uterus is normal in size. No adnexal mass or tenderness. EXTREMITIES: No cyanosis, clubbing, or edema, 2+ distal pulses.    Assessment:    Healthy female exam.      Plan:    pap smear collected Patient advised to perform monthly self breast and vulva exams Patient had a normal mammogram in December 2014. IUD inserted on 12/06/2006. Patient may keep it in place for another 3 years See After Visit Summary for Counseling Recommendations

## 2014-06-14 NOTE — Patient Instructions (Signed)
Preventive Care for Adults A healthy lifestyle and preventive care can promote health and wellness. Preventive health guidelines for women include the following key practices.  A routine yearly physical is a good way to check with your health care provider about your health and preventive screening. It is a chance to share any concerns and updates on your health and to receive a thorough exam.  Visit your dentist for a routine exam and preventive care every 6 months. Brush your teeth twice a day and floss once a day. Good oral hygiene prevents tooth decay and gum disease.  The frequency of eye exams is based on your age, health, family medical history, use of contact lenses, and other factors. Follow your health care provider's recommendations for frequency of eye exams.  Eat a healthy diet. Foods like vegetables, fruits, whole grains, low-fat dairy products, and lean protein foods contain the nutrients you need without too many calories. Decrease your intake of foods high in solid fats, added sugars, and salt. Eat the right amount of calories for you.Get information about a proper diet from your health care provider, if necessary.  Regular physical exercise is one of the most important things you can do for your health. Most adults should get at least 150 minutes of moderate-intensity exercise (any activity that increases your heart rate and causes you to sweat) each week. In addition, most adults need muscle-strengthening exercises on 2 or more days a week.  Maintain a healthy weight. The body mass index (BMI) is a screening tool to identify possible weight problems. It provides an estimate of body fat based on height and weight. Your health care provider can find your BMI and can help you achieve or maintain a healthy weight.For adults 20 years and older:  A BMI below 18.5 is considered underweight.  A BMI of 18.5 to 24.9 is normal.  A BMI of 25 to 29.9 is considered overweight.  A BMI of  30 and above is considered obese.  Maintain normal blood lipids and cholesterol levels by exercising and minimizing your intake of saturated fat. Eat a balanced diet with plenty of fruit and vegetables. Blood tests for lipids and cholesterol should begin at age 20 and be repeated every 5 years. If your lipid or cholesterol levels are high, you are over 50, or you are at high risk for heart disease, you may need your cholesterol levels checked more frequently.Ongoing high lipid and cholesterol levels should be treated with medicines if diet and exercise are not working.  If you smoke, find out from your health care provider how to quit. If you do not use tobacco, do not start.  Lung cancer screening is recommended for adults aged 55-80 years who are at high risk for developing lung cancer because of a history of smoking. A yearly low-dose CT scan of the lungs is recommended for people who have at least a 30-pack-year history of smoking and are a current smoker or have quit within the past 15 years. A pack year of smoking is smoking an average of 1 pack of cigarettes a day for 1 year (for example: 1 pack a day for 30 years or 2 packs a day for 15 years). Yearly screening should continue until the smoker has stopped smoking for at least 15 years. Yearly screening should be stopped for people who develop a health problem that would prevent them from having lung cancer treatment.  If you are pregnant, do not drink alcohol. If you are breastfeeding,   be very cautious about drinking alcohol. If you are not pregnant and choose to drink alcohol, do not have more than 1 drink per day. One drink is considered to be 12 ounces (355 mL) of beer, 5 ounces (148 mL) of wine, or 1.5 ounces (44 mL) of liquor.  Avoid use of street drugs. Do not share needles with anyone. Ask for help if you need support or instructions about stopping the use of drugs.  High blood pressure causes heart disease and increases the risk of  stroke. Your blood pressure should be checked at least every 1 to 2 years. Ongoing high blood pressure should be treated with medicines if weight loss and exercise do not work.  If you are 75-52 years old, ask your health care provider if you should take aspirin to prevent strokes.  Diabetes screening involves taking a blood sample to check your fasting blood sugar level. This should be done once every 3 years, after age 15, if you are within normal weight and without risk factors for diabetes. Testing should be considered at a younger age or be carried out more frequently if you are overweight and have at least 1 risk factor for diabetes.  Breast cancer screening is essential preventive care for women. You should practice "breast self-awareness." This means understanding the normal appearance and feel of your breasts and may include breast self-examination. Any changes detected, no matter how small, should be reported to a health care provider. Women in their 58s and 30s should have a clinical breast exam (CBE) by a health care provider as part of a regular health exam every 1 to 3 years. After age 16, women should have a CBE every year. Starting at age 53, women should consider having a mammogram (breast X-ray test) every year. Women who have a family history of breast cancer should talk to their health care provider about genetic screening. Women at a high risk of breast cancer should talk to their health care providers about having an MRI and a mammogram every year.  Breast cancer gene (BRCA)-related cancer risk assessment is recommended for women who have family members with BRCA-related cancers. BRCA-related cancers include breast, ovarian, tubal, and peritoneal cancers. Having family members with these cancers may be associated with an increased risk for harmful changes (mutations) in the breast cancer genes BRCA1 and BRCA2. Results of the assessment will determine the need for genetic counseling and  BRCA1 and BRCA2 testing.  Routine pelvic exams to screen for cancer are no longer recommended for nonpregnant women who are considered low risk for cancer of the pelvic organs (ovaries, uterus, and vagina) and who do not have symptoms. Ask your health care provider if a screening pelvic exam is right for you.  If you have had past treatment for cervical cancer or a condition that could lead to cancer, you need Pap tests and screening for cancer for at least 20 years after your treatment. If Pap tests have been discontinued, your risk factors (such as having a new sexual partner) need to be reassessed to determine if screening should be resumed. Some women have medical problems that increase the chance of getting cervical cancer. In these cases, your health care provider may recommend more frequent screening and Pap tests.  The HPV test is an additional test that may be used for cervical cancer screening. The HPV test looks for the virus that can cause the cell changes on the cervix. The cells collected during the Pap test can be  tested for HPV. The HPV test could be used to screen women aged 30 years and older, and should be used in women of any age who have unclear Pap test results. After the age of 30, women should have HPV testing at the same frequency as a Pap test.  Colorectal cancer can be detected and often prevented. Most routine colorectal cancer screening begins at the age of 50 years and continues through age 75 years. However, your health care provider may recommend screening at an earlier age if you have risk factors for colon cancer. On a yearly basis, your health care provider may provide home test kits to check for hidden blood in the stool. Use of a small camera at the end of a tube, to directly examine the colon (sigmoidoscopy or colonoscopy), can detect the earliest forms of colorectal cancer. Talk to your health care provider about this at age 50, when routine screening begins. Direct  exam of the colon should be repeated every 5-10 years through age 75 years, unless early forms of pre-cancerous polyps or small growths are found.  People who are at an increased risk for hepatitis B should be screened for this virus. You are considered at high risk for hepatitis B if:  You were born in a country where hepatitis B occurs often. Talk with your health care provider about which countries are considered high risk.  Your parents were born in a high-risk country and you have not received a shot to protect against hepatitis B (hepatitis B vaccine).  You have HIV or AIDS.  You use needles to inject street drugs.  You live with, or have sex with, someone who has hepatitis B.  You get hemodialysis treatment.  You take certain medicines for conditions like cancer, organ transplantation, and autoimmune conditions.  Hepatitis C blood testing is recommended for all people born from 1945 through 1965 and any individual with known risks for hepatitis C.  Practice safe sex. Use condoms and avoid high-risk sexual practices to reduce the spread of sexually transmitted infections (STIs). STIs include gonorrhea, chlamydia, syphilis, trichomonas, herpes, HPV, and human immunodeficiency virus (HIV). Herpes, HIV, and HPV are viral illnesses that have no cure. They can result in disability, cancer, and death.  You should be screened for sexually transmitted illnesses (STIs) including gonorrhea and chlamydia if:  You are sexually active and are younger than 24 years.  You are older than 24 years and your health care provider tells you that you are at risk for this type of infection.  Your sexual activity has changed since you were last screened and you are at an increased risk for chlamydia or gonorrhea. Ask your health care provider if you are at risk.  If you are at risk of being infected with HIV, it is recommended that you take a prescription medicine daily to prevent HIV infection. This is  called preexposure prophylaxis (PrEP). You are considered at risk if:  You are a heterosexual woman, are sexually active, and are at increased risk for HIV infection.  You take drugs by injection.  You are sexually active with a partner who has HIV.  Talk with your health care provider about whether you are at high risk of being infected with HIV. If you choose to begin PrEP, you should first be tested for HIV. You should then be tested every 3 months for as long as you are taking PrEP.  Osteoporosis is a disease in which the bones lose minerals and strength   with aging. This can result in serious bone fractures or breaks. The risk of osteoporosis can be identified using a bone density scan. Women ages 65 years and over and women at risk for fractures or osteoporosis should discuss screening with their health care providers. Ask your health care provider whether you should take a calcium supplement or vitamin D to reduce the rate of osteoporosis.  Menopause can be associated with physical symptoms and risks. Hormone replacement therapy is available to decrease symptoms and risks. You should talk to your health care provider about whether hormone replacement therapy is right for you.  Use sunscreen. Apply sunscreen liberally and repeatedly throughout the day. You should seek shade when your shadow is shorter than you. Protect yourself by wearing long sleeves, pants, a wide-brimmed hat, and sunglasses year round, whenever you are outdoors.  Once a month, do a whole body skin exam, using a mirror to look at the skin on your back. Tell your health care provider of new moles, moles that have irregular borders, moles that are larger than a pencil eraser, or moles that have changed in shape or color.  Stay current with required vaccines (immunizations).  Influenza vaccine. All adults should be immunized every year.  Tetanus, diphtheria, and acellular pertussis (Td, Tdap) vaccine. Pregnant women should  receive 1 dose of Tdap vaccine during each pregnancy. The dose should be obtained regardless of the length of time since the last dose. Immunization is preferred during the 27th-36th week of gestation. An adult who has not previously received Tdap or who does not know her vaccine status should receive 1 dose of Tdap. This initial dose should be followed by tetanus and diphtheria toxoids (Td) booster doses every 10 years. Adults with an unknown or incomplete history of completing a 3-dose immunization series with Td-containing vaccines should begin or complete a primary immunization series including a Tdap dose. Adults should receive a Td booster every 10 years.  Varicella vaccine. An adult without evidence of immunity to varicella should receive 2 doses or a second dose if she has previously received 1 dose. Pregnant females who do not have evidence of immunity should receive the first dose after pregnancy. This first dose should be obtained before leaving the health care facility. The second dose should be obtained 4-8 weeks after the first dose.  Human papillomavirus (HPV) vaccine. Females aged 13-26 years who have not received the vaccine previously should obtain the 3-dose series. The vaccine is not recommended for use in pregnant females. However, pregnancy testing is not needed before receiving a dose. If a female is found to be pregnant after receiving a dose, no treatment is needed. In that case, the remaining doses should be delayed until after the pregnancy. Immunization is recommended for any person with an immunocompromised condition through the age of 26 years if she did not get any or all doses earlier. During the 3-dose series, the second dose should be obtained 4-8 weeks after the first dose. The third dose should be obtained 24 weeks after the first dose and 16 weeks after the second dose.  Zoster vaccine. One dose is recommended for adults aged 60 years or older unless certain conditions are  present.  Measles, mumps, and rubella (MMR) vaccine. Adults born before 1957 generally are considered immune to measles and mumps. Adults born in 1957 or later should have 1 or more doses of MMR vaccine unless there is a contraindication to the vaccine or there is laboratory evidence of immunity to   each of the three diseases. A routine second dose of MMR vaccine should be obtained at least 28 days after the first dose for students attending postsecondary schools, health care workers, or international travelers. People who received inactivated measles vaccine or an unknown type of measles vaccine during 1963-1967 should receive 2 doses of MMR vaccine. People who received inactivated mumps vaccine or an unknown type of mumps vaccine before 1979 and are at high risk for mumps infection should consider immunization with 2 doses of MMR vaccine. For females of childbearing age, rubella immunity should be determined. If there is no evidence of immunity, females who are not pregnant should be vaccinated. If there is no evidence of immunity, females who are pregnant should delay immunization until after pregnancy. Unvaccinated health care workers born before 1957 who lack laboratory evidence of measles, mumps, or rubella immunity or laboratory confirmation of disease should consider measles and mumps immunization with 2 doses of MMR vaccine or rubella immunization with 1 dose of MMR vaccine.  Pneumococcal 13-valent conjugate (PCV13) vaccine. When indicated, a person who is uncertain of her immunization history and has no record of immunization should receive the PCV13 vaccine. An adult aged 19 years or older who has certain medical conditions and has not been previously immunized should receive 1 dose of PCV13 vaccine. This PCV13 should be followed with a dose of pneumococcal polysaccharide (PPSV23) vaccine. The PPSV23 vaccine dose should be obtained at least 8 weeks after the dose of PCV13 vaccine. An adult aged 19  years or older who has certain medical conditions and previously received 1 or more doses of PPSV23 vaccine should receive 1 dose of PCV13. The PCV13 vaccine dose should be obtained 1 or more years after the last PPSV23 vaccine dose.  Pneumococcal polysaccharide (PPSV23) vaccine. When PCV13 is also indicated, PCV13 should be obtained first. All adults aged 65 years and older should be immunized. An adult younger than age 65 years who has certain medical conditions should be immunized. Any person who resides in a nursing home or long-term care facility should be immunized. An adult smoker should be immunized. People with an immunocompromised condition and certain other conditions should receive both PCV13 and PPSV23 vaccines. People with human immunodeficiency virus (HIV) infection should be immunized as soon as possible after diagnosis. Immunization during chemotherapy or radiation therapy should be avoided. Routine use of PPSV23 vaccine is not recommended for American Indians, [place] Natives, or people younger than 65 years unless there are medical conditions that require PPSV23 vaccine. When indicated, people who have unknown immunization and have no record of immunization should receive PPSV23 vaccine. One-time revaccination 5 years after the first dose of PPSV23 is recommended for people aged 19-64 years who have chronic kidney failure, nephrotic syndrome, asplenia, or immunocompromised conditions. People who received 1-2 doses of PPSV23 before age 65 years should receive another dose of PPSV23 vaccine at age 65 years or later if at least 5 years have passed since the previous dose. Doses of PPSV23 are not needed for people immunized with PPSV23 at or after age 65 years.  Meningococcal vaccine. Adults with asplenia or persistent complement component deficiencies should receive 2 doses of quadrivalent meningococcal conjugate (MenACWY-D) vaccine. The doses should be obtained at least 2 months apart.  Microbiologists working with certain meningococcal bacteria, military recruits, people at risk during an outbreak, and people who travel to or live in countries with a high rate of meningitis should be immunized. A first-year college student up through age   21 years who is living in a residence hall should receive a dose if she did not receive a dose on or after her 16th birthday. Adults who have certain high-risk conditions should receive one or more doses of vaccine.  Hepatitis A vaccine. Adults who wish to be protected from this disease, have certain high-risk conditions, work with hepatitis A-infected animals, work in hepatitis A research labs, or travel to or work in countries with a high rate of hepatitis A should be immunized. Adults who were previously unvaccinated and who anticipate close contact with an international adoptee during the first 60 days after arrival in the [place] from a country with a high rate of hepatitis A should be immunized.  Hepatitis B vaccine. Adults who wish to be protected from this disease, have certain high-risk conditions, may be exposed to blood or other infectious body fluids, are household contacts or sex partners of hepatitis B positive people, are clients or workers in certain care facilities, or travel to or work in countries with a high rate of hepatitis B should be immunized.  Haemophilus influenzae type b (Hib) vaccine. A previously unvaccinated person with asplenia or sickle cell disease or having a scheduled splenectomy should receive 1 dose of Hib vaccine. Regardless of previous immunization, a recipient of a hematopoietic stem cell transplant should receive a 3-dose series 6-12 months after her successful transplant. Hib vaccine is not recommended for adults with HIV infection. Preventive Services / Frequency Ages 64 to 68 years  Blood pressure check.** / Every 1 to 2 years.  Lipid and cholesterol check.** / Every 5 years beginning at age  22.  Clinical breast exam.** / Every 3 years for women in their 88s and 53s.  BRCA-related cancer risk assessment.** / For women who have family members with a BRCA-related cancer (breast, ovarian, tubal, or peritoneal cancers).  Pap test.** / Every 2 years from ages 90 through 51. Every 3 years starting at age 21 through age 56 or 3 with a history of 3 consecutive normal Pap tests.  HPV screening.** / Every 3 years from ages 24 through ages 1 to 46 with a history of 3 consecutive normal Pap tests.  Hepatitis C blood test.** / For any individual with known risks for hepatitis C.  Skin self-exam. / Monthly.  Influenza vaccine. / Every year.  Tetanus, diphtheria, and acellular pertussis (Tdap, Td) vaccine.** / Consult your health care provider. Pregnant women should receive 1 dose of Tdap vaccine during each pregnancy. 1 dose of Td every 10 years.  Varicella vaccine.** / Consult your health care provider. Pregnant females who do not have evidence of immunity should receive the first dose after pregnancy.  HPV vaccine. / 3 doses over 6 months, if 72 and younger. The vaccine is not recommended for use in pregnant females. However, pregnancy testing is not needed before receiving a dose.  Measles, mumps, rubella (MMR) vaccine.** / You need at least 1 dose of MMR if you were born in 1957 or later. You may also need a 2nd dose. For females of childbearing age, rubella immunity should be determined. If there is no evidence of immunity, females who are not pregnant should be vaccinated. If there is no evidence of immunity, females who are pregnant should delay immunization until after pregnancy.  Pneumococcal 13-valent conjugate (PCV13) vaccine.** / Consult your health care provider.  Pneumococcal polysaccharide (PPSV23) vaccine.** / 1 to 2 doses if you smoke cigarettes or if you have certain conditions.  Meningococcal vaccine.** /  1 dose if you are age 19 to 21 years and a first-year college  student living in a residence hall, or have one of several medical conditions, you need to get vaccinated against meningococcal disease. You may also need additional booster doses.  Hepatitis A vaccine.** / Consult your health care provider.  Hepatitis B vaccine.** / Consult your health care provider.  Haemophilus influenzae type b (Hib) vaccine.** / Consult your health care provider. Ages 40 to 64 years  Blood pressure check.** / Every 1 to 2 years.  Lipid and cholesterol check.** / Every 5 years beginning at age 20 years.  Lung cancer screening. / Every year if you are aged 55-80 years and have a 30-pack-year history of smoking and currently smoke or have quit within the past 15 years. Yearly screening is stopped once you have quit smoking for at least 15 years or develop a health problem that would prevent you from having lung cancer treatment.  Clinical breast exam.** / Every year after age 40 years.  BRCA-related cancer risk assessment.** / For women who have family members with a BRCA-related cancer (breast, ovarian, tubal, or peritoneal cancers).  Mammogram.** / Every year beginning at age 40 years and continuing for as long as you are in good health. Consult with your health care provider.  Pap test.** / Every 3 years starting at age 30 years through age 65 or 70 years with a history of 3 consecutive normal Pap tests.  HPV screening.** / Every 3 years from ages 30 years through ages 65 to 70 years with a history of 3 consecutive normal Pap tests.  Fecal occult blood test (FOBT) of stool. / Every year beginning at age 50 years and continuing until age 75 years. You may not need to do this test if you get a colonoscopy every 10 years.  Flexible sigmoidoscopy or colonoscopy.** / Every 5 years for a flexible sigmoidoscopy or every 10 years for a colonoscopy beginning at age 50 years and continuing until age 75 years.  Hepatitis C blood test.** / For all people born from 1945 through  1965 and any individual with known risks for hepatitis C.  Skin self-exam. / Monthly.  Influenza vaccine. / Every year.  Tetanus, diphtheria, and acellular pertussis (Tdap/Td) vaccine.** / Consult your health care provider. Pregnant women should receive 1 dose of Tdap vaccine during each pregnancy. 1 dose of Td every 10 years.  Varicella vaccine.** / Consult your health care provider. Pregnant females who do not have evidence of immunity should receive the first dose after pregnancy.  Zoster vaccine.** / 1 dose for adults aged 60 years or older.  Measles, mumps, rubella (MMR) vaccine.** / You need at least 1 dose of MMR if you were born in 1957 or later. You may also need a 2nd dose. For females of childbearing age, rubella immunity should be determined. If there is no evidence of immunity, females who are not pregnant should be vaccinated. If there is no evidence of immunity, females who are pregnant should delay immunization until after pregnancy.  Pneumococcal 13-valent conjugate (PCV13) vaccine.** / Consult your health care provider.  Pneumococcal polysaccharide (PPSV23) vaccine.** / 1 to 2 doses if you smoke cigarettes or if you have certain conditions.  Meningococcal vaccine.** / Consult your health care provider.  Hepatitis A vaccine.** / Consult your health care provider.  Hepatitis B vaccine.** / Consult your health care provider.  Haemophilus influenzae type b (Hib) vaccine.** / Consult your health care provider. Ages 65   years and over  Blood pressure check.** / Every 1 to 2 years.  Lipid and cholesterol check.** / Every 5 years beginning at age 22 years.  Lung cancer screening. / Every year if you are aged 73-80 years and have a 30-pack-year history of smoking and currently smoke or have quit within the past 15 years. Yearly screening is stopped once you have quit smoking for at least 15 years or develop a health problem that would prevent you from having lung cancer  treatment.  Clinical breast exam.** / Every year after age 4 years.  BRCA-related cancer risk assessment.** / For women who have family members with a BRCA-related cancer (breast, ovarian, tubal, or peritoneal cancers).  Mammogram.** / Every year beginning at age 40 years and continuing for as long as you are in good health. Consult with your health care provider.  Pap test.** / Every 3 years starting at age 9 years through age 34 or 91 years with 3 consecutive normal Pap tests. Testing can be stopped between 65 and 70 years with 3 consecutive normal Pap tests and no abnormal Pap or HPV tests in the past 10 years.  HPV screening.** / Every 3 years from ages 57 years through ages 64 or 45 years with a history of 3 consecutive normal Pap tests. Testing can be stopped between 65 and 70 years with 3 consecutive normal Pap tests and no abnormal Pap or HPV tests in the past 10 years.  Fecal occult blood test (FOBT) of stool. / Every year beginning at age 15 years and continuing until age 17 years. You may not need to do this test if you get a colonoscopy every 10 years.  Flexible sigmoidoscopy or colonoscopy.** / Every 5 years for a flexible sigmoidoscopy or every 10 years for a colonoscopy beginning at age 86 years and continuing until age 71 years.  Hepatitis C blood test.** / For all people born from [birth year] through 1967 and any individual with known risks for hepatitis C.  Osteoporosis screening.** / A one-time screening for women ages 83 years and over and women at risk for fractures or osteoporosis.  Skin self-exam. / Monthly.  Influenza vaccine. / Every year.  Tetanus, diphtheria, and acellular pertussis (Tdap/Td) vaccine.** / 1 dose of Td every 10 years.  Varicella vaccine.** / Consult your health care provider.  Zoster vaccine.** / 1 dose for adults aged 61 years or older.  Pneumococcal 13-valent conjugate (PCV13) vaccine.** / Consult your health care provider.  Pneumococcal  polysaccharide (PPSV23) vaccine.** / 1 dose for all adults aged 28 years and older.  Meningococcal vaccine.** / Consult your health care provider.  Hepatitis A vaccine.** / Consult your health care provider.  Hepatitis B vaccine.** / Consult your health care provider.  Haemophilus influenzae type b (Hib) vaccine.** / Consult your health care provider. ** Family history and personal history of risk and conditions may change your health care provider's recommendations. Document Released: 11/23/2001 Document Revised: 02/11/2014 Document Reviewed: 02/22/2011 [facility] Patient Information 2015 [place], [place]. This information is not intended to replace advice given to you by your health care provider. Make sure you discuss any questions you have with your health care provider.

## 2014-06-14 NOTE — Progress Notes (Signed)
Patient has had paragard iud she thinks it has been about 10 years.  Does she have to get it removed, is there a problem with it staying in?  She has not had a period in over a year.

## 2014-06-18 LAB — CYTOLOGY - PAP

## 2014-08-12 ENCOUNTER — Encounter: Payer: Self-pay | Admitting: Obstetrics and Gynecology

## 2014-10-01 ENCOUNTER — Encounter: Payer: Self-pay | Admitting: Family Medicine

## 2014-10-01 ENCOUNTER — Ambulatory Visit (INDEPENDENT_AMBULATORY_CARE_PROVIDER_SITE_OTHER): Payer: Federal, State, Local not specified - PPO | Admitting: Family Medicine

## 2014-10-01 VITALS — BP 146/90 | HR 83 | Temp 98.8°F | Ht 66.0 in | Wt 174.8 lb

## 2014-10-01 DIAGNOSIS — J019 Acute sinusitis, unspecified: Secondary | ICD-10-CM | POA: Insufficient documentation

## 2014-10-01 DIAGNOSIS — J0110 Acute frontal sinusitis, unspecified: Secondary | ICD-10-CM

## 2014-10-01 MED ORDER — AMOXICILLIN-POT CLAVULANATE 875-125 MG PO TABS: 1.0000 | ORAL_TABLET | Freq: Two times a day (BID) | ORAL | Status: DC

## 2014-10-01 NOTE — Progress Notes (Signed)
Pre visit review using our clinic review tool, if applicable. No additional management support is needed unless otherwise documented below in the visit note. 

## 2014-10-01 NOTE — Patient Instructions (Signed)
Take augmentin for sinus infection  Drink lots of water  Breathe steam / use warm compresses on face  mucinex if fine  Ibuprofen over the counter may help with sinus swelling and pain

## 2014-10-01 NOTE — Progress Notes (Signed)
Subjective:    Patient ID: [name], female    DOB: 1960-12-11, 53 y.o.   MRN: [id number]  HPI Here for uri symptoms   Has headache and facial pain with lot of congestion  Drainage causes her to cough  Clearing throat  Cough is dry  Throat hurts to cough  Perhaps a low grade temp initially-not now   otc - has tried mucinex/nyquil / alka selzer plus   bp is up a bit today-poss from med   Patient Active Problem List   Diagnosis Date Noted  . Rash and nonspecific skin eruption 05/29/2014  . Anxiety disorder 10/17/2013  . Routine general medical examination at a health care facility 10/01/2012  . BACK PAIN 08/18/2010  . ESSENTIAL HYPERTENSION, BENIGN 01/29/2010  . HYPERLIPIDEMIA 10/09/2007  . OSTEOARTHRITIS, KNEE 10/09/2007   Past Medical History  Diagnosis Date  . Hyperlipidemia   . Hypertension     with some white coat component  . Fibroids     uterine  . Abnormal finding on Pap smear 2006    endometrial cells, biopsy ok  . History of pelvic ultrasound 10/2007    uterine fibroids  . Herpes zoster     mild case  . Anxiety    Past Surgical History  Procedure Laterality Date  . Knee surgery  2002  . Iud removal  2006  . Wisdom tooth extraction    . Cryoablation  1982    abnormal pap   History  Substance Use Topics  . Smoking status: Never Smoker   . Smokeless tobacco: Never Used  . Alcohol Use: No   Family History  Problem Relation Age of Onset  . Glaucoma Father   . Heart disease Mother   . Heart attack Mother     stent  . Hyperlipidemia Mother   . Colon cancer Mother   . Hypertension Mother   . Cancer Mother     colon cancer  . Stroke Mother   . Goiter Daughter    No Known Allergies Current Outpatient Prescriptions on File Prior to Visit  Medication Sig Dispense Refill  . hydrochlorothiazide (MICROZIDE) 12.5 MG capsule TAKE 1 CAPSULE BY MOUTH EVERY MORNING 90 capsule 3  . IUD's (PARAGARD INTRAUTERINE COPPER) IUD IUD 1 each by Intrauterine  route once.    . mometasone (ELOCON) 0.1 % cream Apply 1 application topically daily. 30 g 1  . Multiple Vitamin (MULTIVITAMIN) capsule Take 1 capsule by mouth daily.       No current facility-administered medications on file prior to visit.      Review of Systems Review of Systems  Constitutional: Negative for fever, appetite change, and unexpected weight change. pos for malaise ENt pos for cong and rhinorrhea and facial pain  Eyes: Negative for pain and visual disturbance.  Respiratory: Negative for wheeze  and shortness of breath.   Cardiovascular: Negative for cp or palpitations    Gastrointestinal: Negative for nausea, diarrhea and constipation.  Genitourinary: Negative for urgency and frequency.  Skin: Negative for pallor or rash   Neurological: Negative for weakness, light-headedness, numbness and headaches.  Hematological: Negative for adenopathy. Does not bruise/bleed easily.  Psychiatric/Behavioral: Negative for dysphoric mood. The patient is not nervous/anxious.         Objective:   Physical Exam  Constitutional: She appears well-developed and well-nourished. No distress.  HENT:  Head: Normocephalic and atraumatic.  Right Ear: External ear normal.  Mouth/Throat: Oropharynx is clear and moist. No oropharyngeal exudate.  Nares are  injected and congested  Yellow rhinorrhea Bilateral ethmoid and frontal sinus tenderness  Eyes: Conjunctivae and EOM are normal. Pupils are equal, round, and reactive to light. Right eye exhibits no discharge. Left eye exhibits no discharge.  Neck: Normal range of motion. Neck supple.  Cardiovascular: Normal rate and regular rhythm.   Pulmonary/Chest: Effort normal and breath sounds normal. No respiratory distress. She has no wheezes. She has no rales.  Neurological: She is alert. No cranial nerve deficit.  Skin: Skin is warm and dry. No rash noted.  Psychiatric: She has a normal mood and affect.          Assessment & Plan:    Problem List Items Addressed This Visit      Respiratory   Acute sinusitis - Primary    With uri symptoms for a week  Disc symptomatic care - see instructions on AVS - mucinex/nasal saline/ fluids/ steam  Cover with augmentin  Update if not starting to improve in a week or if worsening       Relevant Medications      amoxicillin-clavulanate (AUGMENTIN) tablet 875-125 mg

## 2014-10-01 NOTE — Assessment & Plan Note (Signed)
With uri symptoms for a week  Disc symptomatic care - see instructions on AVS - mucinex/nasal saline/ fluids/ steam  Cover with augmentin  Update if not starting to improve in a week or if worsening

## 2014-10-22 ENCOUNTER — Other Ambulatory Visit: Payer: Self-pay

## 2014-10-22 DIAGNOSIS — Z1231 Encounter for screening mammogram for malignant neoplasm of breast: Secondary | ICD-10-CM

## 2014-10-24 ENCOUNTER — Other Ambulatory Visit: Payer: Self-pay | Admitting: Family Medicine

## 2014-10-29 ENCOUNTER — Ambulatory Visit
Admission: RE | Admit: 2014-10-29 | Discharge: 2014-10-29 | Disposition: A | Discharge status: Home / Self Care | Payer: Federal, State, Local not specified - PPO | Source: Ambulatory Visit

## 2014-10-29 DIAGNOSIS — Z1231 Encounter for screening mammogram for malignant neoplasm of breast: Secondary | ICD-10-CM

## 2014-10-30 LAB — HM MAMMOGRAPHY: HM Mammogram: NORMAL

## 2014-10-31 ENCOUNTER — Encounter: Payer: Self-pay | Admitting: *Deleted

## 2015-01-31 ENCOUNTER — Ambulatory Visit (INDEPENDENT_AMBULATORY_CARE_PROVIDER_SITE_OTHER): Payer: Federal, State, Local not specified - PPO | Admitting: Family Medicine

## 2015-01-31 ENCOUNTER — Encounter: Payer: Self-pay | Admitting: Family Medicine

## 2015-01-31 VITALS — BP 140/88 | HR 81 | Temp 98.1°F | Ht 66.0 in | Wt 182.5 lb

## 2015-01-31 DIAGNOSIS — Z7189 Other specified counseling: Secondary | ICD-10-CM

## 2015-01-31 DIAGNOSIS — Z7184 Encounter for health counseling related to travel: Secondary | ICD-10-CM | POA: Insufficient documentation

## 2015-01-31 MED ORDER — ATOVAQUONE-PROGUANIL HCL 250-100 MG PO TABS: 1.0000 | ORAL_TABLET | Freq: Every day | ORAL | Status: DC

## 2015-01-31 NOTE — Assessment & Plan Note (Signed)
Counseled on travel upcoming to [place]  Will get Hep A , typhoid, yellow fever vaccines at the health dept Px for malarone given for malaria proph-disc inst and also poss side eff Disc use of insect repellent and care with food choices   She will f/u with the health dept for vaccines

## 2015-01-31 NOTE — Patient Instructions (Signed)
I think for travel to [place] - you will need Hepatitis A vaccine , typhoid, and Yellow fever  Make an appt at the health dept for those   For malaria prophylaxis here is a px for malarone   If any problems let me know   Have a good trip   Use mosquito repellent = preferably with DEET

## 2015-01-31 NOTE — Progress Notes (Signed)
Subjective:    Patient ID: [name], female    DOB: 1961-09-09, 54 y.o.   MRN: [id number]  HPI Here to discuss upcoming trip  Is leaving May 6  Will be going to [place]- [place]  Just to visit - with her daughter    imms recommended  Hep A   Will need Typhoid and Yellow fever   Needs anti malaria tx   malarone -needs px   Patient Active Problem List   Diagnosis Date Noted  . Travel advice encounter 01/31/2015  . Acute sinusitis 10/01/2014  . Rash and nonspecific skin eruption 05/29/2014  . Anxiety disorder 10/17/2013  . Routine general medical examination at a health care facility 10/01/2012  . BACK PAIN 08/18/2010  . ESSENTIAL HYPERTENSION, BENIGN 01/29/2010  . HYPERLIPIDEMIA 10/09/2007  . OSTEOARTHRITIS, KNEE 10/09/2007   Past Medical History  Diagnosis Date  . Hyperlipidemia   . Hypertension     with some white coat component  . Fibroids     uterine  . Abnormal finding on Pap smear 2006    endometrial cells, biopsy ok  . History of pelvic ultrasound 10/2007    uterine fibroids  . Herpes zoster     mild case  . Anxiety    Past Surgical History  Procedure Laterality Date  . Knee surgery  2002  . Iud removal  2006  . Wisdom tooth extraction    . Cryoablation  1982    abnormal pap   History  Substance Use Topics  . Smoking status: Never Smoker   . Smokeless tobacco: Never Used  . Alcohol Use: No   Family History  Problem Relation Age of Onset  . Glaucoma Father   . Heart disease Mother   . Heart attack Mother     stent  . Hyperlipidemia Mother   . Colon cancer Mother   . Hypertension Mother   . Cancer Mother     colon cancer  . Stroke Mother   . Goiter Daughter    No Known Allergies Current Outpatient Prescriptions on File Prior to Visit  Medication Sig Dispense Refill  . hydrochlorothiazide (MICROZIDE) 12.5 MG capsule TAKE 1 CAPSULE BY MOUTH EVERY MORNING 90 capsule 1  . IUD's (PARAGARD INTRAUTERINE COPPER) IUD IUD 1 each by  Intrauterine route once.    . Multiple Vitamin (MULTIVITAMIN) capsule Take 1 capsule by mouth daily.       No current facility-administered medications on file prior to visit.     Review of Systems    Review of Systems  Constitutional: Negative for fever, appetite change, fatigue and unexpected weight change.  Eyes: Negative for pain and visual disturbance.  Respiratory: Negative for cough and shortness of breath.   Cardiovascular: Negative for cp or palpitations    Gastrointestinal: Negative for nausea, diarrhea and constipation.  Genitourinary: Negative for urgency and frequency.  Skin: Negative for pallor or rash   Neurological: Negative for weakness, light-headedness, numbness and headaches.  Hematological: Negative for adenopathy. Does not bruise/bleed easily.  Psychiatric/Behavioral: Negative for dysphoric mood. The patient is not nervous/anxious.      Objective:   Physical Exam  Constitutional: She appears well-developed and well-nourished. No distress.  HENT:  Head: Normocephalic and atraumatic.  Skin: Skin is warm and dry.  Psychiatric: She has a normal mood and affect.          Assessment & Plan:   Problem List Items Addressed This Visit      Other   Travel advice  encounter - Primary    Counseled on travel upcoming to [place]  Will get Hep A , typhoid, yellow fever vaccines at the health dept Px for malarone given for malaria proph-disc inst and also poss side eff Disc use of insect repellent and care with food choices   She will f/u with the health dept for vaccines

## 2015-01-31 NOTE — Progress Notes (Signed)
Pre visit review using our clinic review tool, if applicable. No additional management support is needed unless otherwise documented below in the visit note. 

## 2015-05-14 ENCOUNTER — Encounter: Payer: Self-pay | Admitting: Family Medicine

## 2015-05-14 ENCOUNTER — Ambulatory Visit (INDEPENDENT_AMBULATORY_CARE_PROVIDER_SITE_OTHER): Payer: Federal, State, Local not specified - PPO | Admitting: Family Medicine

## 2015-05-14 VITALS — BP 144/88 | HR 74 | Temp 98.3°F | Ht 64.5 in | Wt 179.0 lb

## 2015-05-14 DIAGNOSIS — I10 Essential (primary) hypertension: Secondary | ICD-10-CM

## 2015-05-14 DIAGNOSIS — Z800 Family history of malignant neoplasm of digestive organs: Secondary | ICD-10-CM

## 2015-05-14 DIAGNOSIS — F341 Dysthymic disorder: Secondary | ICD-10-CM

## 2015-05-14 DIAGNOSIS — Z0000 Encounter for general adult medical examination without abnormal findings: Secondary | ICD-10-CM | POA: Diagnosis not present

## 2015-05-14 DIAGNOSIS — E785 Hyperlipidemia, unspecified: Secondary | ICD-10-CM | POA: Diagnosis not present

## 2015-05-14 MED ORDER — BUSPIRONE HCL 15 MG PO TABS: 7.5000 mg | ORAL_TABLET | Freq: Two times a day (BID) | ORAL | Status: DC

## 2015-05-14 MED ORDER — HYDROCHLOROTHIAZIDE 12.5 MG PO CAPS: 12.5000 mg | ORAL_CAPSULE | Freq: Every morning | ORAL | Status: DC

## 2015-05-14 NOTE — Progress Notes (Signed)
Subjective:    Patient ID: [name], female    DOB: 10-08-1961, 54 y.o.   MRN: [id number]  HPI Here for health maintenance exam and to review chronic medical problems    Working through the summer  Took some time off in May Feeling ok in general   Is a bit more anxious emotionally lately  Fuzzy head and hard to concentrate  Looses interest in things Lost her mother about 2 y ago  The girls are gone out of the house  A big adjustment  Has not seen a counselor   Tries to make herself exercise and get out  Has some good friends that she talks to   Does not do a lot for herself  Has a lot of worries - example - ?enough $ to retire/her health etc   Interests - goes biking with a group on Saturdays   Of note- her psychiatrist put her on zoloft in the past - she had a reaction - felt more anxious and chest was tight    Wt is down 3 lb with bmi of 30 Has been trying - biking and working out  Really working on diet  In obese range  Had knee scoped in may for meniscal injury - better now and now biking and swimming    HIV/Hep C screen- does not think she is at risk  Was tested when pregnant   Flu vaccine- got one last season  Mammogram nl 1/16 Self exam - no lumps on self exam   Pap nl 9/15 with Cr Constant She has a Paraguard IUD- was wondering about getting that out  No vaginal bleeding     colonosc 6/10 nl  fam hx - mother with colon cancer  Needs a 5 year f/u   Td 12/13  bp is up on first check today  No cp or palpitations or headaches or edema  No side effects to medicines  BP Readings from Last 3 Encounters:  05/14/15 144/88  01/31/15 140/88  10/01/14 146/90    Has checked it outside the office -is usually lower - gets nervous in the office At home 120s/80s  On microzide 12.5 mg   Hyperlipidemia Lab Results  Component Value Date   CHOL 232* 10/17/2013   CHOL 204* 10/02/2012   CHOL 215* 02/11/2011   Lab Results  Component Value Date   HDL  66.30 10/17/2013   HDL 56.30 10/02/2012   HDL 65.30 02/11/2011   Lab Results  Component Value Date   LDLCALC 112* 02/11/2010   LDLCALC 112* 08/20/2008   LDLCALC 111* 11/09/2007   Lab Results  Component Value Date   TRIG 122.0 10/17/2013   TRIG 122.0 10/02/2012   TRIG 118.0 02/11/2011   Lab Results  Component Value Date   CHOLHDL 3 10/17/2013   CHOLHDL 4 10/02/2012   CHOLHDL 3 02/11/2011   Lab Results  Component Value Date   LDLDIRECT 128.6 10/17/2013   LDLDIRECT 128.8 10/02/2012   LDLDIRECT 129.8 02/11/2011   looking good - LDL down and HDL up  More veggies and biking     Chemistry      Component Value Date/Time   NA 138 10/17/2013 1241   K 3.9 10/17/2013 1241   CL 101 10/17/2013 1241   CO2 30 10/17/2013 1241   BUN 10 10/17/2013 1241   CREATININE 1.1 10/17/2013 1241      Component Value Date/Time   CALCIUM 9.8 10/17/2013 1241   ALKPHOS 80 10/17/2013 1241  AST 26 10/17/2013 1241   ALT 12 10/17/2013 1241   BILITOT 1.1 10/17/2013 1241      Lab Results  Component Value Date   WBC 7.3 10/17/2013   HGB 13.7 10/17/2013   HCT 40.5 10/17/2013   MCV 96.2 10/17/2013   PLT 248.0 10/17/2013    Lab Results  Component Value Date   TSH 0.77 10/17/2013     Patient Active Problem List   Diagnosis Date Noted  . Travel advice encounter 01/31/2015  . Anxiety disorder 10/17/2013  . Routine general medical examination at a health care facility 10/01/2012  . BACK PAIN 08/18/2010  . ESSENTIAL HYPERTENSION, BENIGN 01/29/2010  . Hyperlipidemia 10/09/2007  . OSTEOARTHRITIS, KNEE 10/09/2007   Past Medical History  Diagnosis Date  . Hyperlipidemia   . Hypertension     with some white coat component  . Fibroids     uterine  . Abnormal finding on Pap smear 2006    endometrial cells, biopsy ok  . History of pelvic ultrasound 10/2007    uterine fibroids  . Herpes zoster     mild case  . Anxiety    Past Surgical History  Procedure Laterality Date  . Knee surgery   2002  . Iud removal  2006  . Wisdom tooth extraction    . Cryoablation  1982    abnormal pap   History  Substance Use Topics  . Smoking status: Never Smoker   . Smokeless tobacco: Never Used  . Alcohol Use: No   Family History  Problem Relation Age of Onset  . Glaucoma Father   . Heart disease Mother   . Heart attack Mother     stent  . Hyperlipidemia Mother   . Colon cancer Mother   . Hypertension Mother   . Cancer Mother     colon cancer  . Stroke Mother   . Goiter Daughter    No Known Allergies Current Outpatient Prescriptions on File Prior to Visit  Medication Sig Dispense Refill  . atovaquone-proguanil (MALARONE) 250-100 MG TABS Take 1 tablet by mouth daily. Start 2 days before travel and continue for 1 week after travel 30 tablet 0  . hydrochlorothiazide (MICROZIDE) 12.5 MG capsule TAKE 1 CAPSULE BY MOUTH EVERY MORNING 90 capsule 1  . IUD's (PARAGARD INTRAUTERINE COPPER) IUD IUD 1 each by Intrauterine route once.    . Multiple Vitamin (MULTIVITAMIN) capsule Take 1 capsule by mouth daily.       No current facility-administered medications on file prior to visit.     Review of Systems Review of Systems  Constitutional: Negative for fever, appetite change, fatigue and unexpected weight change.  Eyes: Negative for pain and visual disturbance.  Respiratory: Negative for cough and shortness of breath.   Cardiovascular: Negative for cp or palpitations    Gastrointestinal: Negative for nausea, diarrhea and constipation.  Genitourinary: Negative for urgency and frequency.  Skin: Negative for pallor or rash   Neurological: Negative for weakness, light-headedness, numbness and headaches.  Hematological: Negative for adenopathy. Does not bruise/bleed easily.  Psychiatric/Behavioral: pos for dysphoric and anxious mood neg for SI        Objective:   Physical Exam  Constitutional: She appears well-developed and well-nourished. No distress.  overwt and well appearing     HENT:  Head: Normocephalic and atraumatic.  Right Ear: External ear normal.  Left Ear: External ear normal.  Nose: Nose normal.  Mouth/Throat: Oropharynx is clear and moist.  Eyes: Conjunctivae and EOM are normal. Pupils  are equal, round, and reactive to light. Right eye exhibits no discharge. Left eye exhibits no discharge. No scleral icterus.  Neck: Normal range of motion. Neck supple. No JVD present. Carotid bruit is not present. No thyromegaly present.  Cardiovascular: Normal rate, regular rhythm, normal heart sounds and intact distal pulses.  Exam reveals no gallop.   Pulmonary/Chest: Effort normal and breath sounds normal. No respiratory distress. She has no wheezes. She has no rales.  Abdominal: Soft. Bowel sounds are normal. She exhibits no distension and no mass. There is no tenderness.  Musculoskeletal: She exhibits no edema or tenderness.  Lymphadenopathy:    She has no cervical adenopathy.  Neurological: She is alert. She has normal reflexes. No cranial nerve deficit. She exhibits normal muscle tone. Coordination normal.  Skin: Skin is warm and dry. No rash noted. No erythema. No pallor.  Psychiatric: Her speech is normal and behavior is normal. Thought content normal. Her mood appears not anxious. Her affect is blunt. Her affect is not labile. Thought content is not paranoid. She does not exhibit a depressed mood. She expresses no homicidal and no suicidal ideation.  Pleasant  Talks easily about stressors           Assessment & Plan:   Problem List Items Addressed This Visit    Dysthymia    Reviewed stressors/ coping techniques/symptoms/ support sources/ tx options and side effects in detail today  Will try buspar 7.5 bid and see if she tolerates better than zoloft  Strongly recommend CBT as well  Rev lifestyle habits/ exercise/ eating        Relevant Medications   busPIRone (BUSPAR) 15 MG tablet   ESSENTIAL HYPERTENSION, BENIGN    bp is up (pt claims lower at  home)  Will keep a log and f/u with her cuff  If necessary will inc her hctz  Rev diet/ DASH eating plan and exercise      Relevant Medications   hydrochlorothiazide (MICROZIDE) 12.5 MG capsule   Family history of colon cancer    Due for 5 year recall Referred for that       Relevant Orders   Ambulatory referral to Gastroenterology   Hyperlipidemia    Disc goals for lipids and reasons to control them Rev labs with pt Rev low sat fat diet in detail HDL is up favorably       Relevant Medications   hydrochlorothiazide (MICROZIDE) 12.5 MG capsule   Routine general medical examination at a health care facility - Primary    Reviewed health habits including diet and exercise and skin cancer prevention Reviewed appropriate screening tests for age  Also reviewed health mt list, fam hx and immunization status , as well as social and family history   See HPI Labs reviewed  Will return for bp check  Ref for recall colonoscopy for family hx

## 2015-05-14 NOTE — Progress Notes (Signed)
Pre visit review using our clinic review tool, if applicable. No additional management support is needed unless otherwise documented below in the visit note. 

## 2015-05-14 NOTE — Patient Instructions (Signed)
For anxiety and depression symptoms (dysthymia) - here is a px for Buspar - 1/2 pill twice daily - if it makes you worse or if any intolerable side effects stop it and let me know  If you want to see a counselor in the future please let us know  BP is a little too high - watch diet for sodium/ keep working out and bring your BP cuff to the next visit  Stop at check out for referral to GI for follow up colonoscopy   Follow up with me in 4-6 weeks

## 2015-05-15 NOTE — Assessment & Plan Note (Signed)
Reviewed health habits including diet and exercise and skin cancer prevention Reviewed appropriate screening tests for age  Also reviewed health mt list, fam hx and immunization status , as well as social and family history   See HPI Labs reviewed  Will return for bp check  Ref for recall colonoscopy for family hx

## 2015-05-15 NOTE — Assessment & Plan Note (Signed)
bp is up (pt claims lower at home)  Will keep a log and f/u with her cuff  If necessary will inc her hctz  Rev diet/ DASH eating plan and exercise

## 2015-05-15 NOTE — Assessment & Plan Note (Signed)
Due for 5 year recall Referred for that

## 2015-05-15 NOTE — Assessment & Plan Note (Signed)
Reviewed stressors/ coping techniques/symptoms/ support sources/ tx options and side effects in detail today  Will try buspar 7.5 bid and see if she tolerates better than zoloft  Strongly recommend CBT as well  Rev lifestyle habits/ exercise/ eating

## 2015-05-15 NOTE — Assessment & Plan Note (Signed)
Disc goals for lipids and reasons to control them Rev labs with pt Rev low sat fat diet in detail HDL is up favorably

## 2015-05-21 ENCOUNTER — Encounter: Payer: Self-pay | Admitting: Gastroenterology

## 2015-06-13 ENCOUNTER — Ambulatory Visit (INDEPENDENT_AMBULATORY_CARE_PROVIDER_SITE_OTHER): Payer: Federal, State, Local not specified - PPO | Admitting: Family Medicine

## 2015-06-13 ENCOUNTER — Encounter: Payer: Self-pay | Admitting: Family Medicine

## 2015-06-13 VITALS — BP 125/80 | HR 74 | Temp 98.3°F | Wt 178.0 lb

## 2015-06-13 DIAGNOSIS — F341 Dysthymic disorder: Secondary | ICD-10-CM | POA: Diagnosis not present

## 2015-06-13 DIAGNOSIS — I10 Essential (primary) hypertension: Secondary | ICD-10-CM

## 2015-06-13 LAB — CBC WITH DIFFERENTIAL/PLATELET
Basophils Absolute: 0 10*3/uL (ref 0.0–0.1)
Basophils Relative: 0.2 % (ref 0.0–3.0)
Eosinophils Absolute: 0.1 10*3/uL (ref 0.0–0.7)
Eosinophils Relative: 2.1 % (ref 0.0–5.0)
HCT: 41 % (ref 36.0–46.0)
Hemoglobin: 13.7 g/dL (ref 12.0–15.0)
Lymphocytes Relative: 30.8 % (ref 12.0–46.0)
Lymphs Abs: 1.8 10*3/uL (ref 0.7–4.0)
MCHC: 33.4 g/dL (ref 30.0–36.0)
MCV: 96.9 fl (ref 78.0–100.0)
Monocytes Absolute: 0.4 10*3/uL (ref 0.1–1.0)
Monocytes Relative: 7.2 % (ref 3.0–12.0)
Neutro Abs: 3.6 10*3/uL (ref 1.4–7.7)
Neutrophils Relative %: 59.7 % (ref 43.0–77.0)
Platelets: 255 10*3/uL (ref 150.0–400.0)
RBC: 4.23 Mil/uL (ref 3.87–5.11)
RDW: 12.7 % (ref 11.5–15.5)
WBC: 6 10*3/uL (ref 4.0–10.5)

## 2015-06-13 LAB — COMPREHENSIVE METABOLIC PANEL
ALT: 7 U/L (ref 0–35)
AST: 18 U/L (ref 0–37)
Albumin: 3.9 g/dL (ref 3.5–5.2)
Alkaline Phosphatase: 92 U/L (ref 39–117)
BUN: 12 mg/dL (ref 6–23)
CO2: 32 mEq/L (ref 19–32)
Calcium: 9.7 mg/dL (ref 8.4–10.5)
Chloride: 103 mEq/L (ref 96–112)
Creatinine, Ser: 0.99 mg/dL (ref 0.40–1.20)
GFR: 74.44 mL/min (ref >60.00)
Glucose, Bld: 89 mg/dL (ref 70–99)
Potassium: 3.8 mEq/L (ref 3.5–5.1)
Sodium: 141 mEq/L (ref 135–145)
Total Bilirubin: 0.7 mg/dL (ref 0.2–1.2)
Total Protein: 7.7 g/dL (ref 6.0–8.3)

## 2015-06-13 LAB — LIPID PANEL
Cholesterol: 192 mg/dL (ref 0–200)
HDL: 56.8 mg/dL (ref >39.00)
LDL Cholesterol: 113 mg/dL — ABNORMAL HIGH (ref 0–99)
NonHDL: 135.09
Total CHOL/HDL Ratio: 3
Triglycerides: 112 mg/dL (ref 0.0–149.0)
VLDL: 22.4 mg/dL (ref 0.0–40.0)

## 2015-06-13 LAB — TSH: TSH: 2.03 u[IU]/mL (ref 0.35–4.50)

## 2015-06-13 MED ORDER — BUSPIRONE HCL 15 MG PO TABS: 15.0000 mg | ORAL_TABLET | Freq: Two times a day (BID) | ORAL | Status: DC

## 2015-06-13 NOTE — Progress Notes (Signed)
Subjective:    Patient ID: [name], female    DOB: [birth year], 54 y.o.   MRN: [id number]  HPI Here for f/u of mood and HTN   Last visit started buspar 7.5 mg bid  She thinks it helps a little  No side effects  Interested in trying to increase to 15 mg twice daily  No SI at all  Is able to laugh at things Still gets nervous at times   bp is stable today  No cp or palpitations or headaches or edema  No side effects to medicines  BP Readings from Last 3 Encounters:  06/13/15 118/78  05/14/15 144/88  01/31/15 140/88     Her cuff 672 systolic   Re check here 094/70 R arm    Patient Active Problem List   Diagnosis Date Noted  . Family history of colon cancer 05/14/2015  . Travel advice encounter 01/31/2015  . Dysthymia 10/17/2013  . Routine general medical examination at a health care facility 10/01/2012  . BACK PAIN 08/18/2010  . ESSENTIAL HYPERTENSION, BENIGN 01/29/2010  . Hyperlipidemia 10/09/2007  . OSTEOARTHRITIS, KNEE 10/09/2007   Past Medical History  Diagnosis Date  . Hyperlipidemia   . Hypertension     with some white coat component  . Fibroids     uterine  . Abnormal finding on Pap smear 2006    endometrial cells, biopsy ok  . History of pelvic ultrasound 10/2007    uterine fibroids  . Herpes zoster     mild case  . Anxiety    Past Surgical History  Procedure Laterality Date  . Knee surgery  2002  . Iud removal  2006  . Wisdom tooth extraction    . Cryoablation  1982    abnormal pap   Social History  Substance Use Topics  . Smoking status: Never Smoker   . Smokeless tobacco: Never Used  . Alcohol Use: No   Family History  Problem Relation Age of Onset  . Glaucoma Father   . Heart disease Mother   . Heart attack Mother     stent  . Hyperlipidemia Mother   . Colon cancer Mother   . Hypertension Mother   . Cancer Mother     colon cancer  . Stroke Mother   . Goiter Daughter    No Known Allergies Current Outpatient Prescriptions  on File Prior to Visit  Medication Sig Dispense Refill  . atovaquone-proguanil (MALARONE) 250-100 MG TABS Take 1 tablet by mouth daily. Start 2 days before travel and continue for 1 week after travel 30 tablet 0  . busPIRone (BUSPAR) 15 MG tablet Take 0.5 tablets (7.5 mg total) by mouth 2 (two) times daily. 30 tablet 11  . hydrochlorothiazide (MICROZIDE) 12.5 MG capsule Take 1 capsule (12.5 mg total) by mouth every morning. 90 capsule 3  . IUD's (PARAGARD INTRAUTERINE COPPER) IUD IUD 1 each by Intrauterine route once.    . Multiple Vitamin (MULTIVITAMIN) capsule Take 1 capsule by mouth daily.       No current facility-administered medications on file prior to visit.     Review of Systems Review of Systems  Constitutional: Negative for fever, appetite change, fatigue and unexpected weight change.  Eyes: Negative for pain and visual disturbance.  Respiratory: Negative for cough and shortness of breath.   Cardiovascular: Negative for cp or palpitations    Gastrointestinal: Negative for nausea, diarrhea and constipation.  Genitourinary: Negative for urgency and frequency.  Skin: Negative for pallor or rash  Neurological: Negative for weakness, light-headedness, numbness and headaches.  Hematological: Negative for adenopathy. Does not bruise/bleed easily.  Psychiatric/Behavioral: Negative for dysphoric mood. The patient is mildly  nervous/anxious.         Objective:   Physical Exam  Constitutional: She appears well-developed and well-nourished. No distress.  obese and well appearing   HENT:  Head: Normocephalic and atraumatic.  Mouth/Throat: Oropharynx is clear and moist.  Eyes: Conjunctivae and EOM are normal. Pupils are equal, round, and reactive to light.  Neck: Normal range of motion. Neck supple. No JVD present. Carotid bruit is not present. No thyromegaly present.  Cardiovascular: Normal rate, regular rhythm, normal heart sounds and intact distal pulses.  Exam reveals no gallop.    Pulmonary/Chest: Effort normal and breath sounds normal. No respiratory distress. She has no wheezes. She has no rales.  No crackles  Abdominal: Soft. Bowel sounds are normal. She exhibits no distension, no abdominal bruit and no mass. There is no tenderness.  Musculoskeletal: She exhibits no edema.  Lymphadenopathy:    She has no cervical adenopathy.  Neurological: She is alert. She has normal reflexes.  Skin: Skin is warm and dry. No rash noted.  Psychiatric: She has a normal mood and affect. Her speech is normal and behavior is normal. Thought content normal. Thought content is not paranoid. She expresses no homicidal and no suicidal ideation.  Improved affect Pleasant and talkative           Assessment & Plan:   Problem List Items Addressed This Visit      Cardiovascular and Mediastinum   ESSENTIAL HYPERTENSION, BENIGN - Primary    Better control here - her cuff is not accurate Re assured bp in fair control at this time  BP Readings from Last 1 Encounters:  06/13/15 125/80   No changes needed Disc lifstyle change with low sodium diet and exercise         Relevant Orders   CBC with Differential/Platelet (Completed)   Comprehensive metabolic panel (Completed)   TSH (Completed)   Lipid panel (Completed)     Other   Dysthymia    Improving with buspar 7.5 bid Will increase to 15 mg bid  Discussed expectations of SSRI medication including time to effectiveness and mechanism of action, also poss of side effects (early and late)- including mental fuzziness, weight or appetite change, nausea and poss of worse dep or anxiety (even suicidal thoughts)  Pt voiced understanding and will stop med and update if this occurs   Update if worse or no improvement  Counseling offered       Relevant Medications   busPIRone (BUSPAR) 15 MG tablet

## 2015-06-13 NOTE — Patient Instructions (Signed)
Go ahead and increase your buspar to a whole pill twice daily  Your BP cuff is running high - bp here is good  Take care of yourself  Labs today   Get your flu shot this fall

## 2015-06-15 NOTE — Assessment & Plan Note (Signed)
Better control here - her cuff is not accurate Re assured bp in fair control at this time  BP Readings from Last 1 Encounters:  06/13/15 125/80   No changes needed Disc lifstyle change with low sodium diet and exercise

## 2015-06-15 NOTE — Assessment & Plan Note (Signed)
Improving with buspar 7.5 bid Will increase to 15 mg bid  Discussed expectations of SSRI medication including time to effectiveness and mechanism of action, also poss of side effects (early and late)- including mental fuzziness, weight or appetite change, nausea and poss of worse dep or anxiety (even suicidal thoughts)  Pt voiced understanding and will stop med and update if this occurs   Update if worse or no improvement  Counseling offered

## 2015-06-17 ENCOUNTER — Encounter: Payer: Self-pay | Admitting: *Deleted

## 2015-07-07 ENCOUNTER — Ambulatory Visit (INDEPENDENT_AMBULATORY_CARE_PROVIDER_SITE_OTHER): Payer: Federal, State, Local not specified - PPO | Admitting: Family Medicine

## 2015-07-07 ENCOUNTER — Encounter: Payer: Self-pay | Admitting: Family Medicine

## 2015-07-07 VITALS — BP 136/92 | HR 78 | Temp 98.0°F | Ht 64.5 in | Wt 183.0 lb

## 2015-07-07 DIAGNOSIS — T148 Other injury of unspecified body region: Secondary | ICD-10-CM | POA: Diagnosis not present

## 2015-07-07 DIAGNOSIS — W57XXXA Bitten or stung by nonvenomous insect and other nonvenomous arthropods, initial encounter: Secondary | ICD-10-CM | POA: Diagnosis not present

## 2015-07-07 MED ORDER — PREDNISONE 10 MG PO TABS: ORAL_TABLET | ORAL | Status: DC

## 2015-07-07 NOTE — Patient Instructions (Signed)
You have what looks like an allergic reaction to an insect bite Try a cool compress Use cortisone cream  Continue zytrec daily for itch Take prednisone (low dose) as directed   Update if not starting to improve in a week or if worsening

## 2015-07-07 NOTE — Progress Notes (Signed)
Pre visit review using our clinic review tool, if applicable. No additional management support is needed unless otherwise documented below in the visit note. 

## 2015-07-07 NOTE — Assessment & Plan Note (Signed)
With a large whelp type of reaction on L upper arm  No other hives and no signs of angioedema or anaphylaxis  Continue zyrtec Cool compresses  Prednisone 20 mg -with taper  Update if s/s of infection (reviewed)

## 2015-07-07 NOTE — Progress Notes (Signed)
Subjective:    Patient ID: [name], female    DOB: 05/21/1961, 54 y.o.   MRN: [id number]  HPI Here with mosquito bites   Happened Friday night = sitting outside  On her sides and arms (she was wearing a sport bra) mostly L arm (upper) rad to back  Felt something bite her  Took zyrtec   Used a px cortisone cream   Patient Active Problem List   Diagnosis Date Noted  . Family history of colon cancer 05/14/2015  . Travel advice encounter 01/31/2015  . Dysthymia 10/17/2013  . Routine general medical examination at a health care facility 10/01/2012  . BACK PAIN 08/18/2010  . ESSENTIAL HYPERTENSION, BENIGN 01/29/2010  . Hyperlipidemia 10/09/2007  . OSTEOARTHRITIS, KNEE 10/09/2007   Past Medical History  Diagnosis Date  . Hyperlipidemia   . Hypertension     with some white coat component  . Fibroids     uterine  . Abnormal finding on Pap smear 2006    endometrial cells, biopsy ok  . History of pelvic ultrasound 10/2007    uterine fibroids  . Herpes zoster     mild case  . Anxiety    Past Surgical History  Procedure Laterality Date  . Knee surgery  2002  . Iud removal  2006  . Wisdom tooth extraction    . Cryoablation  1982    abnormal pap   Social History  Substance Use Topics  . Smoking status: Never Smoker   . Smokeless tobacco: Never Used  . Alcohol Use: No   Family History  Problem Relation Age of Onset  . Glaucoma Father   . Heart disease Mother   . Heart attack Mother     stent  . Hyperlipidemia Mother   . Colon cancer Mother   . Hypertension Mother   . Cancer Mother     colon cancer  . Stroke Mother   . Goiter Daughter    No Known Allergies Current Outpatient Prescriptions on File Prior to Visit  Medication Sig Dispense Refill  . busPIRone (BUSPAR) 15 MG tablet Take 1 tablet (15 mg total) by mouth 2 (two) times daily. 60 tablet 11  . hydrochlorothiazide (MICROZIDE) 12.5 MG capsule Take 1 capsule (12.5 mg total) by mouth every morning. 90  capsule 3  . IUD's (PARAGARD INTRAUTERINE COPPER) IUD IUD 1 each by Intrauterine route once.    . Multiple Vitamin (MULTIVITAMIN) capsule Take 1 capsule by mouth daily.       No current facility-administered medications on file prior to visit.     Review of Systems Review of Systems  Constitutional: Negative for fever, appetite change, fatigue and unexpected weight change.  Eyes: Negative for pain and visual disturbance.  ENT neg for mouth or throat swelling  Respiratory: Negative for cough and shortness of breath.  neg for wheeze  Cardiovascular: Negative for cp or palpitations    Gastrointestinal: Negative for nausea, diarrhea and constipation.  Genitourinary: Negative for urgency and frequency.  Skin: Negative for pallor and pos for rash (whelp) with itching   Neurological: Negative for weakness, light-headedness, numbness and headaches.  Hematological: Negative for adenopathy. Does not bruise/bleed easily.  Psychiatric/Behavioral: Negative for dysphoric mood. The patient is not nervous/anxious.         Objective:   Physical Exam  Constitutional: She appears well-developed and well-nourished. No distress.  HENT:  Head: Normocephalic and atraumatic.  Eyes: Conjunctivae and EOM are normal. Pupils are equal, round, and reactive to light. Right  eye exhibits no discharge. Left eye exhibits no discharge. No scleral icterus.  Neck: Normal range of motion. Neck supple.  Cardiovascular: Normal rate and regular rhythm.   Pulmonary/Chest: Effort normal and breath sounds normal. No respiratory distress. She has no wheezes. She has no rales.  Musculoskeletal: She exhibits edema.  Lymphadenopathy:    She has no cervical adenopathy.  Neurological: She is alert.  Skin: Skin is warm and dry. There is erythema.  Large whelp over much of L upper arm with few excoriations  No discrete bite or scab seen  Warm to the touch nontender   Psychiatric: She has a normal mood and affect.           Assessment & Plan:   Problem List Items Addressed This Visit      Other   Insect bite - Primary    With a large whelp type of reaction on L upper arm  No other hives and no signs of angioedema or anaphylaxis  Continue zyrtec Cool compresses  Prednisone 20 mg -with taper  Update if s/s of infection (reviewed)

## 2015-08-04 ENCOUNTER — Ambulatory Visit (INDEPENDENT_AMBULATORY_CARE_PROVIDER_SITE_OTHER): Payer: Federal, State, Local not specified - PPO | Admitting: Gastroenterology

## 2015-08-04 ENCOUNTER — Encounter: Payer: Self-pay | Admitting: Gastroenterology

## 2015-08-04 VITALS — BP 152/92 | HR 88 | Ht 64.5 in | Wt 182.4 lb

## 2015-08-04 DIAGNOSIS — Z1211 Encounter for screening for malignant neoplasm of colon: Secondary | ICD-10-CM | POA: Diagnosis not present

## 2015-08-04 DIAGNOSIS — Z800 Family history of malignant neoplasm of digestive organs: Secondary | ICD-10-CM

## 2015-08-04 NOTE — Progress Notes (Signed)
[name]    [id number]    [birth year]  Primary Care Physician:Marne Tower, MD  Referring Physician: Abner Greenspan, MD [address] [place], [place] [address]  Chief complaint: family h/o colon cancer  HPI: 54 year old African-American female here for new patient visit to discuss timing of her next colonoscopy. She has a family history of colon cancer, her mother was diagnosed with colon cancer at age of 63. Her mother was not undergoing regular screening colonoscopies she presented with bleeding per rectum and was subsequently diagnosed with colon cancer. She underwent partial resection for colon, and passed away at age of 29. Patient had a colonoscopy in 2010, good prep and was normal. She denies any change in bowel habits, nausea, vomiting or abdominal pain. No melena or  blood per rectum. Her weight is stable.   Outpatient Encounter Prescriptions as of 08/04/2015  Medication Sig  . busPIRone (BUSPAR) 15 MG tablet Take 1 tablet (15 mg total) by mouth 2 (two) times daily.  . hydrochlorothiazide (MICROZIDE) 12.5 MG capsule Take 1 capsule (12.5 mg total) by mouth every morning.  . IUD's (PARAGARD INTRAUTERINE COPPER) IUD IUD 1 each by Intrauterine route once.  . Multiple Vitamin (MULTIVITAMIN) capsule Take 1 capsule by mouth daily.    . [DISCONTINUED] predniSONE (DELTASONE) 10 MG tablet Take 2 pills once daily for 3 days then 1 pill once daily for 3 days then stop   No facility-administered encounter medications on file as of 08/04/2015.    Allergies as of 08/04/2015  . (No Known Allergies)    Past Medical History  Diagnosis Date  . Hyperlipidemia   . Hypertension     with some white coat component  . Fibroids     uterine  . Abnormal finding on Pap smear 2006    endometrial cells, biopsy ok  . History of pelvic ultrasound 10/2007    uterine fibroids  . Herpes zoster     mild case  . Anxiety     Past Surgical History    Procedure Laterality Date  . Meniscus repair Right 2002, 03/07/2015  . Intrauterine device insertion  2006  . Wisdom tooth extraction    . Cryoablation  1982    abnormal pap    Family History  Problem Relation Age of Onset  . Glaucoma Father   . Heart disease Mother   . Heart attack Mother     stent  . Hyperlipidemia Mother   . Colon cancer Mother     deceased 2014-02-12  . Hypertension Mother   . Stroke Mother   . Goiter Daughter     Social History   Social History  . Marital Status: Single    Spouse Name: N/A  . Number of Children: 2  . 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 Smoking status: Never Smoker   . Smokeless tobacco: Never Used  . Alcohol Use: No  . Drug Use: No  . Sexual Activity:    Partners: Male    Birth Control/ Protection: IUD   Other Topics Concern  . Not on file   Social History Narrative   Exercises 4 x a week, cardio and strength training      Review of systems: Review of Systems  Constitutional: Negative for fever and chills.  HENT: Negative.   Eyes: Negative for blurred vision.  Respiratory:  Negative for cough, shortness of breath and wheezing.   Cardiovascular: Negative for chest pain and palpitations.  Gastrointestinal: as per HPI Genitourinary: Negative for dysuria, urgency, frequency and hematuria.  Musculoskeletal: Negative for myalgias, back pain and joint pain.  Skin: Negative for itching and rash.  Neurological: Negative for dizziness, tremors, focal weakness, seizures and loss of consciousness.  Endo/Heme/Allergies: Negative for environmental allergies.  Psychiatric/Behavioral: Negative for depression, suicidal ideas and hallucinations.  All other systems reviewed and are negative.   Physical Exam: Filed Vitals:   08/04/15 0909  BP: 152/92  Pulse: 88   Gen:      No acute distress HEENT:  EOMI, sclera anicteric Neck:     No masses; no thyromegaly Lungs:     Clear to auscultation bilaterally; normal respiratory effort CV:         Regular rate and rhythm; no murmurs Abd:      + bowel sounds; soft, non-tender; no palpable masses, no distension Ext:    No edema; adequate peripheral perfusion Skin:      Warm and dry; no rash Neuro: alert and oriented x 3 Psych: normal mood and affect  Data Reviewed:  Colonoscopy June 2010: Normal  I reviewed her recent labs and chart   Assessment and Plan/Recommendations: 54 year old African-American female here to discuss if she should be undergoing earlier screening colonoscopy given her positive family history of colon cancer. I explained to patient that given her mother was diagnosed with colon cancer at age of 55,  she is not considered to be high risk for colon cancer screening. Her last colonoscopy in June 2010, was normal with no polyps. She should follow the average risk colon screening protocol and is due for recall colonoscopy in June 2020.  If she develops any alarming symptoms of change in bowel habits or blood per rectum she should return for evaluation earlier. Damaris Hippo , MD [phone] Mon-Fri 8a-5p [phone] after 5p, weekends, holidays

## 2015-08-04 NOTE — Patient Instructions (Signed)
You will be due for a recall colonoscopy in 4 years, we will notify you when your colonoscopy is due Follow up as needed

## 2015-09-25 ENCOUNTER — Encounter: Payer: Self-pay | Admitting: *Deleted

## 2015-09-25 ENCOUNTER — Ambulatory Visit (INDEPENDENT_AMBULATORY_CARE_PROVIDER_SITE_OTHER): Payer: Federal, State, Local not specified - PPO | Admitting: Obstetrics and Gynecology

## 2015-09-25 ENCOUNTER — Encounter: Payer: Self-pay | Admitting: Obstetrics and Gynecology

## 2015-09-25 VITALS — BP 139/95 | HR 77 | Resp 18 | Ht 65.0 in | Wt 180.0 lb

## 2015-09-25 DIAGNOSIS — Z01419 Encounter for gynecological examination (general) (routine) without abnormal findings: Secondary | ICD-10-CM

## 2015-09-25 DIAGNOSIS — Z1151 Encounter for screening for human papillomavirus (HPV): Secondary | ICD-10-CM | POA: Diagnosis not present

## 2015-09-25 DIAGNOSIS — Z30432 Encounter for removal of intrauterine contraceptive device: Secondary | ICD-10-CM | POA: Diagnosis not present

## 2015-09-25 DIAGNOSIS — Z124 Encounter for screening for malignant neoplasm of cervix: Secondary | ICD-10-CM | POA: Diagnosis not present

## 2015-09-25 NOTE — Progress Notes (Signed)
Pt here today for annual physical exam, requesting IUD to be removed.

## 2015-09-25 NOTE — Progress Notes (Signed)
  Subjective:     [name] is a 54 y.o. female G2P2 postmenopausal for 2 years with BMI 29 who is here for a comprehensive physical exam. The patient reports no problems. She is not sexually active. She has had a paraguard IUD since 2008 and would like it removed. She denies any abnormal vaginal bleeding or pelvic pain. She does experience some hot flushes and night sweats and insomnia at times.  Past Medical History  Diagnosis Date  . Hyperlipidemia   . Hypertension     with some white coat component  . Fibroids     uterine  . Abnormal finding on Pap smear 2006    endometrial cells, biopsy ok  . History of pelvic ultrasound 10/2007    uterine fibroids  . Herpes zoster     mild case  . Anxiety    Past Surgical History  Procedure Laterality Date  . Meniscus repair Right 2002, 03/07/2015  . Intrauterine device insertion  2006  . Wisdom tooth extraction    . Cryoablation  1982    abnormal pap   Family History  Problem Relation Age of Onset  . Glaucoma Father   . Heart disease Mother   . Heart attack Mother     stent  . Hyperlipidemia Mother   . Colon cancer Mother     deceased Mar 02, 2014  . Hypertension Mother   . Stroke Mother   . Goiter Daughter      Social History   Social History  . Marital Status: Single    Spouse Name: N/A  . Number of Children: 2  . 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 Smoking status: Never Smoker   . Smokeless tobacco: Never Used  . Alcohol Use: No  . Drug Use: No  . Sexual Activity:    Partners: Male    Birth Control/ Protection: IUD   Other Topics Concern  . Not on file   Social History Narrative   Exercises 4 x a week, cardio and strength training   Health Maintenance  Topic Date Due  . INFLUENZA VACCINE  05/12/2015  . Hepatitis C Screening  11/12/2020 (Originally 1960/11/12)  . HIV Screening  11/12/2020 (Originally 10/15/1975)  . MAMMOGRAM  10/31/2015  . PAP SMEAR   06/14/2017  . COLONOSCOPY  03/18/2019  . TETANUS/TDAP  10/10/2022      Review of Systems Pertinent items are noted in HPI.   Objective:      GENERAL: Well-developed, well-nourished female in no acute distress.  HEENT: Normocephalic, atraumatic. Sclerae anicteric.  NECK: Supple. Normal thyroid.  LUNGS: Clear to auscultation bilaterally.  HEART: Regular rate and rhythm. BREASTS: Symmetric in size. No palpable masses or lymphadenopathy, skin changes, or nipple drainage. ABDOMEN: Soft, nontender, nondistended. No organomegaly. PELVIC: Normal external female genitalia. Vagina is pink and rugated.  Normal discharge. Normal appearing cervix with IUD strings extending 2 cm from os. Uterus is normal in size. No adnexal mass or tenderness. EXTREMITIES: No cyanosis, clubbing, or edema, 2+ distal pulses.    Assessment:    Healthy female exam.      Plan:    Pap smear collected today Patient due for screening mammogram in January Patient advised to perform monthly self breast and vulva exam IUD removed without complications using a Ring Forceps. Patient tolerated the procedure well Encouraged the patient to continue her weight loss eforts See After Visit Summary for Counseling Recommendations

## 2015-09-26 LAB — CYTOLOGY - PAP

## 2015-09-30 ENCOUNTER — Other Ambulatory Visit: Payer: Self-pay

## 2015-09-30 DIAGNOSIS — Z1231 Encounter for screening mammogram for malignant neoplasm of breast: Secondary | ICD-10-CM

## 2015-10-31 ENCOUNTER — Ambulatory Visit
Admission: RE | Admit: 2015-10-31 | Discharge: 2015-10-31 | Disposition: A | Discharge status: Home / Self Care | Payer: Federal, State, Local not specified - PPO | Source: Ambulatory Visit

## 2015-10-31 DIAGNOSIS — Z1231 Encounter for screening mammogram for malignant neoplasm of breast: Secondary | ICD-10-CM

## 2015-11-03 ENCOUNTER — Encounter: Payer: Self-pay | Admitting: Family Medicine

## 2015-11-03 ENCOUNTER — Encounter: Payer: Self-pay | Admitting: *Deleted

## 2015-12-19 ENCOUNTER — Ambulatory Visit (INDEPENDENT_AMBULATORY_CARE_PROVIDER_SITE_OTHER): Payer: Federal, State, Local not specified - PPO | Admitting: Family Medicine

## 2015-12-19 ENCOUNTER — Encounter: Payer: Self-pay | Admitting: Family Medicine

## 2015-12-19 VITALS — BP 136/86 | HR 65 | Temp 98.0°F | Ht 64.5 in | Wt 175.0 lb

## 2015-12-19 DIAGNOSIS — F341 Dysthymic disorder: Secondary | ICD-10-CM

## 2015-12-19 NOTE — Progress Notes (Signed)
Pre visit review using our clinic review tool, if applicable. No additional management support is needed unless otherwise documented below in the visit note. 

## 2015-12-19 NOTE — Patient Instructions (Signed)
Since you do not talk to people much - I would like to have you start writing in a journal  Talk to your friend  Continue to exercise  Get outdoors - get exposure to sunlight  Stop at check out for referral to counseling  Update me in the meantime if worse or changes

## 2015-12-19 NOTE — Progress Notes (Signed)
Subjective:    Patient ID: [name], female    DOB: 21-Dec-1960, 55 y.o.   MRN: [id number]  HPI Here for problems with mood   Cannot seem to get rid of her "doom and gloom feeling"   In the past more anxious - now that is improved More down lately -feels stuck in a mood/ can't get out  Unmotivated - this is unlike her   Goes to work Hard to make herself go work out in the am -going less than she was (though it makes her feel better when she does go) Comes home tired  Feels herself withdrawing - staying by herself  Not socializing much-has to make herself  Joined a book club   Wants to feel like she used to   Still enjoys working out - just hard to initiate it   Is apprehensive to doing things by herself  For example hiking  Nervous to sign up for groups or travel   Does not have anyone to talk to /no big support   Stress/fear: that she is always going to be by herself   Sleeps ok sometimes/ other times it is hard to go to sleep  occ wakes up early and does not sleep the whole night through   Caffeine -1 c coffee in am  Trying to eat a healthier diet- lost 5 lb  No change in her appetite (does crave sweets)  Family hx - daughter has a hx of depression  Thinks her mother may have been prone to depression  No suicide in family   No thoughts of self harm -ever   Is interested in counseling   Patient Active Problem List   Diagnosis Date Noted  . Insect bite 07/07/2015  . Family history of colon cancer 05/14/2015  . Travel advice encounter 01/31/2015  . Dysthymia 10/17/2013  . Routine general medical examination at a health care facility 10/01/2012  . BACK PAIN 08/18/2010  . ESSENTIAL HYPERTENSION, BENIGN 01/29/2010  . Hyperlipidemia 10/09/2007  . OSTEOARTHRITIS, KNEE 10/09/2007   Past Medical History  Diagnosis Date  . Hyperlipidemia   . Hypertension     with some white coat component  . Fibroids     uterine  . Abnormal finding on Pap smear 2006   endometrial cells, biopsy ok  . History of pelvic ultrasound 10/2007    uterine fibroids  . Herpes zoster     mild case  . Anxiety    Past Surgical History  Procedure Laterality Date  . Meniscus repair Right 2002, 03/07/2015  . Intrauterine device insertion  2006  . Wisdom tooth extraction    . Cryoablation  1982    abnormal pap   Social History  Substance Use Topics  . Smoking status: Never Smoker   . Smokeless tobacco: Never Used  . Alcohol Use: No   Family History  Problem Relation Age of Onset  . Glaucoma Father   . Heart disease Mother   . Heart attack Mother     stent  . Hyperlipidemia Mother   . Colon cancer Mother     deceased 02-25-2014  . Hypertension Mother   . Stroke Mother   . Goiter Daughter    No Known Allergies Current Outpatient Prescriptions on File Prior to Visit  Medication Sig Dispense Refill  . hydrochlorothiazide (MICROZIDE) 12.5 MG capsule Take 1 capsule (12.5 mg total) by mouth every morning. 90 capsule 3  . Multiple Vitamin (MULTIVITAMIN) capsule Take 1 capsule by mouth daily.  No current facility-administered medications on file prior to visit.     Review of Systems    Review of Systems  Constitutional: Negative for fever, appetite change, fatigue and unexpected weight change.  Eyes: Negative for pain and visual disturbance.  Respiratory: Negative for cough and shortness of breath.   Cardiovascular: Negative for cp or palpitations    Gastrointestinal: Negative for nausea, diarrhea and constipation.  Genitourinary: Negative for urgency and frequency.  Skin: Negative for pallor or rash   Neurological: Negative for weakness, light-headedness, numbness and headaches.  Hematological: Negative for adenopathy. Does not bruise/bleed easily.  Psychiatric/Behavioral: pos for dysphoric and anxious mood/ neg for SI      Objective:   Physical Exam  Constitutional: She appears well-developed and well-nourished. No distress.  overwt and well app   HENT:  Head: Normocephalic and atraumatic.  Eyes: Conjunctivae and EOM are normal. Pupils are equal, round, and reactive to light. No scleral icterus.  Neck: Normal range of motion. Neck supple. No thyromegaly present.  Cardiovascular: Normal rate, regular rhythm and normal heart sounds.   Pulmonary/Chest: Effort normal and breath sounds normal.  Musculoskeletal: She exhibits no edema.  Lymphadenopathy:    She has no cervical adenopathy.  Neurological: She is alert. She has normal reflexes. She displays no tremor. No cranial nerve deficit. She exhibits normal muscle tone. Coordination normal.  Skin: Skin is warm and dry. No rash noted. No erythema. No pallor.  Psychiatric: Her speech is normal and behavior is normal. Thought content normal. Her mood appears not anxious. Her affect is not blunt, not labile and not inappropriate. Thought content is not paranoid. She exhibits a depressed mood. She expresses no homicidal and no suicidal ideation.  Talkative and pleasant            Assessment & Plan:   Problem List Items Addressed This Visit      Other   Dysthymia - Primary    Without SI Feeling down and sometimes anxious Reviewed stressors/ coping techniques/symptoms/ support sources/ tx options and side effects in detail today  Decided to ref to counseling -pt is eager to disc symptoms and stressors  If no imp would consider medication  >25 minutes spent in face to face time with patient, >50% spent in counselling or coordination of care       Relevant Orders   Ambulatory referral to Psychology

## 2015-12-21 NOTE — Assessment & Plan Note (Signed)
Without SI Feeling down and sometimes anxious Reviewed stressors/ coping techniques/symptoms/ support sources/ tx options and side effects in detail today  Decided to ref to counseling -pt is eager to disc symptoms and stressors  If no imp would consider medication  >25 minutes spent in face to face time with patient, >50% spent in counselling or coordination of care

## 2016-01-29 ENCOUNTER — Ambulatory Visit: Payer: Federal, State, Local not specified - PPO | Admitting: Psychology

## 2016-03-03 DIAGNOSIS — K080 Exfoliation of teeth due to systemic causes: Secondary | ICD-10-CM | POA: Diagnosis not present

## 2016-04-21 ENCOUNTER — Encounter: Payer: Self-pay | Admitting: Family Medicine

## 2016-04-21 ENCOUNTER — Ambulatory Visit (INDEPENDENT_AMBULATORY_CARE_PROVIDER_SITE_OTHER): Payer: Federal, State, Local not specified - PPO | Admitting: Family Medicine

## 2016-04-21 VITALS — BP 140/82 | HR 91 | Temp 98.1°F | Ht 64.5 in | Wt 179.5 lb

## 2016-04-21 DIAGNOSIS — J019 Acute sinusitis, unspecified: Secondary | ICD-10-CM | POA: Insufficient documentation

## 2016-04-21 DIAGNOSIS — J0100 Acute maxillary sinusitis, unspecified: Secondary | ICD-10-CM | POA: Diagnosis not present

## 2016-04-21 MED ORDER — AMOXICILLIN-POT CLAVULANATE 875-125 MG PO TABS: 1.0000 | ORAL_TABLET | Freq: Two times a day (BID) | ORAL | Status: DC

## 2016-04-21 NOTE — Assessment & Plan Note (Signed)
Cover with augmentin  Also add nasacort otc daily for congestion  Nasal saline prn Disc symptomatic care - see instructions on AVS  Update if not starting to improve in a week or if worsening

## 2016-04-21 NOTE — Progress Notes (Signed)
Subjective:    Patient ID: [name], female    DOB: 07-04-1961, 55 y.o.   MRN: [id number]  HPI Here for cough and congestion for 3 weeks   Her kids had been sick with uri Then she got a cold Then that cleared up end of may - and then symptoms returned  Has a cough /tickle  ? Wondered if a cold was coming back R nostril keeps draining - mostly clear pnd makes her cough  A little bit of production - still clear   No wheeze or trouble breathing  No fever  No sinus pain but does have pressure  She has "sinus headaches" when the weather changes- usually on R side   Drainage worsens with changes in env and temp - ? Worse out or inside   OTC: - has tried zyrtec/claritin- they make her sleepy  No nasal sprays   Patient Active Problem List   Diagnosis Date Noted  . Acute sinusitis 04/21/2016  . Insect bite 07/07/2015  . Family history of colon cancer 05/14/2015  . Travel advice encounter 01/31/2015  . Dysthymia 10/17/2013  . Routine general medical examination at a health care facility 10/01/2012  . BACK PAIN 08/18/2010  . ESSENTIAL HYPERTENSION, BENIGN 01/29/2010  . Hyperlipidemia 10/09/2007  . OSTEOARTHRITIS, KNEE 10/09/2007   Past Medical History  Diagnosis Date  . Hyperlipidemia   . Hypertension     with some white coat component  . Fibroids     uterine  . Abnormal finding on Pap smear 2006    endometrial cells, biopsy ok  . History of pelvic ultrasound 10/2007    uterine fibroids  . Herpes zoster     mild case  . Anxiety    Past Surgical History  Procedure Laterality Date  . Meniscus repair Right 2002, 03/07/2015  . Intrauterine device insertion  2006  . Wisdom tooth extraction    . Cryoablation  1982    abnormal pap   Social History  Substance Use Topics  . Smoking status: Never Smoker   . Smokeless tobacco: Never Used  . Alcohol Use: No   Family History  Problem Relation Age of Onset  . Glaucoma Father   . Heart disease Mother   . Heart attack  Mother     stent  . Hyperlipidemia Mother   . Colon cancer Mother     deceased 02-23-2014  . Hypertension Mother   . Stroke Mother   . Goiter Daughter    No Known Allergies Current Outpatient Prescriptions on File Prior to Visit  Medication Sig Dispense Refill  . hydrochlorothiazide (MICROZIDE) 12.5 MG capsule Take 1 capsule (12.5 mg total) by mouth every morning. 90 capsule 3  . Multiple Vitamin (MULTIVITAMIN) capsule Take 1 capsule by mouth daily.       No current facility-administered medications on file prior to visit.    Review of Systems  Constitutional: Positive for appetite change. Negative for fever and fatigue.  HENT: Positive for congestion, postnasal drip, rhinorrhea, sinus pressure and sore throat. Negative for ear pain and nosebleeds.   Eyes: Negative for pain, redness and itching.  Respiratory: Positive for cough. Negative for shortness of breath and wheezing.   Cardiovascular: Negative for chest pain.  Gastrointestinal: Negative for nausea, vomiting, abdominal pain and diarrhea.  Endocrine: Negative for polyuria.  Genitourinary: Negative for dysuria, urgency and frequency.  Musculoskeletal: Negative for myalgias and arthralgias.  Allergic/Immunologic: Negative for immunocompromised state.  Neurological: Positive for headaches. Negative for dizziness, tremors,  syncope, weakness and numbness.  Hematological: Negative for adenopathy. Does not bruise/bleed easily.  Psychiatric/Behavioral: Negative for dysphoric mood. The patient is not nervous/anxious.        Objective:   Physical Exam  Constitutional: She appears well-developed and well-nourished. No distress.  overwt and well appearing   HENT:  Head: Normocephalic and atraumatic.  Right Ear: External ear normal.  Left Ear: External ear normal.  Mouth/Throat: Oropharynx is clear and moist. No oropharyngeal exudate.  Nares are injected and congested (worse on the R) No sinus tenderness today  Post nasal drip     Eyes: Conjunctivae and EOM are normal. Pupils are equal, round, and reactive to light. Right eye exhibits no discharge. Left eye exhibits no discharge.  Neck: Normal range of motion. Neck supple.  Cardiovascular: Normal rate and regular rhythm.   Pulmonary/Chest: Effort normal and breath sounds normal. No respiratory distress. She has no wheezes. She has no rales.  Lymphadenopathy:    She has no cervical adenopathy.  Neurological: She is alert. No cranial nerve deficit.  Skin: Skin is warm and dry. No rash noted.  Psychiatric: She has a normal mood and affect.          Assessment & Plan:   Problem List Items Addressed This Visit      Respiratory   Acute sinusitis - Primary    Cover with augmentin  Also add nasacort otc daily for congestion  Nasal saline prn Disc symptomatic care - see instructions on AVS  Update if not starting to improve in a week or if worsening        Relevant Medications   amoxicillin-clavulanate (AUGMENTIN) 875-125 MG tablet

## 2016-04-21 NOTE — Progress Notes (Signed)
Pre visit review using our clinic review tool, if applicable. No additional management support is needed unless otherwise documented below in the visit note. 

## 2016-04-21 NOTE — Patient Instructions (Signed)
I think you have a sinus infection  Drink lots of water  Get nasacort nasal spray (or flonase) and use as directed -for at least 2-4 weeks Take the augmentin as directed Update if not starting to improve in a week or if worsening

## 2016-07-30 ENCOUNTER — Other Ambulatory Visit: Payer: Self-pay | Admitting: Family Medicine

## 2016-09-14 DIAGNOSIS — J343 Hypertrophy of nasal turbinates: Secondary | ICD-10-CM | POA: Diagnosis not present

## 2016-09-14 DIAGNOSIS — J310 Chronic rhinitis: Secondary | ICD-10-CM | POA: Diagnosis not present

## 2016-09-14 DIAGNOSIS — R0982 Postnasal drip: Secondary | ICD-10-CM | POA: Diagnosis not present

## 2016-09-16 DIAGNOSIS — K080 Exfoliation of teeth due to systemic causes: Secondary | ICD-10-CM | POA: Diagnosis not present

## 2016-09-24 ENCOUNTER — Other Ambulatory Visit: Payer: Self-pay | Admitting: Family Medicine

## 2016-09-24 DIAGNOSIS — Z1231 Encounter for screening mammogram for malignant neoplasm of breast: Secondary | ICD-10-CM

## 2016-09-30 DIAGNOSIS — K080 Exfoliation of teeth due to systemic causes: Secondary | ICD-10-CM | POA: Diagnosis not present

## 2016-10-08 DIAGNOSIS — M25572 Pain in left ankle and joints of left foot: Secondary | ICD-10-CM | POA: Diagnosis not present

## 2016-10-08 DIAGNOSIS — M722 Plantar fascial fibromatosis: Secondary | ICD-10-CM | POA: Diagnosis not present

## 2016-10-14 ENCOUNTER — Ambulatory Visit (INDEPENDENT_AMBULATORY_CARE_PROVIDER_SITE_OTHER): Payer: Federal, State, Local not specified - PPO | Admitting: Obstetrics and Gynecology

## 2016-10-14 ENCOUNTER — Encounter: Payer: Self-pay | Admitting: Obstetrics and Gynecology

## 2016-10-14 DIAGNOSIS — Z0000 Encounter for general adult medical examination without abnormal findings: Secondary | ICD-10-CM | POA: Diagnosis not present

## 2016-10-14 DIAGNOSIS — Z01419 Encounter for gynecological examination (general) (routine) without abnormal findings: Secondary | ICD-10-CM | POA: Diagnosis not present

## 2016-10-14 NOTE — Progress Notes (Signed)
Normal Pap 09/2015 - Pt desires pap today Normal MM 10/2015 - Already has appt for next MM Declined Flu Vaccine

## 2016-10-14 NOTE — Progress Notes (Signed)
Subjective:     [name] is a 56 y.o. postmenopausal female who is here for a comprehensive physical exam. The patient reports no problems. She denies any vaginal bleeding. She is not sexually active. She denies any urinary incontinence. She denies any pelvic pain or abnormal discharge. She is trying to lose weight and is exercising 3-4 times weekly (cycling, swimming).  Past Medical History:  Diagnosis Date  . Abnormal finding on Pap smear 2006   endometrial cells, biopsy ok  . Anxiety   . Fibroids    uterine  . Herpes zoster    mild case  . History of pelvic ultrasound 10/2007   uterine fibroids  . Hyperlipidemia   . Hypertension    with some white coat component   Past Surgical History:  Procedure Laterality Date  . CRYOABLATION  1982   abnormal pap  . INTRAUTERINE DEVICE INSERTION  2006  . MENISCUS REPAIR Right 2002, 03/07/2015  . WISDOM TOOTH EXTRACTION     Family History  Problem Relation Age of Onset  . Glaucoma Father   . Heart disease Mother   . Heart attack Mother     stent  . Hyperlipidemia Mother   . Colon cancer Mother     deceased 03-17-14  . Hypertension Mother   . Stroke Mother   . Goiter Daughter      Social History   Social History  . Marital status: Single    Spouse name: N/A  . Number of children: 2  . Years of education: N/A   Occupational History  . accountant [employer]   Social History Main Topics  . Smoking status: Never Smoker  . Smokeless tobacco: Never Used  . Alcohol use No  . Drug use: No  . Sexual activity: Not Currently    Partners: Male    Birth control/ protection: IUD   Other Topics Concern  . Not on file   Social History Narrative   Exercises 4 x a week, cardio and strength training   Health Maintenance  Topic Date Due  . INFLUENZA VACCINE  10/14/2017 (Originally 05/11/2016)  . Hepatitis C Screening  11/12/2020 (Originally [birth year])  . HIV Screening  11/12/2020 (Originally 10/15/1973)  . MAMMOGRAM  10/30/2016  . PAP  SMEAR  09/24/2018  . COLONOSCOPY  03/18/2019  . TETANUS/TDAP  10/10/2022       Review of Systems Pertinent items are noted in HPI.   Objective:  Last menstrual period 01/25/2012.     GENERAL: Well-developed, well-nourished female in no acute distress.  HEENT: Normocephalic, atraumatic. Sclerae anicteric.  NECK: Supple. Normal thyroid.  LUNGS: Clear to auscultation bilaterally.  HEART: Regular rate and rhythm. BREASTS: Symmetric in size. No palpable masses or lymphadenopathy, skin changes, or nipple drainage. ABDOMEN: Soft, nontender, nondistended. No organomegaly. PELVIC: Normal external female genitalia. Vagina is pink and rugated.  Normal discharge. Normal appearing cervix. Uterus is normal in size. No adnexal mass or tenderness. EXTREMITIES: No cyanosis, clubbing, or edema, 2+ distal pulses.    Assessment:    Healthy female exam.      Plan:    pap smear collected Patient states she will have labs done in February with PCP Patient is scheduled for mammogram later this month Patient will be contacted with any abnormal results RTC in 1 year or prn See After Visit Summary for Counseling Recommendations

## 2016-10-18 LAB — CYTOLOGY - PAP: Diagnosis: NEGATIVE

## 2016-11-01 ENCOUNTER — Ambulatory Visit
Admission: RE | Admit: 2016-11-01 | Discharge: 2016-11-01 | Disposition: A | Discharge status: Home / Self Care | Payer: Federal, State, Local not specified - PPO | Source: Ambulatory Visit | Attending: Family Medicine | Admitting: Family Medicine

## 2016-11-01 DIAGNOSIS — Z1231 Encounter for screening mammogram for malignant neoplasm of breast: Secondary | ICD-10-CM | POA: Diagnosis not present

## 2016-11-07 ENCOUNTER — Telehealth: Payer: Self-pay | Admitting: Family Medicine

## 2016-11-07 DIAGNOSIS — Z0000 Encounter for general adult medical examination without abnormal findings: Secondary | ICD-10-CM

## 2016-11-07 NOTE — Telephone Encounter (Signed)
-----   Message from Ellamae Sia sent at 11/02/2016  3:57 PM EST ----- Regarding: lab orders for Thursday, 2.1.18 Patient is scheduled for CPX labs, please order future labs, Thanks , Karna Christmas

## 2016-11-11 ENCOUNTER — Other Ambulatory Visit (INDEPENDENT_AMBULATORY_CARE_PROVIDER_SITE_OTHER): Payer: Federal, State, Local not specified - PPO

## 2016-11-11 DIAGNOSIS — Z0000 Encounter for general adult medical examination without abnormal findings: Secondary | ICD-10-CM | POA: Diagnosis not present

## 2016-11-11 LAB — LIPID PANEL
Cholesterol: 231 mg/dL — ABNORMAL HIGH (ref 0–200)
HDL: 62.5 mg/dL (ref >39.00)
LDL Cholesterol: 140 mg/dL — ABNORMAL HIGH (ref 0–99)
NonHDL: 168.08
Total CHOL/HDL Ratio: 4
Triglycerides: 140 mg/dL (ref 0.0–149.0)
VLDL: 28 mg/dL (ref 0.0–40.0)

## 2016-11-11 LAB — CBC WITH DIFFERENTIAL/PLATELET
Basophils Absolute: 0.1 10*3/uL (ref 0.0–0.1)
Basophils Relative: 0.7 % (ref 0.0–3.0)
Eosinophils Absolute: 0.2 10*3/uL (ref 0.0–0.7)
Eosinophils Relative: 2.9 % (ref 0.0–5.0)
HCT: 39.6 % (ref 36.0–46.0)
Hemoglobin: 13.3 g/dL (ref 12.0–15.0)
Lymphocytes Relative: 28.9 % (ref 12.0–46.0)
Lymphs Abs: 2.1 10*3/uL (ref 0.7–4.0)
MCHC: 33.6 g/dL (ref 30.0–36.0)
MCV: 95.8 fl (ref 78.0–100.0)
Monocytes Absolute: 0.6 10*3/uL (ref 0.1–1.0)
Monocytes Relative: 7.8 % (ref 3.0–12.0)
Neutro Abs: 4.4 10*3/uL (ref 1.4–7.7)
Neutrophils Relative %: 59.7 % (ref 43.0–77.0)
Platelets: 265 10*3/uL (ref 150.0–400.0)
RBC: 4.13 Mil/uL (ref 3.87–5.11)
RDW: 12.5 % (ref 11.5–15.5)
WBC: 7.3 10*3/uL (ref 4.0–10.5)

## 2016-11-11 LAB — COMPREHENSIVE METABOLIC PANEL
ALT: 9 U/L (ref 0–35)
AST: 19 U/L (ref 0–37)
Albumin: 4 g/dL (ref 3.5–5.2)
Alkaline Phosphatase: 100 U/L (ref 39–117)
BUN: 10 mg/dL (ref 6–23)
CO2: 31 mEq/L (ref 19–32)
Calcium: 9.6 mg/dL (ref 8.4–10.5)
Chloride: 102 mEq/L (ref 96–112)
Creatinine, Ser: 1.03 mg/dL (ref 0.40–1.20)
GFR: 70.76 mL/min (ref >60.00)
Glucose, Bld: 94 mg/dL (ref 70–99)
Potassium: 3.7 mEq/L (ref 3.5–5.1)
Sodium: 140 mEq/L (ref 135–145)
Total Bilirubin: 0.7 mg/dL (ref 0.2–1.2)
Total Protein: 7.7 g/dL (ref 6.0–8.3)

## 2016-11-11 LAB — TSH: TSH: 2.12 u[IU]/mL (ref 0.35–4.50)

## 2016-11-15 ENCOUNTER — Encounter: Payer: Self-pay | Admitting: Family Medicine

## 2016-11-15 ENCOUNTER — Ambulatory Visit (INDEPENDENT_AMBULATORY_CARE_PROVIDER_SITE_OTHER): Payer: Federal, State, Local not specified - PPO | Admitting: Family Medicine

## 2016-11-15 VITALS — BP 136/84 | HR 65 | Temp 98.1°F | Ht 64.75 in | Wt 183.2 lb

## 2016-11-15 DIAGNOSIS — E669 Obesity, unspecified: Secondary | ICD-10-CM

## 2016-11-15 DIAGNOSIS — E7800 Pure hypercholesterolemia, unspecified: Secondary | ICD-10-CM

## 2016-11-15 DIAGNOSIS — E663 Overweight: Secondary | ICD-10-CM | POA: Insufficient documentation

## 2016-11-15 DIAGNOSIS — Z800 Family history of malignant neoplasm of digestive organs: Secondary | ICD-10-CM | POA: Diagnosis not present

## 2016-11-15 DIAGNOSIS — Z0000 Encounter for general adult medical examination without abnormal findings: Secondary | ICD-10-CM | POA: Diagnosis not present

## 2016-11-15 DIAGNOSIS — J3489 Other specified disorders of nose and nasal sinuses: Secondary | ICD-10-CM

## 2016-11-15 DIAGNOSIS — I10 Essential (primary) hypertension: Secondary | ICD-10-CM

## 2016-11-15 DIAGNOSIS — Z23 Encounter for immunization: Secondary | ICD-10-CM

## 2016-11-15 MED ORDER — BUSPIRONE HCL 15 MG PO TABS: 7.5000 mg | ORAL_TABLET | Freq: Two times a day (BID) | ORAL | 0 refills | Status: DC | PRN

## 2016-11-15 MED ORDER — HYDROCHLOROTHIAZIDE 12.5 MG PO CAPS: 12.5000 mg | ORAL_CAPSULE | Freq: Every morning | ORAL | 3 refills | Status: DC

## 2016-11-15 NOTE — Assessment & Plan Note (Signed)
Discussed how this problem influences overall health and the risks it imposes  Reviewed plan for weight loss with lower calorie diet (via better food choices and also portion control or program like weight watchers) and exercise building up to or more than 30 minutes 5 days per week including some aerobic activity   Enc to continue exercise  She is interested in nutrition consult- will check on ins cov and get back to [place]  emph lean protien/fruits and vegetables

## 2016-11-15 NOTE — Assessment & Plan Note (Signed)
Cholesterol is up  Disc goals for lipids and reasons to control them Rev labs with pt Rev low sat fat diet in detail More high chol foods lately  Given handout on diet and general guidelines  Re check in 3 mo  Consider stain if no improvement

## 2016-11-15 NOTE — Assessment & Plan Note (Signed)
Chronic/ with sneezing and pnd  Allergy referral made

## 2016-11-15 NOTE — Assessment & Plan Note (Signed)
bp in fair control at this time  BP Readings from Last 1 Encounters:  11/15/16 136/84   No changes needed Disc lifstyle change with low sodium diet and exercise  Labs reviewed

## 2016-11-15 NOTE — Progress Notes (Signed)
Subjective:    Patient ID: [name], female    DOB: 1960-10-18, 56 y.o.   MRN: [id number]  HPI  Here for health maintenance exam and to review chronic medical problems   Has been doing pretty well   Taking care of herself   Having nasal symptoms - had a sinus infection this summer and used steroid ns  It -got better and then she has had symptoms up and down  Saw ENT- at that time not symptomatic  ? If developing allergies  Poss chemicals in the pool aggravate her /cat has never bothered her before  Drainage/ runny nose/cough  The steroid nasal spray and zyrtec work   Desires and allergist consult    Swims for exercise regularly  Wt Readings from Last 3 Encounters:  11/15/16 183 lb 4 oz (83.1 kg)  04/21/16 179 lb 8 oz (81.4 kg)  12/19/15 175 lb (79.4 kg)  she was eating ice cream for a while- last week  Ate poorly for the holidays  Exercise - gets in 3-4 days per week (slacked a bit when cold)  bmi is 30.7 Interested in seeing a nutritionist if it is covered by her insurance  Hep C/ HIV screening -declines due to low risk    Mammogram 1/18 negative  Self breast exam - no lumps or changes   Pap/gyn care : pap nl 1/18 nl  Has had cryo in the past  Still sees gyn    Flu vaccine - had it today Tetanus vaccine 12/13  Colonoscopy/ screening 6/10 nl with 10 y recall at that time-however Her mother had colon cancer  Did try to schedule one but was told she had 10 year recall /poss due to insurance?    bp is stable today  No cp or palpitations or headaches or edema  No side effects to medicines  BP Readings from Last 3 Encounters:  11/15/16 136/84  04/21/16 140/82  12/19/15 136/86   takes hctz  Hx of hyperlipidemia Diet controlled Lab Results  Component Value Date   CHOL 231 (H) 11/11/2016   CHOL 192 06/13/2015   CHOL 232 (H) 10/17/2013   Lab Results  Component Value Date   HDL 62.50 11/11/2016   HDL 56.80 06/13/2015   HDL 66.30 10/17/2013   Lab  Results  Component Value Date   LDLCALC 140 (H) 11/11/2016   LDLCALC 113 (H) 06/13/2015   LDLCALC 112 (H) 02/11/2010   Lab Results  Component Value Date   TRIG 140.0 11/11/2016   TRIG 112.0 06/13/2015   TRIG 122.0 10/17/2013   Lab Results  Component Value Date   CHOLHDL 4 11/11/2016   CHOLHDL 3 06/13/2015   CHOLHDL 3 10/17/2013   Lab Results  Component Value Date   LDLDIRECT 128.6 10/17/2013   LDLDIRECT 128.8 10/02/2012   LDLDIRECT 129.8 02/11/2011   Eating more cholesterol in foods  Also has family history   Results for orders placed or performed in visit on 11/11/16  CBC with Differential/Platelet  Result Value Ref Range   WBC 7.3 4.0 - 10.5 K/uL   RBC 4.13 3.87 - 5.11 Mil/uL   Hemoglobin 13.3 12.0 - 15.0 g/dL   HCT 39.6 36.0 - 46.0 %   MCV 95.8 78.0 - 100.0 fl   MCHC 33.6 30.0 - 36.0 g/dL   RDW 12.5 11.5 - 15.5 %   Platelets 265.0 150.0 - 400.0 K/uL   Neutrophils Relative % 59.7 43.0 - 77.0 %   Lymphocytes Relative 28.9 12.0 -  46.0 %   Monocytes Relative 7.8 3.0 - 12.0 %   Eosinophils Relative 2.9 0.0 - 5.0 %   Basophils Relative 0.7 0.0 - 3.0 %   Neutro Abs 4.4 1.4 - 7.7 K/uL   Lymphs Abs 2.1 0.7 - 4.0 K/uL   Monocytes Absolute 0.6 0.1 - 1.0 K/uL   Eosinophils Absolute 0.2 0.0 - 0.7 K/uL   Basophils Absolute 0.1 0.0 - 0.1 K/uL  Comprehensive metabolic panel  Result Value Ref Range   Sodium 140 135 - 145 mEq/L   Potassium 3.7 3.5 - 5.1 mEq/L   Chloride 102 96 - 112 mEq/L   CO2 31 19 - 32 mEq/L   Glucose, Bld 94 70 - 99 mg/dL   BUN 10 6 - 23 mg/dL   Creatinine, Ser 1.03 0.40 - 1.20 mg/dL   Total Bilirubin 0.7 0.2 - 1.2 mg/dL   Alkaline Phosphatase 100 39 - 117 U/L   AST 19 0 - 37 U/L   ALT 9 0 - 35 U/L   Total Protein 7.7 6.0 - 8.3 g/dL   Albumin 4.0 3.5 - 5.2 g/dL   Calcium 9.6 8.4 - 10.5 mg/dL   GFR 70.76 >60.00 mL/min  Lipid panel  Result Value Ref Range   Cholesterol 231 (H) 0 - 200 mg/dL   Triglycerides 140.0 0.0 - 149.0 mg/dL   HDL 62.50  >39.00 mg/dL   VLDL 28.0 0.0 - 40.0 mg/dL   LDL Cholesterol 140 (H) 0 - 99 mg/dL   Total CHOL/HDL Ratio 4    NonHDL 168.08   TSH  Result Value Ref Range   TSH 2.12 0.35 - 4.50 uIU/mL     Takes buspar as needed anx is better if she limits coffee  Patient Active Problem List   Diagnosis Date Noted  . Rhinorrhea 11/15/2016  . Obesity (BMI 30-39.9) 11/15/2016  . Family history of colon cancer 05/14/2015  . Travel advice encounter 01/31/2015  . Dysthymia 10/17/2013  . BACK PAIN 08/18/2010  . ESSENTIAL HYPERTENSION, BENIGN 01/29/2010  . Hyperlipidemia 10/09/2007  . OSTEOARTHRITIS, KNEE 10/09/2007   Past Medical History:  Diagnosis Date  . Abnormal finding on Pap smear 2006   endometrial cells, biopsy ok  . Anxiety   . Fibroids    uterine  . Herpes zoster    mild case  . History of pelvic ultrasound 10/2007   uterine fibroids  . Hyperlipidemia   . Hypertension    with some white coat component   Past Surgical History:  Procedure Laterality Date  . CRYOABLATION  1982   abnormal pap  . INTRAUTERINE DEVICE INSERTION  2006  . MENISCUS REPAIR Right 2002, 03/07/2015  . WISDOM TOOTH EXTRACTION     Social History  Substance Use Topics  . Smoking status: Never Smoker  . Smokeless tobacco: Never Used  . Alcohol use No   Family History  Problem Relation Age of Onset  . Glaucoma Father   . Heart disease Mother   . Heart attack Mother     stent  . Hyperlipidemia Mother   . Colon cancer Mother     deceased 2014-04-03  . Hypertension Mother   . Stroke Mother   . Goiter Daughter    No Known Allergies Current Outpatient Prescriptions on File Prior to Visit  Medication Sig Dispense Refill  . ipratropium (ATROVENT) 0.06 % nasal spray     . Multiple Vitamin (MULTIVITAMIN) capsule Take 1 capsule by mouth daily.       No current facility-administered  medications on file prior to visit.     Review of Systems Review of Systems  Constitutional: Negative for fever, appetite  change, fatigue and unexpected weight change.  Eyes: Negative for pain and visual disturbance.  ENT pos for rhinorrhea and pnd / neg for facial pain  Respiratory: Negative for cough and shortness of breath.   Cardiovascular: Negative for cp or palpitations    Gastrointestinal: Negative for nausea, diarrhea and constipation.  Genitourinary: Negative for urgency and frequency.  Skin: Negative for pallor or rash   Neurological: Negative for weakness, light-headedness, numbness and headaches.  Hematological: Negative for adenopathy. Does not bruise/bleed easily.  Psychiatric/Behavioral: Negative for dysphoric mood. The patient is not nervous/anxious.         Objective:   Physical Exam  Constitutional: She appears well-developed and well-nourished. No distress.  obese and well appearing   HENT:  Head: Normocephalic and atraumatic.  Right Ear: External ear normal.  Left Ear: External ear normal.  Nose: Nose normal.  Mouth/Throat: Oropharynx is clear and moist.  Nares are boggy  Clear pnd   Eyes: Conjunctivae and EOM are normal. Pupils are equal, round, and reactive to light. Right eye exhibits no discharge. Left eye exhibits no discharge. No scleral icterus.  Neck: Normal range of motion. Neck supple. No JVD present. Carotid bruit is not present. No thyromegaly present.  Cardiovascular: Normal rate, regular rhythm, normal heart sounds and intact distal pulses.  Exam reveals no gallop.   Pulmonary/Chest: Effort normal and breath sounds normal. No respiratory distress. She has no wheezes. She has no rales.  Abdominal: Soft. Bowel sounds are normal. She exhibits no distension and no mass. There is no tenderness.  Musculoskeletal: She exhibits no edema or tenderness.  Lymphadenopathy:    She has no cervical adenopathy.  Neurological: She is alert. She has normal reflexes. No cranial nerve deficit. She exhibits normal muscle tone. Coordination normal.  Skin: Skin is warm and dry. No rash  noted. No erythema. No pallor.  Psychiatric: She has a normal mood and affect. Her mood appears not anxious. She does not exhibit a depressed mood.  Mood is good          Assessment & Plan:   Problem List Items Addressed This Visit      Cardiovascular and Mediastinum   ESSENTIAL HYPERTENSION, BENIGN    bp in fair control at this time  BP Readings from Last 1 Encounters:  11/15/16 136/84   No changes needed Disc lifstyle change with low sodium diet and exercise  Labs reviewed       Relevant Medications   hydrochlorothiazide (MICROZIDE) 12.5 MG capsule     Other   Family history of colon cancer    Unsure why she was denied 5 y recall (colonoscopy 2010) since mother had colon cancer  She will check with insurance to see if this would be covered and get back to [place]      Hyperlipidemia    Cholesterol is up  Disc goals for lipids and reasons to control them Rev labs with pt Rev low sat fat diet in detail More high chol foods lately  Given handout on diet and general guidelines  Re check in 3 mo  Consider stain if no improvement       Relevant Medications   hydrochlorothiazide (MICROZIDE) 12.5 MG capsule   Obesity (BMI 30-39.9)    Discussed how this problem influences overall health and the risks it imposes  Reviewed plan for weight loss with  lower calorie diet (via better food choices and also portion control or program like weight watchers) and exercise building up to or more than 30 minutes 5 days per week including some aerobic activity   Enc to continue exercise  She is interested in nutrition consult- will check on ins cov and get back to [place]  emph lean protien/fruits and vegetables       Rhinorrhea    Chronic/ with sneezing and pnd  Allergy referral made       Relevant Orders   Ambulatory referral to Allergy   Routine general medical examination at a health care facility    Reviewed health habits including diet and exercise and skin cancer  prevention Reviewed appropriate screening tests for age  Also reviewed health mt list, fam hx and immunization status , as well as social and family history   See HPI  Mammogram and gyn care utd  Disc plan for wt loss Also for low cholesterol diet and re check  Would like to get a 5 year recall colonoscopy for family history  Declines hep C and HIV screening  imms utd  Labs reviewed        Other Visit Diagnoses    Need for influenza vaccination    -  Primary   Relevant Orders   Flu Vaccine QUAD 36+ mos IM (Completed)

## 2016-11-15 NOTE — Progress Notes (Signed)
Pre visit review using our clinic review tool, if applicable. No additional management support is needed unless otherwise documented below in the visit note. 

## 2016-11-15 NOTE — Assessment & Plan Note (Signed)
Reviewed health habits including diet and exercise and skin cancer prevention Reviewed appropriate screening tests for age  Also reviewed health mt list, fam hx and immunization status , as well as social and family history   See HPI  Mammogram and gyn care utd  Disc plan for wt loss Also for low cholesterol diet and re check  Would like to get a 5 year recall colonoscopy for family history  Declines hep C and HIV screening  imms utd  Labs reviewed

## 2016-11-15 NOTE — Assessment & Plan Note (Signed)
Unsure why she was denied 5 y recall (colonoscopy 2010) since mother had colon cancer  She will check with insurance to see if this would be covered and get back to [place]

## 2016-11-15 NOTE — Patient Instructions (Addendum)
Stop at check out for referrals   For cholesterol :   Avoid red meat/ fried foods/ egg yolks/ fatty breakfast meats/ butter, cheese and high fat dairy/ and shellfish    Call your insurance co to see if they cover a nutritionist consult for cholesterol and weight control  Let us know if you want a referral   Call your insurance to see if it covers a 5 year colonoscopy for family history   Schedule fasting labs in 3 months for cholesterol

## 2016-11-17 ENCOUNTER — Encounter: Payer: Self-pay | Admitting: Family Medicine

## 2016-11-17 DIAGNOSIS — E7800 Pure hypercholesterolemia, unspecified: Secondary | ICD-10-CM

## 2016-11-17 DIAGNOSIS — Z800 Family history of malignant neoplasm of digestive organs: Secondary | ICD-10-CM

## 2016-11-17 DIAGNOSIS — E669 Obesity, unspecified: Secondary | ICD-10-CM

## 2016-11-24 DIAGNOSIS — M722 Plantar fascial fibromatosis: Secondary | ICD-10-CM | POA: Diagnosis not present

## 2016-11-26 ENCOUNTER — Other Ambulatory Visit: Payer: Self-pay | Admitting: Emergency Medicine

## 2016-11-26 ENCOUNTER — Ambulatory Visit (INDEPENDENT_AMBULATORY_CARE_PROVIDER_SITE_OTHER): Payer: Federal, State, Local not specified - PPO | Admitting: Gastroenterology

## 2016-11-26 ENCOUNTER — Encounter: Payer: Self-pay | Admitting: Gastroenterology

## 2016-11-26 VITALS — BP 130/78 | HR 84 | Ht 65.0 in | Wt 187.0 lb

## 2016-11-26 DIAGNOSIS — Z800 Family history of malignant neoplasm of digestive organs: Secondary | ICD-10-CM

## 2016-11-26 MED ORDER — NA SULFATE-K SULFATE-MG SULF 17.5-3.13-1.6 GM/177ML PO SOLN: ORAL | 0 refills | Status: DC

## 2016-11-26 NOTE — Progress Notes (Signed)
     11/26/2016 [name] [id number] 11-17-1960   HISTORY OF PRESENT ILLNESS:  This is a 56 year old known to Dr. Silverio Decamp.  Last colonoscopy was 03/2009 at which time the study was normal.  She has family history of colon cancer in her mother who was diagnosed in her late 35's.  Wants to discuss repeat colonoscopy.  No GI complaints.   Past Medical History:  Diagnosis Date  . Abnormal finding on Pap smear 2006   endometrial cells, biopsy ok  . Anxiety   . Fibroids    uterine  . Herpes zoster    mild case  . History of pelvic ultrasound 10/2007   uterine fibroids  . Hyperlipidemia   . Hypertension    with some white coat component   Past Surgical History:  Procedure Laterality Date  . CRYOABLATION  1982   abnormal pap  . INTRAUTERINE DEVICE INSERTION  2006  . MENISCUS REPAIR Right 2002, 03/07/2015  . WISDOM TOOTH EXTRACTION      reports that she has never smoked. She has never used smokeless tobacco. She reports that she does not drink alcohol or use drugs. family history includes Colon cancer in her mother; Glaucoma in her father; Goiter in her daughter; Heart attack in her mother; Heart disease in her mother; Hyperlipidemia in her mother; Hypertension in her mother; Stroke in her mother. No Known Allergies    Outpatient Encounter Prescriptions as of 11/26/2016  Medication Sig  . busPIRone (BUSPAR) 15 MG tablet Take 0.5 tablets (7.5 mg total) by mouth 2 (two) times daily as needed.  . hydrochlorothiazide (MICROZIDE) 12.5 MG capsule Take 1 capsule (12.5 mg total) by mouth every morning.  Marland Kitchen ipratropium (ATROVENT) 0.06 % nasal spray   . Multiple Vitamin (MULTIVITAMIN) capsule Take 1 capsule by mouth daily.     No facility-administered encounter medications on file as of 11/26/2016.      REVIEW OF SYSTEMS  : All other systems reviewed and negative except where noted in the History of Present Illness.   PHYSICAL EXAM: BP 130/78   Pulse 84   Ht 5\' 5"  (1.651 m)   Wt 187  lb (84.8 kg)   LMP 01/25/2012   BMI 31.12 kg/m  General: Well developed black female in no acute distress Head: Normocephalic and atraumatic Eyes:  Sclerae anicteric, conjunctiva pink. Ears: Normal auditory acuity Lungs: Clear throughout to auscultation; no increased WOB. Heart: Regular rate and rhythm Abdomen: Soft, non-distended.  Normal bowel sounds.  Non-tender. Rectal:  Will be done at the time of colonoscopy. Musculoskeletal: Symmetrical with no gross deformities  Skin: No lesions on visible extremities Extremities: No edema  Neurological: Alert oriented x 4, grossly non-focal Psychological:  Alert and cooperative. Normal mood and affect  ASSESSMENT AND PLAN: -Family history of colon cancer:  Last colon 03/2009.  Will schedule for colonoscopy with Dr. Silverio Decamp.  The risks, benefits, and alternatives to colonoscopy were discussed with the patient and she consents to proceed.   CC:  Tower, Wynelle Fanny, MD

## 2016-11-26 NOTE — Patient Instructions (Signed)

## 2016-12-03 ENCOUNTER — Encounter: Payer: Self-pay | Admitting: Gastroenterology

## 2016-12-03 ENCOUNTER — Encounter: Payer: Federal, State, Local not specified - PPO | Attending: Family Medicine | Admitting: Registered"

## 2016-12-03 DIAGNOSIS — E7800 Pure hypercholesterolemia, unspecified: Secondary | ICD-10-CM | POA: Diagnosis not present

## 2016-12-03 DIAGNOSIS — Z713 Dietary counseling and surveillance: Secondary | ICD-10-CM | POA: Diagnosis not present

## 2016-12-03 DIAGNOSIS — E669 Obesity, unspecified: Secondary | ICD-10-CM | POA: Diagnosis not present

## 2016-12-03 DIAGNOSIS — Z6839 Body mass index (BMI) 39.0-39.9, adult: Secondary | ICD-10-CM | POA: Diagnosis not present

## 2016-12-03 NOTE — Progress Notes (Signed)
Medical Nutrition Therapy:  Appt start time: 0800 end time:  0900.   Assessment:  Primary concerns today: Patient reports that she would like to learn how to control her BP, cholesterol and weight with diet. She reports that she is interested in vegetarian type diet. Cooks meat & potatoes for dad (doesn't live with her) wants some easy food preparation ideas for herself.   Preferred Learning Style:   No preference indicated   Learning Readiness:   Ready  MEDICATIONS: reviewed   DIETARY INTAKE:  Usual eating pattern includes 2-3 meals and several snacks per day.   24-hr recall:  B ( AM): apple cinnamon oatmeal, apples and pecans, coffee w sugar non-dairy creamer OR egg spinach toast sometimes milk with neslie quick  Snk ( AM): none  L ( PM): greens, black beans or lentils OR chicken OR panara black bean soup, & salad OR asian OR burger fries Snk ( PM): candy OR bakery items D ( PM): none OR healthy choice veg soup OR beef stew OR greens & black beans. OR fast food [employer] chicken strips Snk ( PM): sweet Beverages: coffee, some water.  Usual physical activity: 5 days week, variety of aerobic  Estimated energy needs: 1600 calories 180 g carbohydrates 120 g protein 44 g fat  Progress Towards Goal(s):  In progress.   Nutritional Diagnosis:  NI-5.6.3 Inappropriate intake of saturated fats  As related to cookies, cakes and history of breakfast meats.  As evidenced by diet recall and patient report of diet history.    Intervention:  Nutrition education. Reviewed dietary choices which affect cholesterol in the blood.  Plan: To check restaurant nutrition facts go to [email or web address]. Look for sodium content. Try smoothie in evening for your sweet tooth: Handful or 2 of greens, 1/2 yogurt, frozen fruit (berries are great), 1/2 banana. Consider trying soy products, tempeh, tofu, edamame as a great protein choice Healthy Choice Simply steamer is an option for quick, convenient  meal. Try new seasons in cooking  Teaching Method Utilized:  Visual Auditory  Handouts given during visit include:  Low sodium seasoning  My plate for healthy eating  Cholesterol and Triglycerides  Barriers to learning/adherence to lifestyle change: none  Demonstrated degree of understanding via:  Teach Back   Monitoring/Evaluation:  Dietary intake, exercise, lipid panel and body weight prn.

## 2016-12-03 NOTE — Patient Instructions (Addendum)
Plan: To check restaurant nutrition facts go to [email or web address]. Look for sodium content. Try smoothie in evening for your sweet tooth: Handful or 2 of greens, 1/2 yogurt, frozen fruit (berries are great), 1/2 banana. Consider trying soy products, tempeh, tofu, edamame as a great protein choice Healthy Choice Simply steamer is an option for quick, convenient meal. Try new seasons in cooking

## 2016-12-06 NOTE — Progress Notes (Signed)
Reviewed and agree with documentation and assessment and plan. K. Veena Ignace Mandigo , MD   

## 2016-12-09 ENCOUNTER — Encounter: Payer: Federal, State, Local not specified - PPO | Admitting: Gastroenterology

## 2016-12-14 DIAGNOSIS — J3089 Other allergic rhinitis: Secondary | ICD-10-CM | POA: Diagnosis not present

## 2016-12-14 DIAGNOSIS — J3081 Allergic rhinitis due to animal (cat) (dog) hair and dander: Secondary | ICD-10-CM | POA: Diagnosis not present

## 2016-12-14 DIAGNOSIS — J309 Allergic rhinitis, unspecified: Secondary | ICD-10-CM | POA: Diagnosis not present

## 2016-12-14 DIAGNOSIS — H1045 Other chronic allergic conjunctivitis: Secondary | ICD-10-CM | POA: Diagnosis not present

## 2016-12-17 ENCOUNTER — Encounter: Payer: Self-pay | Admitting: Gastroenterology

## 2016-12-17 ENCOUNTER — Ambulatory Visit (AMBULATORY_SURGERY_CENTER): Payer: Federal, State, Local not specified - PPO | Admitting: Gastroenterology

## 2016-12-17 VITALS — BP 154/94 | HR 68 | Temp 97.3°F | Resp 16 | Ht 65.0 in | Wt 187.0 lb

## 2016-12-17 DIAGNOSIS — Z1211 Encounter for screening for malignant neoplasm of colon: Secondary | ICD-10-CM | POA: Diagnosis present

## 2016-12-17 DIAGNOSIS — D125 Benign neoplasm of sigmoid colon: Secondary | ICD-10-CM

## 2016-12-17 DIAGNOSIS — Z800 Family history of malignant neoplasm of digestive organs: Secondary | ICD-10-CM

## 2016-12-17 DIAGNOSIS — Z1212 Encounter for screening for malignant neoplasm of rectum: Secondary | ICD-10-CM

## 2016-12-17 DIAGNOSIS — D126 Benign neoplasm of colon, unspecified: Secondary | ICD-10-CM | POA: Diagnosis not present

## 2016-12-17 DIAGNOSIS — K635 Polyp of colon: Secondary | ICD-10-CM | POA: Diagnosis not present

## 2016-12-17 DIAGNOSIS — D123 Benign neoplasm of transverse colon: Secondary | ICD-10-CM

## 2016-12-17 MED ORDER — SODIUM CHLORIDE 0.9 % IV SOLN: 500.0000 mL | INTRAVENOUS | Status: DC

## 2016-12-17 NOTE — Op Note (Signed)
[place] Patient Name: [name] Procedure Date: 12/17/2016 2:52 PM MRN: [id number] Endoscopist: Mauri Pole , MD Age: 56 Referring MD:  Date of Birth: 05-Jun-1961 Gender: Female Account #: [id number] Procedure:                Colonoscopy Indications:              Screening patient at increased risk: Family history                            of 1st-degree relative with colorectal cancer at                            age 36 years (or older) Medicines:                Monitored Anesthesia Care Procedure:                Pre-Anesthesia Assessment:                           - Prior to the procedure, a History and Physical                            was performed, and patient medications and                            allergies were reviewed. The patient's tolerance of                            previous anesthesia was also reviewed. The risks                            and benefits of the procedure and the sedation                            options and risks were discussed with the patient.                            All questions were answered, and informed consent                            was obtained. Prior Anticoagulants: The patient has                            taken no previous anticoagulant or antiplatelet                            agents. ASA Grade Assessment: II - A patient with                            mild systemic disease. After reviewing the risks                            and benefits, the patient was deemed in  satisfactory condition to undergo the procedure.                           After obtaining informed consent, the colonoscope                            was passed under direct vision. Throughout the                            procedure, the patient's blood pressure, pulse, and                            oxygen saturations were monitored continuously. The                            Colonoscope was introduced  through the anus and                            advanced to the the cecum, identified by                            appendiceal orifice and ileocecal valve. The                            colonoscopy was performed without difficulty. The                            patient tolerated the procedure well. The quality                            of the bowel preparation was good. The ileocecal                            valve, appendiceal orifice, and rectum were                            photographed. Scope In: 2:58:46 PM Scope Out: 3:15:31 PM Scope Withdrawal Time: 0 hours 12 minutes 26 seconds  Total Procedure Duration: 0 hours 16 minutes 45 seconds  Findings:                 The perianal and digital rectal examinations were                            normal.                           A 1 mm polyp was found in the transverse colon. The                            polyp was sessile. The polyp was removed with a                            cold biopsy forceps. Resection and retrieval were  complete.                           A 4 mm polyp was found in the sigmoid colon. The                            polyp was sessile. The polyp was removed with a                            cold snare. Resection and retrieval were complete.                           A few small-mouthed diverticula were found in the                            sigmoid colon and descending colon.                           Non-bleeding internal hemorrhoids were found during                            retroflexion. The hemorrhoids were small. Complications:            No immediate complications. Estimated Blood Loss:     Estimated blood loss was minimal. Impression:               - One 1 mm polyp in the transverse colon, removed                            with a cold biopsy forceps. Resected and retrieved.                           - One 4 mm polyp in the sigmoid colon, removed with                             a cold snare. Resected and retrieved.                           - Diverticulosis in the sigmoid colon and in the                            descending colon.                           - Non-bleeding internal hemorrhoids. Recommendation:           - Patient has a contact number available for                            emergencies. The signs and symptoms of potential                            delayed complications were discussed with the  patient. Return to normal activities tomorrow.                            Written discharge instructions were provided to the                            patient.                           - Resume previous diet.                           - Continue present medications.                           - Await pathology results.                           - Repeat colonoscopy in 5 years for surveillance                            based on pathology results. Mauri Pole, MD 12/17/2016 3:27:47 PM This report has been signed electronically.

## 2016-12-17 NOTE — Progress Notes (Signed)
Called to room to assist during endoscopic procedure.  Patient ID and intended procedure confirmed with present staff. Received instructions for my participation in the procedure from the performing physician.  

## 2016-12-17 NOTE — Patient Instructions (Signed)
Impressions/recommendations:  Polyps (handout given) Diverticulosis (handout given) High Fiber Diet (handout given) Hemorrhoids (handout given)  YOU HAD AN ENDOSCOPIC PROCEDURE TODAY AT [place]:   Refer to the procedure report that was given to you for any specific questions about what was found during the examination.  If the procedure report does not answer your questions, please call your gastroenterologist to clarify.  If you requested that your care partner not be given the details of your procedure findings, then the procedure report has been included in a sealed envelope for you to review at your convenience later.  YOU SHOULD EXPECT: Some feelings of bloating in the abdomen. Passage of more gas than usual.  Walking can help get rid of the air that was put into your GI tract during the procedure and reduce the bloating. If you had a lower endoscopy (such as a colonoscopy or flexible sigmoidoscopy) you may notice spotting of blood in your stool or on the toilet paper. If you underwent a bowel prep for your procedure, you may not have a normal bowel movement for a few days.  Please Note:  You might notice some irritation and congestion in your nose or some drainage.  This is from the oxygen used during your procedure.  There is no need for concern and it should clear up in a day or so.  SYMPTOMS TO REPORT IMMEDIATELY:   Following lower endoscopy (colonoscopy or flexible sigmoidoscopy):  Excessive amounts of blood in the stool  Significant tenderness or worsening of abdominal pains  Swelling of the abdomen that is new, acute  Fever of 100F or higher  For urgent or emergent issues, a gastroenterologist can be reached at any hour by calling [phone].   DIET:  We do recommend a small meal at first, but then you may proceed to your regular diet.  Drink plenty of fluids but you should avoid alcoholic beverages for 24 hours.  ACTIVITY:  You should plan to take it  easy for the rest of today and you should NOT DRIVE or use heavy machinery until tomorrow (because of the sedation medicines used during the test).    FOLLOW UP: Our staff will call the number listed on your records the next business day following your procedure to check on you and address any questions or concerns that you may have regarding the information given to you following your procedure. If we do not reach you, we will leave a message.  However, if you are feeling well and you are not experiencing any problems, there is no need to return our call.  We will assume that you have returned to your regular daily activities without incident.  If any biopsies were taken you will be contacted by phone or by letter within the next 1-3 weeks.  Please call us at [phone] if you have not heard about the biopsies in 3 weeks.    SIGNATURES/CONFIDENTIALITY: You and/or your care partner have signed paperwork which will be entered into your electronic medical record.  These signatures attest to the fact that that the information above on your After Visit Summary has been reviewed and is understood.  Full responsibility of the confidentiality of this discharge information lies with you and/or your care-partner.

## 2016-12-17 NOTE — Progress Notes (Signed)
Pt's states no medical or surgical changes since previsit or office visit. 

## 2016-12-17 NOTE — Progress Notes (Signed)
To PACU VSS, Report to RN 

## 2016-12-20 ENCOUNTER — Telehealth: Payer: Self-pay

## 2016-12-20 NOTE — Telephone Encounter (Signed)
  Follow up Call-  Call back number 12/17/2016  Post procedure Call Back phone  # [phone]  Permission to leave phone message Yes  Some recent data might be hidden     Patient questions:  Do you have a fever, pain , or abdominal swelling? No. Pain Score  0 *  Have you tolerated food without any problems? Yes.    Have you been able to return to your normal activities? Yes.    Do you have any questions about your discharge instructions: Diet   No. Medications  No. Follow up visit  No.  Do you have questions or concerns about your Care? No.  Actions: * If pain score is 4 or above: No action needed, pain <4.

## 2016-12-24 ENCOUNTER — Encounter: Payer: Self-pay | Admitting: Gastroenterology

## 2017-02-06 ENCOUNTER — Telehealth: Payer: Self-pay | Admitting: Family Medicine

## 2017-02-06 DIAGNOSIS — E7800 Pure hypercholesterolemia, unspecified: Secondary | ICD-10-CM

## 2017-02-06 NOTE — Telephone Encounter (Signed)
-----   Message from Ellamae Sia sent at 02/02/2017 11:49 AM EDT ----- Regarding: Lab orders for Tuesday, 5.8.18 Lab orders for a 3 month follow up appt.

## 2017-02-15 ENCOUNTER — Other Ambulatory Visit: Payer: Self-pay | Admitting: Family Medicine

## 2017-02-15 ENCOUNTER — Other Ambulatory Visit: Payer: Federal, State, Local not specified - PPO

## 2017-02-17 ENCOUNTER — Other Ambulatory Visit (INDEPENDENT_AMBULATORY_CARE_PROVIDER_SITE_OTHER): Payer: Federal, State, Local not specified - PPO

## 2017-02-17 DIAGNOSIS — E7800 Pure hypercholesterolemia, unspecified: Secondary | ICD-10-CM | POA: Diagnosis not present

## 2017-02-17 LAB — LIPID PANEL
Cholesterol: 202 mg/dL — ABNORMAL HIGH (ref 0–200)
HDL: 64.4 mg/dL (ref >39.00)
LDL Cholesterol: 112 mg/dL — ABNORMAL HIGH (ref 0–99)
NonHDL: 137.79
Total CHOL/HDL Ratio: 3
Triglycerides: 131 mg/dL (ref 0.0–149.0)
VLDL: 26.2 mg/dL (ref 0.0–40.0)

## 2017-04-06 DIAGNOSIS — K080 Exfoliation of teeth due to systemic causes: Secondary | ICD-10-CM | POA: Diagnosis not present

## 2017-07-01 DIAGNOSIS — J3089 Other allergic rhinitis: Secondary | ICD-10-CM | POA: Diagnosis not present

## 2017-07-01 DIAGNOSIS — J301 Allergic rhinitis due to pollen: Secondary | ICD-10-CM | POA: Diagnosis not present

## 2017-07-01 DIAGNOSIS — H1045 Other chronic allergic conjunctivitis: Secondary | ICD-10-CM | POA: Diagnosis not present

## 2017-07-01 DIAGNOSIS — J3081 Allergic rhinitis due to animal (cat) (dog) hair and dander: Secondary | ICD-10-CM | POA: Diagnosis not present

## 2017-08-08 ENCOUNTER — Other Ambulatory Visit: Payer: Self-pay | Admitting: Family Medicine

## 2017-09-08 ENCOUNTER — Encounter: Payer: Self-pay | Admitting: Radiology

## 2017-10-20 ENCOUNTER — Other Ambulatory Visit: Payer: Self-pay | Admitting: Family Medicine

## 2017-10-20 DIAGNOSIS — Z1231 Encounter for screening mammogram for malignant neoplasm of breast: Secondary | ICD-10-CM

## 2017-10-24 ENCOUNTER — Ambulatory Visit: Payer: Federal, State, Local not specified - PPO | Admitting: Obstetrics & Gynecology

## 2017-11-08 DIAGNOSIS — K080 Exfoliation of teeth due to systemic causes: Secondary | ICD-10-CM | POA: Diagnosis not present

## 2017-11-10 ENCOUNTER — Ambulatory Visit
Admission: RE | Admit: 2017-11-10 | Discharge: 2017-11-10 | Disposition: A | Discharge status: Home / Self Care | Payer: Federal, State, Local not specified - PPO | Source: Ambulatory Visit | Attending: Family Medicine | Admitting: Family Medicine

## 2017-11-10 DIAGNOSIS — Z1231 Encounter for screening mammogram for malignant neoplasm of breast: Secondary | ICD-10-CM | POA: Diagnosis not present

## 2017-11-11 ENCOUNTER — Other Ambulatory Visit: Payer: Self-pay | Admitting: Family Medicine

## 2017-11-11 DIAGNOSIS — R928 Other abnormal and inconclusive findings on diagnostic imaging of breast: Secondary | ICD-10-CM

## 2017-11-16 ENCOUNTER — Ambulatory Visit
Admission: RE | Admit: 2017-11-16 | Discharge: 2017-11-16 | Disposition: A | Discharge status: Home / Self Care | Payer: Federal, State, Local not specified - PPO | Source: Ambulatory Visit | Attending: Family Medicine | Admitting: Family Medicine

## 2017-11-16 ENCOUNTER — Other Ambulatory Visit: Payer: Self-pay | Admitting: Family Medicine

## 2017-11-16 ENCOUNTER — Ambulatory Visit: Payer: Federal, State, Local not specified - PPO

## 2017-11-16 DIAGNOSIS — R928 Other abnormal and inconclusive findings on diagnostic imaging of breast: Secondary | ICD-10-CM | POA: Diagnosis not present

## 2017-11-16 DIAGNOSIS — N6002 Solitary cyst of left breast: Secondary | ICD-10-CM | POA: Diagnosis not present

## 2017-11-16 DIAGNOSIS — N6320 Unspecified lump in the left breast, unspecified quadrant: Secondary | ICD-10-CM

## 2017-11-20 ENCOUNTER — Telehealth: Payer: Self-pay | Admitting: Family Medicine

## 2017-11-20 DIAGNOSIS — I10 Essential (primary) hypertension: Secondary | ICD-10-CM

## 2017-11-20 DIAGNOSIS — E7800 Pure hypercholesterolemia, unspecified: Secondary | ICD-10-CM

## 2017-11-20 DIAGNOSIS — Z0000 Encounter for general adult medical examination without abnormal findings: Secondary | ICD-10-CM

## 2017-11-20 NOTE — Telephone Encounter (Signed)
-----   Message from Ellamae Sia sent at 11/17/2017  4:38 PM EST ----- Regarding: Lab orders for Friday, 2.15.19 Patient is scheduled for CPX labs, please order future labs, Thanks , Karna Christmas

## 2017-11-25 ENCOUNTER — Other Ambulatory Visit (INDEPENDENT_AMBULATORY_CARE_PROVIDER_SITE_OTHER): Payer: Federal, State, Local not specified - PPO

## 2017-11-25 DIAGNOSIS — E7800 Pure hypercholesterolemia, unspecified: Secondary | ICD-10-CM | POA: Diagnosis not present

## 2017-11-25 DIAGNOSIS — I10 Essential (primary) hypertension: Secondary | ICD-10-CM | POA: Diagnosis not present

## 2017-11-25 LAB — LIPID PANEL
Cholesterol: 220 mg/dL — ABNORMAL HIGH (ref 0–200)
HDL: 66.2 mg/dL (ref >39.00)
LDL Cholesterol: 135 mg/dL — ABNORMAL HIGH (ref 0–99)
NonHDL: 153.58
Total CHOL/HDL Ratio: 3
Triglycerides: 95 mg/dL (ref 0.0–149.0)
VLDL: 19 mg/dL (ref 0.0–40.0)

## 2017-11-25 LAB — CBC WITH DIFFERENTIAL/PLATELET
Basophils Absolute: 0 10*3/uL (ref 0.0–0.1)
Basophils Relative: 0.3 % (ref 0.0–3.0)
Eosinophils Absolute: 0.1 10*3/uL (ref 0.0–0.7)
Eosinophils Relative: 2.1 % (ref 0.0–5.0)
HCT: 39.9 % (ref 36.0–46.0)
Hemoglobin: 13.4 g/dL (ref 12.0–15.0)
Lymphocytes Relative: 32.4 % (ref 12.0–46.0)
Lymphs Abs: 2.2 10*3/uL (ref 0.7–4.0)
MCHC: 33.7 g/dL (ref 30.0–36.0)
MCV: 96.3 fl (ref 78.0–100.0)
Monocytes Absolute: 0.5 10*3/uL (ref 0.1–1.0)
Monocytes Relative: 7.3 % (ref 3.0–12.0)
Neutro Abs: 3.9 10*3/uL (ref 1.4–7.7)
Neutrophils Relative %: 57.9 % (ref 43.0–77.0)
Platelets: 256 10*3/uL (ref 150.0–400.0)
RBC: 4.15 Mil/uL (ref 3.87–5.11)
RDW: 12.8 % (ref 11.5–15.5)
WBC: 6.8 10*3/uL (ref 4.0–10.5)

## 2017-11-25 LAB — COMPREHENSIVE METABOLIC PANEL
ALT: 9 U/L (ref 0–35)
AST: 21 U/L (ref 0–37)
Albumin: 4.1 g/dL (ref 3.5–5.2)
Alkaline Phosphatase: 89 U/L (ref 39–117)
BUN: 11 mg/dL (ref 6–23)
CO2: 27 mEq/L (ref 19–32)
Calcium: 9.3 mg/dL (ref 8.4–10.5)
Chloride: 102 mEq/L (ref 96–112)
Creatinine, Ser: 1.04 mg/dL (ref 0.40–1.20)
GFR: 69.73 mL/min (ref >60.00)
Glucose, Bld: 99 mg/dL (ref 70–99)
Potassium: 3.7 mEq/L (ref 3.5–5.1)
Sodium: 139 mEq/L (ref 135–145)
Total Bilirubin: 0.6 mg/dL (ref 0.2–1.2)
Total Protein: 7.8 g/dL (ref 6.0–8.3)

## 2017-11-25 LAB — TSH: TSH: 2.04 u[IU]/mL (ref 0.35–4.50)

## 2017-11-28 ENCOUNTER — Encounter: Payer: Self-pay | Admitting: Family Medicine

## 2017-11-28 ENCOUNTER — Ambulatory Visit (INDEPENDENT_AMBULATORY_CARE_PROVIDER_SITE_OTHER): Payer: Federal, State, Local not specified - PPO | Admitting: Family Medicine

## 2017-11-28 VITALS — BP 130/70 | HR 80 | Temp 97.8°F | Ht 65.0 in | Wt 183.2 lb

## 2017-11-28 DIAGNOSIS — Z23 Encounter for immunization: Secondary | ICD-10-CM

## 2017-11-28 DIAGNOSIS — Z800 Family history of malignant neoplasm of digestive organs: Secondary | ICD-10-CM | POA: Diagnosis not present

## 2017-11-28 DIAGNOSIS — E669 Obesity, unspecified: Secondary | ICD-10-CM

## 2017-11-28 DIAGNOSIS — I10 Essential (primary) hypertension: Secondary | ICD-10-CM

## 2017-11-28 DIAGNOSIS — E7800 Pure hypercholesterolemia, unspecified: Secondary | ICD-10-CM | POA: Diagnosis not present

## 2017-11-28 DIAGNOSIS — Z0000 Encounter for general adult medical examination without abnormal findings: Secondary | ICD-10-CM | POA: Diagnosis not present

## 2017-11-28 MED ORDER — HYDROCHLOROTHIAZIDE 12.5 MG PO CAPS: 12.5000 mg | ORAL_CAPSULE | Freq: Every morning | ORAL | 3 refills | Status: DC

## 2017-11-28 NOTE — Patient Instructions (Addendum)
For cholesterol: Avoid red meat/ fried foods/ egg yolks/ fatty breakfast meats/ butter, cheese and high fat dairy/ and shellfish   Fast food is bad  For overall weight loss: Try to get most of your carbohydrates from produce (with the exception of white potatoes)  Eat less bread/pasta/rice/snack foods/cereals/sweets and other items from the middle of the grocery store (processed carbs)  Eat fruits instead of sweets !  Mammogram will be due in 6 months   Flu shot today

## 2017-11-28 NOTE — Assessment & Plan Note (Signed)
Reviewed health habits including diet and exercise and skin cancer prevention Reviewed appropriate screening tests for age  Also reviewed health mt list, fam hx and immunization status , as well as social and family history   See HPI Labs rev  Plan made for wt loss and cholesterol Flu shot today  utd colonoscopy Disc grief counseling if needed after loss of father

## 2017-11-28 NOTE — Assessment & Plan Note (Signed)
bp in fair control at this time  BP Readings from Last 1 Encounters:  11/28/17 130/70   No changes needed Disc lifstyle change with low sodium diet and exercise  Labs rev -will keep working on cholesterol

## 2017-11-28 NOTE — Progress Notes (Signed)
Subjective:    Patient ID: [name], female    DOB: 09/06/1961, 56 y.o.   MRN: [id number]  HPI Here for health maintenance exam and to review chronic medical problems    It has been a tough year- lost her father (10)- urosepsis and other problems  She was his caregiver  Some grief    Wt Readings from Last 3 Encounters:  11/28/17 183 lb 4 oz (83.1 kg)  12/17/16 187 lb (84.8 kg)  12/03/16 186 lb 12.8 oz (84.7 kg)  weight is starting to come back down after eating  She exercises regularly - got a [employer] bike and loves it  30.49 kg/m   Mammogram 2/19-had cluster of cysts in L breast (recommended recall in 6 mo) after 2 views  Self breast exam no lumps   Gyn care- pap nl 1/18 (scheduled to go in March)  Cryo in the past Sees gyn  Flu shot- giving today  Tetanus shot 12/13  Colonoscopy 6/10 with 10 year recall-since then mother had colon cancer  Then had one 3/18  5 year recall/pt thinks   bp is stable today  No cp or palpitations or headaches or edema  No side effects to medicines  BP Readings from Last 3 Encounters:  11/28/17 (!) 142/80  12/17/16 (!) 154/94  11/26/16 130/78      Hyperlipidemia Lab Results  Component Value Date   CHOL 220 (H) 11/25/2017   CHOL 202 (H) 02/17/2017   CHOL 231 (H) 11/11/2016   Lab Results  Component Value Date   HDL 66.20 11/25/2017   HDL 64.40 02/17/2017   HDL 62.50 11/11/2016   Lab Results  Component Value Date   LDLCALC 135 (H) 11/25/2017   LDLCALC 112 (H) 02/17/2017   LDLCALC 140 (H) 11/11/2016   Lab Results  Component Value Date   TRIG 95.0 11/25/2017   TRIG 131.0 02/17/2017   TRIG 140.0 11/11/2016   Lab Results  Component Value Date   CHOLHDL 3 11/25/2017   CHOLHDL 3 02/17/2017   CHOLHDL 4 11/11/2016   Lab Results  Component Value Date   LDLDIRECT 128.6 10/17/2013   LDLDIRECT 128.8 10/02/2012   LDLDIRECT 129.8 02/11/2011   had prev gotten chol back down with diet-then started eating poorly again    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 Rhinorrhea 11/15/2016  . Obesity (BMI 30-39.9) 11/15/2016  . Family history of colon cancer 05/14/2015  . Travel advice encounter 01/31/2015  . Routine general medical examination at a health care facility 10/01/2012  . ESSENTIAL HYPERTENSION, BENIGN 01/29/2010  . Hyperlipidemia 10/09/2007  . OSTEOARTHRITIS, KNEE 10/09/2007   Past Medical History:  Diagnosis Date  . Abnormal finding on Pap  smear 2006   endometrial cells, biopsy ok  . Anxiety   . Fibroids    uterine  . Herpes zoster    mild case  . History of pelvic ultrasound 10/2007   uterine fibroids  . Hyperlipidemia   . Hypertension    with some white coat component   Past Surgical History:  Procedure Laterality Date  . COLONOSCOPY    . CRYOABLATION  1982   abnormal pap  . INTRAUTERINE DEVICE INSERTION  2006  . MENISCUS REPAIR Right 2002, 03/07/2015  . WISDOM TOOTH EXTRACTION     Social History   Tobacco Use  . Smoking status: Never Smoker  . Smokeless tobacco: Never Used  Substance Use Topics  . Alcohol use: No    Alcohol/week: 0.0 oz  . Drug use: No   Family History  Problem Relation Age of Onset  . Glaucoma Father   . Heart disease Mother   . Heart attack Mother        stent  . Hyperlipidemia Mother   . Colon cancer Mother 32       deceased 2014-04-02   . Hypertension Mother   . Stroke Mother   . Goiter Daughter    No Known Allergies Current Outpatient Medications on File Prior to Visit  Medication Sig Dispense Refill  . busPIRone (BUSPAR) 15 MG tablet TAKE 1 TABLET (15 MG TOTAL) BY MOUTH 2 (TWO) TIMES DAILY. 60 tablet 5  . ipratropium (ATROVENT) 0.06 % nasal spray     . Multiple Vitamin (MULTIVITAMIN) capsule Take 1 capsule by mouth daily.       No current facility-administered medications on file prior to visit.     Review of Systems  Constitutional: Negative for activity change, appetite change, fatigue, fever and unexpected weight change.  HENT: Negative for congestion, ear pain, rhinorrhea, sinus pressure and sore throat.   Eyes: Negative for pain, redness and visual disturbance.  Respiratory: Negative for cough, shortness of breath and wheezing.   Cardiovascular: Negative for chest pain and palpitations.  Gastrointestinal: Negative for abdominal pain, blood in stool, constipation and diarrhea.  Endocrine: Negative for polydipsia and polyuria.  Genitourinary: Negative for dysuria, frequency and urgency.  Musculoskeletal: Negative for arthralgias, back pain and myalgias.  Skin: Negative for pallor and rash.  Allergic/Immunologic: Negative for environmental allergies.  Neurological: Negative for dizziness, syncope and headaches.  Hematological: Negative for adenopathy. Does not bruise/bleed easily.  Psychiatric/Behavioral: Negative for decreased concentration and dysphoric mood. The patient is not nervous/anxious.        Pos for grief        Objective:   Physical Exam  Constitutional: She appears well-developed and well-nourished. No distress.  obese and well appearing   HENT:  Head: Normocephalic and atraumatic.  Right Ear: External ear normal.  Left Ear: External ear normal.  Nose: Nose normal.  Mouth/Throat: Oropharynx is clear and moist.  Eyes: Conjunctivae and EOM are normal. Pupils are equal, round, and reactive  to light. Right eye exhibits no discharge. Left eye exhibits no discharge. No scleral icterus.  Neck: Normal range of motion. Neck supple. No JVD present. Carotid bruit is not present. No thyromegaly present.  Cardiovascular: Normal rate, regular rhythm, normal heart sounds and intact distal pulses. Exam reveals no gallop.  Pulmonary/Chest: Effort normal and breath sounds normal. No respiratory distress. She has no wheezes. She has no rales.  Abdominal: Soft. Bowel sounds are normal. She exhibits no distension and no mass. There is no tenderness.  Musculoskeletal: She  exhibits no edema or tenderness.  No kyphosis  Lymphadenopathy:    She has no cervical adenopathy.  Neurological: She is alert. She has normal reflexes. No cranial nerve deficit. She exhibits normal muscle tone. Coordination normal.  Skin: Skin is warm and dry. No rash noted. No erythema. No pallor.  Few lentigines on trunk  Psychiatric: She has a normal mood and affect.          Assessment & Plan:   Problem List Items Addressed This Visit      Cardiovascular and Mediastinum   ESSENTIAL HYPERTENSION, BENIGN    bp in fair control at this time  BP Readings from Last 1 Encounters:  11/28/17 130/70   No changes needed Disc lifstyle change with low sodium diet and exercise  Labs rev -will keep working on cholesterol       Relevant Medications   hydrochlorothiazide (MICROZIDE) 12.5 MG capsule     Other   Family history of colon cancer    Pt is up to date with her colonoscopy 3/18  No c/o or stool changes       Hyperlipidemia    Disc goals for lipids and reasons to control them Rev labs with pt Rev low sat fat diet in detail LDL is back up after bad eating/ fast food while caring for father  Now back on track She has improved with diet in the past       Relevant Medications   hydrochlorothiazide (MICROZIDE) 12.5 MG capsule   Obesity (BMI 30-39.9)    Discussed how this problem influences overall health and  the risks it imposes  Reviewed plan for weight loss with lower calorie diet (via better food choices and also portion control or program like weight watchers) and exercise building up to or more than 30 minutes 5 days per week including some aerobic activity   Disc plan to cut down on sweets and processed carbs Excellent exercise and fitness!      Routine general medical examination at a health care facility - Primary    Reviewed health habits including diet and exercise and skin cancer prevention Reviewed appropriate screening tests for age  Also reviewed health mt list, fam hx and immunization status , as well as social and family history   See HPI Labs rev  Plan made for wt loss and cholesterol Flu shot today  utd colonoscopy Disc grief counseling if needed after loss of father

## 2017-11-28 NOTE — Assessment & Plan Note (Signed)
Discussed how this problem influences overall health and the risks it imposes  Reviewed plan for weight loss with lower calorie diet (via better food choices and also portion control or program like weight watchers) and exercise building up to or more than 30 minutes 5 days per week including some aerobic activity   Disc plan to cut down on sweets and processed carbs Excellent exercise and fitness!

## 2017-11-28 NOTE — Assessment & Plan Note (Signed)
Pt is up to date with her colonoscopy 3/18  No c/o or stool changes

## 2017-11-28 NOTE — Assessment & Plan Note (Signed)
Disc goals for lipids and reasons to control them Rev labs with pt Rev low sat fat diet in detail LDL is back up after bad eating/ fast food while caring for father  Now back on track She has improved with diet in the past

## 2017-12-15 ENCOUNTER — Encounter: Payer: Self-pay | Admitting: Obstetrics and Gynecology

## 2017-12-15 ENCOUNTER — Encounter (INDEPENDENT_AMBULATORY_CARE_PROVIDER_SITE_OTHER): Payer: Self-pay

## 2017-12-15 ENCOUNTER — Ambulatory Visit (INDEPENDENT_AMBULATORY_CARE_PROVIDER_SITE_OTHER): Payer: Federal, State, Local not specified - PPO | Admitting: Obstetrics and Gynecology

## 2017-12-15 VITALS — BP 124/85 | HR 72 | Wt 183.4 lb

## 2017-12-15 DIAGNOSIS — Z124 Encounter for screening for malignant neoplasm of cervix: Secondary | ICD-10-CM

## 2017-12-15 DIAGNOSIS — Z1151 Encounter for screening for human papillomavirus (HPV): Secondary | ICD-10-CM

## 2017-12-15 DIAGNOSIS — Z01419 Encounter for gynecological examination (general) (routine) without abnormal findings: Secondary | ICD-10-CM | POA: Diagnosis not present

## 2017-12-15 NOTE — Progress Notes (Signed)
Subjective:     [name] is a 57 y.o. female G2P2 postmenopausal with BMI 30.5 who is here for a comprehensive physical exam. The patient reports no problems. Patient denies any vaginal bleeding or urinary incontinence. She is not sexually active. She is still exercising regularly in the hopes of losing weight and lowering her cholesterol. She recently lost her father in January. She is coping well with the loss. She denies any pelvic pain or abnormal discharge.  Past Medical History:  Diagnosis Date  . Abnormal finding on Pap smear 2006   endometrial cells, biopsy ok  . Anxiety   . Fibroids    uterine  . Herpes zoster    mild case  . History of pelvic ultrasound 10/2007   uterine fibroids  . Hyperlipidemia   . Hypertension    with some white coat component   Past Surgical History:  Procedure Laterality Date  . COLONOSCOPY    . CRYOABLATION  1982   abnormal pap  . INTRAUTERINE DEVICE INSERTION  2006  . MENISCUS REPAIR Right 2002, 03/07/2015  . WISDOM TOOTH EXTRACTION     Family History  Problem Relation Age of Onset  . Glaucoma Father   . Heart disease Mother   . Heart attack Mother        stent  . Hyperlipidemia Mother   . Colon cancer Mother 68       deceased April 03, 2014  . Hypertension Mother   . Stroke Mother   . Goiter Daughter     Social History   Socioeconomic History  . Marital status: Single    Spouse name: Not on file  . Number of children: 2  . Years of education: Not on file  . Highest education level: Not on file  Social Needs  . Financial resource strain: Not on file  . Food insecurity - worry: Not on file  . Food insecurity - inability: Not on file  . Transportation needs - medical: Not on file  . Transportation needs - non-medical: Not on file  Occupational History  . Occupation: Aeronautical engineer: [employer]  Tobacco Use  . Smoking status: Never Smoker  . Smokeless tobacco: Never Used  Substance and Sexual Activity  . Alcohol use: No   Alcohol/week: 0.0 oz  . Drug use: No  . Sexual activity: Not Currently    Partners: Male    Birth control/protection: IUD  Other Topics Concern  . Not on file  Social History Narrative   Exercises 4 x a week, cardio and strength training   Health Maintenance  Topic Date Due  . Hepatitis C Screening  11/12/2020 (Originally Nov 23, 1960)  . HIV Screening  11/12/2020 (Originally 10/15/1975)  . MAMMOGRAM  11/16/2018  . PAP SMEAR  10/15/2019  . COLONOSCOPY  12/27/2021  . TETANUS/TDAP  10/10/2022  . INFLUENZA VACCINE  Completed       Review of Systems Pertinent items are noted in HPI.   Objective:  Blood pressure 124/85, pulse 72, weight 183 lb 6.4 oz (83.2 kg), last menstrual period 01/25/2012.     GENERAL: Well-developed, well-nourished female in no acute distress.  HEENT: Normocephalic, atraumatic. Sclerae anicteric.  NECK: Supple. Normal thyroid.  LUNGS: Clear to auscultation bilaterally.  HEART: Regular rate and rhythm. BREASTS: Symmetric in size. No palpable masses or lymphadenopathy, skin changes, or nipple drainage. ABDOMEN: Soft, nontender, nondistended. No organomegaly. PELVIC: Normal external female genitalia. Vagina is pink and rugated.  Normal discharge. Normal appearing cervix. Uterus is normal in  size. No adnexal mass or tenderness. EXTREMITIES: No cyanosis, clubbing, or edema, 2+ distal pulses.    Assessment:    Healthy female exam.      Plan:    pap smear collected Patient with mammogram and scheduled for follow up in July Patient had health maintenance labs in February Patient will be contacted with abnormal results See After Visit Summary for Counseling Recommendations

## 2017-12-15 NOTE — Progress Notes (Signed)
Mammogram 2019 Flu Shot 11/2017 Last pap 10/2016

## 2017-12-19 LAB — CYTOLOGY - PAP
Diagnosis: NEGATIVE
HPV: NOT DETECTED

## 2017-12-29 ENCOUNTER — Ambulatory Visit (INDEPENDENT_AMBULATORY_CARE_PROVIDER_SITE_OTHER): Payer: Self-pay | Admitting: Family Medicine

## 2018-03-20 DIAGNOSIS — J301 Allergic rhinitis due to pollen: Secondary | ICD-10-CM | POA: Diagnosis not present

## 2018-03-20 DIAGNOSIS — H1045 Other chronic allergic conjunctivitis: Secondary | ICD-10-CM | POA: Diagnosis not present

## 2018-03-20 DIAGNOSIS — J3081 Allergic rhinitis due to animal (cat) (dog) hair and dander: Secondary | ICD-10-CM | POA: Diagnosis not present

## 2018-03-20 DIAGNOSIS — J3089 Other allergic rhinitis: Secondary | ICD-10-CM | POA: Diagnosis not present

## 2018-04-10 DIAGNOSIS — I10 Essential (primary) hypertension: Secondary | ICD-10-CM | POA: Diagnosis not present

## 2018-04-10 DIAGNOSIS — E669 Obesity, unspecified: Secondary | ICD-10-CM | POA: Diagnosis not present

## 2018-05-08 DIAGNOSIS — I10 Essential (primary) hypertension: Secondary | ICD-10-CM | POA: Diagnosis not present

## 2018-05-08 DIAGNOSIS — E669 Obesity, unspecified: Secondary | ICD-10-CM | POA: Diagnosis not present

## 2018-05-09 DIAGNOSIS — K080 Exfoliation of teeth due to systemic causes: Secondary | ICD-10-CM | POA: Diagnosis not present

## 2018-05-17 ENCOUNTER — Other Ambulatory Visit: Payer: Self-pay | Admitting: Family Medicine

## 2018-05-17 ENCOUNTER — Ambulatory Visit
Admission: RE | Admit: 2018-05-17 | Discharge: 2018-05-17 | Disposition: A | Discharge status: Home / Self Care | Payer: Federal, State, Local not specified - PPO | Source: Ambulatory Visit | Attending: Family Medicine | Admitting: Family Medicine

## 2018-05-17 DIAGNOSIS — N6002 Solitary cyst of left breast: Secondary | ICD-10-CM | POA: Diagnosis not present

## 2018-05-17 DIAGNOSIS — N6320 Unspecified lump in the left breast, unspecified quadrant: Secondary | ICD-10-CM

## 2018-07-18 DIAGNOSIS — H40003 Preglaucoma, unspecified, bilateral: Secondary | ICD-10-CM | POA: Diagnosis not present

## 2018-10-24 ENCOUNTER — Encounter: Payer: Self-pay | Admitting: Radiology

## 2018-11-13 ENCOUNTER — Encounter: Payer: Self-pay | Admitting: Family Medicine

## 2018-11-13 DIAGNOSIS — I10 Essential (primary) hypertension: Secondary | ICD-10-CM

## 2018-11-14 DIAGNOSIS — K080 Exfoliation of teeth due to systemic causes: Secondary | ICD-10-CM | POA: Diagnosis not present

## 2018-11-14 NOTE — Telephone Encounter (Signed)
Dr. Glori Bickers please see pt's mychart question. Per Karna Christmas there is a lab order you can do to check blood type if this is something you are okay with

## 2018-11-19 ENCOUNTER — Telehealth: Payer: Self-pay | Admitting: Family Medicine

## 2018-11-19 DIAGNOSIS — E7800 Pure hypercholesterolemia, unspecified: Secondary | ICD-10-CM

## 2018-11-19 DIAGNOSIS — Z0000 Encounter for general adult medical examination without abnormal findings: Secondary | ICD-10-CM

## 2018-11-19 DIAGNOSIS — I10 Essential (primary) hypertension: Secondary | ICD-10-CM

## 2018-11-19 NOTE — Telephone Encounter (Signed)
-----   Message from Ellamae Sia sent at 11/14/2018  3:59 PM EST ----- Regarding: Lab orders for Thursday, 2.13.20 Patient is scheduled for CPX labs, please order future labs, Thanks , Karna Christmas

## 2018-11-20 ENCOUNTER — Ambulatory Visit
Admission: RE | Admit: 2018-11-20 | Discharge: 2018-11-20 | Disposition: A | Discharge status: Home / Self Care | Payer: Federal, State, Local not specified - PPO | Source: Ambulatory Visit | Attending: Family Medicine | Admitting: Family Medicine

## 2018-11-20 DIAGNOSIS — N6012 Diffuse cystic mastopathy of left breast: Secondary | ICD-10-CM | POA: Diagnosis not present

## 2018-11-20 DIAGNOSIS — N6320 Unspecified lump in the left breast, unspecified quadrant: Secondary | ICD-10-CM

## 2018-11-20 DIAGNOSIS — R928 Other abnormal and inconclusive findings on diagnostic imaging of breast: Secondary | ICD-10-CM | POA: Diagnosis not present

## 2018-11-21 NOTE — Addendum Note (Signed)
Addended by: Loura Pardon A on: 11/21/2018 08:06 AM   Modules accepted: Orders

## 2018-11-21 NOTE — Telephone Encounter (Signed)
Heads up -this patient really wants to know her blood type I ordered it with her labs for 2/13  She knows she will have to pay out of pocket - I linked to [facility] (it won't be covered) She is aware of that  Just FYI -thanks

## 2018-11-23 ENCOUNTER — Other Ambulatory Visit (INDEPENDENT_AMBULATORY_CARE_PROVIDER_SITE_OTHER): Payer: Federal, State, Local not specified - PPO

## 2018-11-23 DIAGNOSIS — I10 Essential (primary) hypertension: Secondary | ICD-10-CM

## 2018-11-23 DIAGNOSIS — E7800 Pure hypercholesterolemia, unspecified: Secondary | ICD-10-CM | POA: Diagnosis not present

## 2018-11-23 LAB — COMPREHENSIVE METABOLIC PANEL
ALT: 8 U/L (ref 0–35)
AST: 19 U/L (ref 0–37)
Albumin: 4.2 g/dL (ref 3.5–5.2)
Alkaline Phosphatase: 93 U/L (ref 39–117)
BUN: 12 mg/dL (ref 6–23)
CO2: 29 mEq/L (ref 19–32)
Calcium: 9.7 mg/dL (ref 8.4–10.5)
Chloride: 102 mEq/L (ref 96–112)
Creatinine, Ser: 1.06 mg/dL (ref 0.40–1.20)
GFR: 63.96 mL/min (ref >60.00)
Glucose, Bld: 85 mg/dL (ref 70–99)
Potassium: 3.4 mEq/L — ABNORMAL LOW (ref 3.5–5.1)
Sodium: 139 mEq/L (ref 135–145)
Total Bilirubin: 0.7 mg/dL (ref 0.2–1.2)
Total Protein: 7.7 g/dL (ref 6.0–8.3)

## 2018-11-23 LAB — CBC WITH DIFFERENTIAL/PLATELET
Basophils Absolute: 0 10*3/uL (ref 0.0–0.1)
Basophils Relative: 0.5 % (ref 0.0–3.0)
Eosinophils Absolute: 0.1 10*3/uL (ref 0.0–0.7)
Eosinophils Relative: 1.1 % (ref 0.0–5.0)
HCT: 40.1 % (ref 36.0–46.0)
Hemoglobin: 13.4 g/dL (ref 12.0–15.0)
Lymphocytes Relative: 21.3 % (ref 12.0–46.0)
Lymphs Abs: 1.5 10*3/uL (ref 0.7–4.0)
MCHC: 33.4 g/dL (ref 30.0–36.0)
MCV: 97.2 fl (ref 78.0–100.0)
Monocytes Absolute: 0.4 10*3/uL (ref 0.1–1.0)
Monocytes Relative: 5.8 % (ref 3.0–12.0)
Neutro Abs: 5.1 10*3/uL (ref 1.4–7.7)
Neutrophils Relative %: 71.3 % (ref 43.0–77.0)
Platelets: 252 10*3/uL (ref 150.0–400.0)
RBC: 4.12 Mil/uL (ref 3.87–5.11)
RDW: 12.6 % (ref 11.5–15.5)
WBC: 7.2 10*3/uL (ref 4.0–10.5)

## 2018-11-23 LAB — TSH: TSH: 2.15 u[IU]/mL (ref 0.35–4.50)

## 2018-11-23 LAB — LIPID PANEL
Cholesterol: 218 mg/dL — ABNORMAL HIGH (ref 0–200)
HDL: 75.4 mg/dL (ref >39.00)
LDL Cholesterol: 126 mg/dL — ABNORMAL HIGH (ref 0–99)
NonHDL: 142.81
Total CHOL/HDL Ratio: 3
Triglycerides: 84 mg/dL (ref 0.0–149.0)
VLDL: 16.8 mg/dL (ref 0.0–40.0)

## 2018-11-24 LAB — ABO AND RH

## 2018-11-30 ENCOUNTER — Encounter: Payer: Self-pay | Admitting: Family Medicine

## 2018-11-30 ENCOUNTER — Ambulatory Visit (INDEPENDENT_AMBULATORY_CARE_PROVIDER_SITE_OTHER): Payer: Federal, State, Local not specified - PPO | Admitting: Family Medicine

## 2018-11-30 VITALS — BP 156/92 | HR 61 | Temp 97.8°F | Ht 64.75 in | Wt 182.4 lb

## 2018-11-30 DIAGNOSIS — Z23 Encounter for immunization: Secondary | ICD-10-CM | POA: Diagnosis not present

## 2018-11-30 DIAGNOSIS — Z0000 Encounter for general adult medical examination without abnormal findings: Secondary | ICD-10-CM | POA: Diagnosis not present

## 2018-11-30 DIAGNOSIS — E669 Obesity, unspecified: Secondary | ICD-10-CM | POA: Diagnosis not present

## 2018-11-30 DIAGNOSIS — I10 Essential (primary) hypertension: Secondary | ICD-10-CM

## 2018-11-30 DIAGNOSIS — E7800 Pure hypercholesterolemia, unspecified: Secondary | ICD-10-CM

## 2018-11-30 MED ORDER — BUSPIRONE HCL 15 MG PO TABS: 15.0000 mg | ORAL_TABLET | Freq: Two times a day (BID) | ORAL | 3 refills | Status: DC

## 2018-11-30 MED ORDER — HYDROCHLOROTHIAZIDE 12.5 MG PO CAPS: 12.5000 mg | ORAL_CAPSULE | Freq: Every morning | ORAL | 3 refills | Status: DC

## 2018-11-30 NOTE — Assessment & Plan Note (Signed)
Disc goals for lipids and reasons to control them Rev last labs with pt Rev low sat fat diet in detail Would like to get LDL under 100

## 2018-11-30 NOTE — Patient Instructions (Addendum)
For weight loss Try to get most of your carbohydrates from produce (with the exception of white potatoes)  Eat less bread/pasta/rice/snack foods/cereals/sweets and other items from the middle of the grocery store (processed carbs)   Work on cutting back coffee - by one serving per week  The coffee you do drink - use less sugar/less cream   Think about going 2 weeks without sweets to see if you can control cravings   Try to get 1200-1500 mg of calcium per day with at least 1000 iu of vitamin D - for bone health   If you are interested in the new shingles vaccine (Shingrix) - call your local pharmacy to check on coverage and availability  If affordable, get on a wait list at your pharmacy or here to get the vaccine.  Set a cell phone alarm to take medicines  Start back on blood pressure medicine (your blood pressure is high)   Keep exercising

## 2018-11-30 NOTE — Assessment & Plan Note (Signed)
Reviewed health habits including diet and exercise and skin cancer prevention Reviewed appropriate screening tests for age  Also reviewed health mt list, fam hx and immunization status , as well as social and family history   See HPI Labs reviewed  Flu vaccine today  Enc her to check on coverage of shingrix  Will get back on bp medicine and f/u  Enc to reduce sweets and processed carbs in diet

## 2018-11-30 NOTE — Progress Notes (Signed)
Subjective:    Patient ID: [name], female    DOB: 1961-08-31, 58 y.o.   MRN: [id number]  HPI Here for health maintenance exam and to review chronic medical problems    Doing ok  Nothing new going on  Trying to get back to normal - after loosing parent  A little more down  Lost both parents  Also empty nest  Off her buspar-may need to start back   Wt Readings from Last 3 Encounters:  11/30/18 182 lb 6 oz (82.7 kg)  12/15/17 183 lb 6.4 oz (83.2 kg)  11/28/17 183 lb 4 oz (83.1 kg)  exercise - a lot  Eating - may have portion sizes that are too large  Cannot seem to loose weight  Struggled with sugar in diet  30.58 kg/m   Downfall is coffee- drinks a lot of it  Takes sugar in her coffee and cream (4 tsp per cup) Does not like artificial sweetener  Has not tried w/o sweetener   She does not eat pasta and rice  Is a sweet eater   Flu vaccine -given today   Mammogram 2/20 (saw b9 cluster of cysts with next mammo due 1 y)  Self breast exam   Pap 3/19  Neg with neg HPV- from gyn Peggy Constant   Colonoscopy 3/18 5 year recall for family hx of colon cancer (mother)  Tetanus shot 12/13  Zoster status -interested   Bone health - needs to take ca and D  bp is up on first check today  She has been missing her hctz  No cp or palpitations or headaches or edema  No side effects to medicines  BP Readings from Last 3 Encounters:  11/30/18 (!) 156/92  12/15/17 124/85  11/28/17 130/70      Hyperlipidemia Lab Results  Component Value Date   CHOL 218 (H) 11/23/2018   CHOL 220 (H) 11/25/2017   CHOL 202 (H) 02/17/2017   Lab Results  Component Value Date   HDL 75.40 11/23/2018   HDL 66.20 11/25/2017   HDL 64.40 02/17/2017   Lab Results  Component Value Date   LDLCALC 126 (H) 11/23/2018   LDLCALC 135 (H) 11/25/2017   LDLCALC 112 (H) 02/17/2017   Lab Results  Component Value Date   TRIG 84.0 11/23/2018   TRIG 95.0 11/25/2017   TRIG 131.0 02/17/2017    Lab Results  Component Value Date   CHOLHDL 3 11/23/2018   CHOLHDL 3 11/25/2017   CHOLHDL 3 02/17/2017   Lab Results  Component Value Date   LDLDIRECT 128.6 10/17/2013   LDLDIRECT 128.8 10/02/2012   LDLDIRECT 129.8 02/11/2011   Blood type is A positive   Patient Active Problem List   Diagnosis Date Noted  . Rhinorrhea 11/15/2016  . Obesity (BMI 30-39.9) 11/15/2016  . Family history of colon cancer 05/14/2015  . Travel advice encounter 01/31/2015  . Routine general medical examination at a health care facility 10/01/2012  . ESSENTIAL HYPERTENSION, BENIGN 01/29/2010  . Hyperlipidemia 10/09/2007  . OSTEOARTHRITIS, KNEE 10/09/2007   Past Medical History:  Diagnosis Date  . Abnormal finding on Pap smear 2006   endometrial cells, biopsy ok  . Anxiety   . Fibroids    uterine  . Herpes zoster    mild case  . History of pelvic ultrasound 10/2007   uterine fibroids  . Hyperlipidemia   . Hypertension    with some white coat component   Past Surgical History:  Procedure Laterality Date  .  COLONOSCOPY    . CRYOABLATION  1982   abnormal pap  . INTRAUTERINE DEVICE INSERTION  2006  . MENISCUS REPAIR Right 2002, 03/07/2015  . WISDOM TOOTH EXTRACTION     Social History   Tobacco Use  . Smoking status: Never Smoker  . Smokeless tobacco: Never Used  Substance Use Topics  . Alcohol use: No    Alcohol/week: 0.0 standard drinks  . Drug use: No   Family History  Problem Relation Age of Onset  . Glaucoma Father   . Heart disease Mother   . Heart attack Mother        stent  . Hyperlipidemia Mother   . Colon cancer Mother 73       deceased 03/10/2014  . Hypertension Mother   . Stroke Mother   . Goiter Daughter    No Known Allergies Current Outpatient Medications on File Prior to Visit  Medication Sig Dispense Refill  . ipratropium (ATROVENT) 0.06 % nasal spray     . Multiple Vitamin (MULTIVITAMIN) capsule Take 1 capsule by mouth daily.       No current  facility-administered medications on file prior to visit.      Review of Systems  Constitutional: Negative for activity change, appetite change, fatigue, fever and unexpected weight change.  HENT: Negative for congestion, ear pain, rhinorrhea, sinus pressure and sore throat.   Eyes: Negative for pain, redness and visual disturbance.  Respiratory: Negative for cough, shortness of breath and wheezing.   Cardiovascular: Negative for chest pain and palpitations.  Gastrointestinal: Negative for abdominal pain, blood in stool, constipation and diarrhea.  Endocrine: Negative for polydipsia and polyuria.  Genitourinary: Negative for dysuria, frequency and urgency.  Musculoskeletal: Negative for arthralgias, back pain and myalgias.  Skin: Negative for pallor and rash.  Allergic/Immunologic: Negative for environmental allergies.  Neurological: Negative for dizziness, syncope and headaches.  Hematological: Negative for adenopathy. Does not bruise/bleed easily.  Psychiatric/Behavioral: Positive for dysphoric mood. Negative for decreased concentration. The patient is nervous/anxious.        A little down/anxious and unmotivated lately  Wants to re start buspar       Objective:   Physical Exam Constitutional:      General: She is not in acute distress.    Appearance: She is well-developed. She is obese. She is not ill-appearing.  HENT:     Head: Normocephalic and atraumatic.     Right Ear: Tympanic membrane, ear canal and external ear normal.     Left Ear: Tympanic membrane, ear canal and external ear normal.     Nose: Nose normal.     Mouth/Throat:     Mouth: Mucous membranes are moist.     Pharynx: Oropharynx is clear.  Eyes:     General: No scleral icterus.    Conjunctiva/sclera: Conjunctivae normal.     Pupils: Pupils are equal, round, and reactive to light.  Neck:     Musculoskeletal: Normal range of motion and neck supple.     Thyroid: No thyromegaly.     Vascular: No carotid bruit  or JVD.  Cardiovascular:     Rate and Rhythm: Normal rate and regular rhythm.     Heart sounds: Normal heart sounds. No gallop.   Pulmonary:     Effort: Pulmonary effort is normal. No respiratory distress.     Breath sounds: Normal breath sounds. No wheezing.  Chest:     Chest wall: No tenderness.  Abdominal:     General: Bowel sounds are normal.  There is no distension or abdominal bruit.     Palpations: Abdomen is soft. There is no mass.     Tenderness: There is no abdominal tenderness.  Genitourinary:    Comments: Gyn does her breast exam  Musculoskeletal: Normal range of motion.        General: No tenderness.  Lymphadenopathy:     Cervical: No cervical adenopathy.  Skin:    General: Skin is warm and dry.     Capillary Refill: Capillary refill takes less than 2 seconds.     Coloration: Skin is not pale.     Findings: No erythema or rash.     Comments: Few brown nevi and skin tags   Neurological:     General: No focal deficit present.     Mental Status: She is alert.     Cranial Nerves: No cranial nerve deficit.     Motor: No abnormal muscle tone.     Coordination: Coordination normal.     Deep Tendon Reflexes: Reflexes are normal and symmetric. Reflexes normal.  Psychiatric:        Mood and Affect: Mood normal.           Assessment & Plan:   Problem List Items Addressed This Visit      Cardiovascular and Mediastinum   ESSENTIAL HYPERTENSION, BENIGN    bp is up off medicine BP Readings from Last 1 Encounters:  11/30/18 (!) 156/92  enc strongly to get back on hctz -px sent  F/u 2-3 wk for re check  Most recent labs reviewed  Disc lifstyle change with low sodium diet and exercise        Relevant Medications   hydrochlorothiazide (MICROZIDE) 12.5 MG capsule     Other   Hyperlipidemia    Disc goals for lipids and reasons to control them Rev last labs with pt Rev low sat fat diet in detail Would like to get LDL under 100        Relevant Medications    hydrochlorothiazide (MICROZIDE) 12.5 MG capsule   Routine general medical examination at a health care facility - Primary    Reviewed health habits including diet and exercise and skin cancer prevention Reviewed appropriate screening tests for age  Also reviewed health mt list, fam hx and immunization status , as well as social and family history   See HPI Labs reviewed  Flu vaccine today  Enc her to check on coverage of shingrix  Will get back on bp medicine and f/u  Enc to reduce sweets and processed carbs in diet       Relevant Orders   Flu Vaccine QUAD 6+ mos PF IM (Fluarix Quad PF) (Completed)   Obesity (BMI 30-39.9)    Discussed how this problem influences overall health and the risks it imposes  Reviewed plan for weight loss with lower calorie diet (via better food choices and also portion control or program like weight watchers) and exercise building up to or more than 30 minutes 5 days per week including some aerobic activity   Has addition to sugar  Disc weaning off or trying 2 wk w/o it  Enc to continue exercise        Other Visit Diagnoses    Need for influenza vaccination       Relevant Orders   Flu Vaccine QUAD 6+ mos PF IM (Fluarix Quad PF) (Completed)

## 2018-11-30 NOTE — Assessment & Plan Note (Signed)
Discussed how this problem influences overall health and the risks it imposes  Reviewed plan for weight loss with lower calorie diet (via better food choices and also portion control or program like weight watchers) and exercise building up to or more than 30 minutes 5 days per week including some aerobic activity   Has addition to sugar  Disc weaning off or trying 2 wk w/o it  Enc to continue exercise

## 2018-11-30 NOTE — Assessment & Plan Note (Signed)
bp is up off medicine BP Readings from Last 1 Encounters:  11/30/18 (!) 156/92  enc strongly to get back on hctz -px sent  F/u 2-3 wk for re check  Most recent labs reviewed  Disc lifstyle change with low sodium diet and exercise

## 2018-12-18 ENCOUNTER — Ambulatory Visit: Payer: Federal, State, Local not specified - PPO | Admitting: Family Medicine

## 2018-12-18 ENCOUNTER — Encounter: Payer: Self-pay | Admitting: Family Medicine

## 2018-12-18 VITALS — BP 162/98 | HR 81 | Temp 97.7°F | Ht 64.75 in | Wt 184.1 lb

## 2018-12-18 DIAGNOSIS — E7800 Pure hypercholesterolemia, unspecified: Secondary | ICD-10-CM | POA: Diagnosis not present

## 2018-12-18 DIAGNOSIS — I10 Essential (primary) hypertension: Secondary | ICD-10-CM

## 2018-12-18 MED ORDER — LISINOPRIL 10 MG PO TABS: 10.0000 mg | ORAL_TABLET | Freq: Every day | ORAL | 11 refills | Status: DC

## 2018-12-18 NOTE — Progress Notes (Signed)
Subjective:    Patient ID: [name], female    DOB: 05-01-1961, 58 y.o.   MRN: [id number]  HPI  Here for HTN follow up   Wt Readings from Last 3 Encounters:  12/18/18 184 lb 1 oz (83.5 kg)  11/30/18 182 lb 6 oz (82.7 kg)  12/15/17 183 lb 6.4 oz (83.2 kg)   30.87 kg/m    Last visit for HTN was 2/20 Enc strongly for her to get back on hctz and px was sent  Lifestyle change discussed  BP Readings from Last 3 Encounters:  12/18/18 (!) 184/102  11/30/18 (!) 156/92  12/15/17 124/85   At home 130s /80s generally  Is nervous today   162/98 on 2nd check R arm with regular cuff size  Her cuff 169/105    No headache from bp  occ sinus ha  No cp or leg swelling   Exercise - weight lifting and boot camp  Working on healthy diet also  Has cut out a lot of sweet stuff      Lab Results  Component Value Date   CREATININE 1.06 11/23/2018   BUN 12 11/23/2018   NA 139 11/23/2018   K 3.4 (L) 11/23/2018   CL 102 11/23/2018   CO2 29 11/23/2018   Lab Results  Component Value Date   CHOL 218 (H) 11/23/2018   HDL 75.40 11/23/2018   LDLCALC 126 (H) 11/23/2018   LDLDIRECT 128.6 10/17/2013   TRIG 84.0 11/23/2018   CHOLHDL 3 11/23/2018   Not a lot of fat in diet / a lot of baked goods and donuts  Is stopping fast foods   Patient Active Problem List   Diagnosis Date Noted  . Rhinorrhea 11/15/2016  . Obesity (BMI 30-39.9) 11/15/2016  . Family history of colon cancer 05/14/2015  . Travel advice encounter 01/31/2015  . Routine general medical examination at a health care facility 10/01/2012  . ESSENTIAL HYPERTENSION, BENIGN 01/29/2010  . Hyperlipidemia 10/09/2007  . OSTEOARTHRITIS, KNEE 10/09/2007   Past Medical History:  Diagnosis Date  . Abnormal finding on Pap smear 2006   endometrial cells, biopsy ok  . Anxiety   . Fibroids    uterine  . Herpes zoster    mild case  . History of pelvic ultrasound 10/2007   uterine fibroids  . Hyperlipidemia   .  Hypertension    with some white coat component   Past Surgical History:  Procedure Laterality Date  . COLONOSCOPY    . CRYOABLATION  1982   abnormal pap  . INTRAUTERINE DEVICE INSERTION  2006  . MENISCUS REPAIR Right 2002, 03/07/2015  . WISDOM TOOTH EXTRACTION     Social History   Tobacco Use  . Smoking status: Never Smoker  . Smokeless tobacco: Never Used  Substance Use Topics  . Alcohol use: No    Alcohol/week: 0.0 standard drinks  . Drug use: No   Family History  Problem Relation Age of Onset  . Glaucoma Father   . Heart disease Mother   . Heart attack Mother        stent  . Hyperlipidemia Mother   . Colon cancer Mother 71       deceased 2014/03/13  . Hypertension Mother   . Stroke Mother   . Goiter Daughter    No Known Allergies Current Outpatient Medications on File Prior to Visit  Medication Sig Dispense Refill  . busPIRone (BUSPAR) 15 MG tablet Take 1 tablet (15 mg total) by mouth 2 (two)  times daily. 180 tablet 3  . hydrochlorothiazide (MICROZIDE) 12.5 MG capsule Take 1 capsule (12.5 mg total) by mouth every morning. 90 capsule 3  . ipratropium (ATROVENT) 0.06 % nasal spray     . Multiple Vitamin (MULTIVITAMIN) capsule Take 1 capsule by mouth daily.       No current facility-administered medications on file prior to visit.     Review of Systems  Constitutional: Negative for activity change, appetite change, fatigue, fever and unexpected weight change.  HENT: Negative for congestion, ear pain, rhinorrhea, sinus pressure and sore throat.   Eyes: Negative for pain, redness and visual disturbance.  Respiratory: Negative for cough, shortness of breath and wheezing.   Cardiovascular: Negative for chest pain and palpitations.  Gastrointestinal: Negative for abdominal pain, blood in stool, constipation and diarrhea.  Endocrine: Negative for polydipsia and polyuria.  Genitourinary: Negative for dysuria, frequency and urgency.  Musculoskeletal: Negative for arthralgias,  back pain and myalgias.  Skin: Negative for pallor and rash.  Allergic/Immunologic: Negative for environmental allergies.  Neurological: Negative for dizziness, syncope and headaches.  Hematological: Negative for adenopathy. Does not bruise/bleed easily.  Psychiatric/Behavioral: Negative for decreased concentration and dysphoric mood. The patient is not nervous/anxious.        Objective:   Physical Exam Constitutional:      General: She is not in acute distress.    Appearance: Normal appearance. She is well-developed. She is obese. She is not ill-appearing.  HENT:     Head: Normocephalic and atraumatic.  Eyes:     Conjunctiva/sclera: Conjunctivae normal.     Pupils: Pupils are equal, round, and reactive to light.  Neck:     Musculoskeletal: Normal range of motion and neck supple.     Thyroid: No thyromegaly.     Vascular: No carotid bruit or JVD.  Cardiovascular:     Rate and Rhythm: Normal rate and regular rhythm.     Heart sounds: Normal heart sounds. No gallop.   Pulmonary:     Effort: Pulmonary effort is normal. No respiratory distress.     Breath sounds: Normal breath sounds. No wheezing or rales.  Abdominal:     General: Bowel sounds are normal. There is no distension or abdominal bruit.     Palpations: Abdomen is soft. There is no mass.     Tenderness: There is no abdominal tenderness.  Lymphadenopathy:     Cervical: No cervical adenopathy.  Skin:    General: Skin is warm and dry.     Findings: No rash.  Neurological:     Mental Status: She is alert.     Deep Tendon Reflexes: Reflexes are normal and symmetric.           Assessment & Plan:   Problem List Items Addressed This Visit      Cardiovascular and Mediastinum   ESSENTIAL HYPERTENSION, BENIGN - Primary    bp continues to rise despite good lifestyle change effort  Will add lisinopril 10 mg daily  Disc poss side eff incl cough Continue hctz  F/u about a month Continue good habits         Relevant Medications   lisinopril (PRINIVIL,ZESTRIL) 10 MG tablet     Other   Hyperlipidemia    Working on diet  Disc goals for lipids and reasons to control them Rev last labs with pt Rev low sat fat diet in detail Will plan to re check in may      Relevant Medications   lisinopril (PRINIVIL,ZESTRIL) 10 MG  tablet

## 2018-12-18 NOTE — Assessment & Plan Note (Signed)
Working on diet  Disc goals for lipids and reasons to control them Rev last labs with pt Rev low sat fat diet in detail Will plan to re check in may

## 2018-12-18 NOTE — Patient Instructions (Addendum)
Schedule fasting labs in may on the way out   For cholesterol  Avoid red meat/ fried foods/ egg yolks/ fatty breakfast meats/ butter, cheese and high fat dairy/ and shellfish    Blood pressure is up despite your great efforts  This is hereditary for you  Keep working on exercise and diet   Add lisinopril 10 mg daily  If you develop any side effects like cough or low blood pressure (90/50 or lower) let us know   Follow up in 1 month

## 2018-12-18 NOTE — Assessment & Plan Note (Signed)
bp continues to rise despite good lifestyle change effort  Will add lisinopril 10 mg daily  Disc poss side eff incl cough Continue hctz  F/u about a month Continue good habits

## 2018-12-27 ENCOUNTER — Ambulatory Visit: Payer: Federal, State, Local not specified - PPO | Admitting: Obstetrics and Gynecology

## 2019-01-22 ENCOUNTER — Encounter: Payer: Self-pay | Admitting: Family Medicine

## 2019-01-22 ENCOUNTER — Ambulatory Visit (INDEPENDENT_AMBULATORY_CARE_PROVIDER_SITE_OTHER): Payer: Federal, State, Local not specified - PPO | Admitting: Family Medicine

## 2019-01-22 VITALS — BP 120/81 | Wt 184.0 lb

## 2019-01-22 DIAGNOSIS — I10 Essential (primary) hypertension: Secondary | ICD-10-CM | POA: Diagnosis not present

## 2019-01-22 NOTE — Assessment & Plan Note (Signed)
Improved with addn of lisinopril  Continues hctz Eating high K foods  bp in fair control at this time  BP Readings from Last 1 Encounters:  01/22/19 120/81   No changes needed Most recent labs reviewed  Disc lifstyle change with low sodium diet and exercise

## 2019-01-22 NOTE — Progress Notes (Signed)
Virtual Visit via Video Note  I connected with [name] on 01/22/19 at  8:00 AM EDT by a video enabled telemedicine application and verified that I am speaking with the correct person using two identifiers. The patient is at home today  I am in my office    I discussed the limitations of evaluation and management by telemedicine and the availability of in person appointments. The patient expressed understanding and agreed to proceed.  History of Present Illness: Follow up for HTN  Last visit her bp continued to stay high despite good lifestyle change   Working from home   We added lisinopril 10 mg daily and disc poss side eff She continues hctz   She checks her bp at home  111-120/ 80s  (120/81 latest) No pulse changes-normal  No side effects from the lisinopril   Wt Readings from Last 3 Encounters:  12/18/18 184 lb 1 oz (83.5 kg)  11/30/18 182 lb 6 oz (82.7 kg)  12/15/17 183 lb 6.4 oz (83.2 kg)     buspar for mood -doing well   Weight = is about the same - struggling with not snacking in the house Ate fast food once She has exercise equipment -has a peleton bike also  Walking regularly also  Lab Results  Component Value Date   CREATININE 1.06 11/23/2018   BUN 12 11/23/2018   NA 139 11/23/2018   K 3.4 (L) 11/23/2018   CL 102 11/23/2018   CO2 29 11/23/2018   Review of Systems  Constitutional: Negative for chills, fever, malaise/fatigue and weight loss.  Eyes: Negative for blurred vision.  Respiratory: Negative for cough and shortness of breath.   Cardiovascular: Negative for chest pain, palpitations and leg swelling.  Gastrointestinal: Negative for nausea.  Skin: Negative for rash.  Neurological: Negative for dizziness and headaches.  Psychiatric/Behavioral:       Mood is ok      Patient Active Problem List   Diagnosis Date Noted  . Rhinorrhea 11/15/2016  . Obesity (BMI 30-39.9) 11/15/2016  . Family history of colon cancer 05/14/2015  . Travel advice  encounter 01/31/2015  . Routine general medical examination at a health care facility 10/01/2012  . ESSENTIAL HYPERTENSION, BENIGN 01/29/2010  . Hyperlipidemia 10/09/2007  . OSTEOARTHRITIS, KNEE 10/09/2007   Past Medical History:  Diagnosis Date  . Abnormal finding on Pap smear 2006   endometrial cells, biopsy ok  . Anxiety   . Fibroids    uterine  . Herpes zoster    mild case  . History of pelvic ultrasound 10/2007   uterine fibroids  . Hyperlipidemia   . Hypertension    with some white coat component   Past Surgical History:  Procedure Laterality Date  . COLONOSCOPY    . CRYOABLATION  1982   abnormal pap  . INTRAUTERINE DEVICE INSERTION  2006  . MENISCUS REPAIR Right 2002, 03/07/2015  . WISDOM TOOTH EXTRACTION     Social History   Tobacco Use  . Smoking status: Never Smoker  . Smokeless tobacco: Never Used  Substance Use Topics  . Alcohol use: No    Alcohol/week: 0.0 standard drinks  . Drug use: No   Family History  Problem Relation Age of Onset  . Glaucoma Father   . Heart disease Mother   . Heart attack Mother        stent  . Hyperlipidemia Mother   . Colon cancer Mother 5       deceased 04/03/14  . Hypertension  Mother   . Stroke Mother   . Goiter Daughter    No Known Allergies Current Outpatient Medications on File Prior to Visit  Medication Sig Dispense Refill  . busPIRone (BUSPAR) 15 MG tablet Take 1 tablet (15 mg total) by mouth 2 (two) times daily. 180 tablet 3  . hydrochlorothiazide (MICROZIDE) 12.5 MG capsule Take 1 capsule (12.5 mg total) by mouth every morning. 90 capsule 3  . ipratropium (ATROVENT) 0.06 % nasal spray     . lisinopril (PRINIVIL,ZESTRIL) 10 MG tablet Take 1 tablet (10 mg total) by mouth daily. 30 tablet 11  . Multiple Vitamin (MULTIVITAMIN) capsule Take 1 capsule by mouth daily.       No current facility-administered medications on file prior to visit.     Observations/Objective: Pt looks well in her home today  No apparent  weight change No facial swelling or eye swelling  Talkative/mood is good and answers questions appropriately  No rash or acute skin change  No cough  Voice is not hoars   Assessment and Plan: Problem List Items Addressed This Visit      Cardiovascular and Mediastinum   ESSENTIAL HYPERTENSION, BENIGN - Primary    Improved with addn of lisinopril  Continues hctz Eating high K foods  bp in fair control at this time  BP Readings from Last 1 Encounters:  01/22/19 120/81   No changes needed Most recent labs reviewed  Disc lifstyle change with low sodium diet and exercise            Follow Up Instructions: Continue current medications BP is better- monitor 1-2 times per week  Keep exercising Try and avoid junk food/ processed carbs Also eat high potassium foods     I discussed the assessment and treatment plan with the patient. The patient was provided an opportunity to ask questions and all were answered. The patient agreed with the plan and demonstrated an understanding of the instructions.   The patient was advised to call back or seek an in-person evaluation if the symptoms worsen or if the condition fails to improve as anticipated.    Loura Pardon, MD

## 2019-01-23 ENCOUNTER — Ambulatory Visit: Payer: Federal, State, Local not specified - PPO | Admitting: Obstetrics and Gynecology

## 2019-02-22 ENCOUNTER — Other Ambulatory Visit (INDEPENDENT_AMBULATORY_CARE_PROVIDER_SITE_OTHER): Payer: Federal, State, Local not specified - PPO

## 2019-02-22 ENCOUNTER — Other Ambulatory Visit: Payer: Federal, State, Local not specified - PPO

## 2019-02-22 ENCOUNTER — Other Ambulatory Visit: Payer: Self-pay

## 2019-02-22 DIAGNOSIS — E7800 Pure hypercholesterolemia, unspecified: Secondary | ICD-10-CM | POA: Diagnosis not present

## 2019-02-22 DIAGNOSIS — I10 Essential (primary) hypertension: Secondary | ICD-10-CM

## 2019-02-22 LAB — COMPREHENSIVE METABOLIC PANEL WITH GFR
ALT: 8 U/L (ref 0–35)
AST: 20 U/L (ref 0–37)
Albumin: 4 g/dL (ref 3.5–5.2)
Alkaline Phosphatase: 102 U/L (ref 39–117)
BUN: 12 mg/dL (ref 6–23)
CO2: 32 meq/L (ref 19–32)
Calcium: 9.4 mg/dL (ref 8.4–10.5)
Chloride: 101 meq/L (ref 96–112)
Creatinine, Ser: 1.02 mg/dL (ref 0.40–1.20)
GFR: 66.81 mL/min
Glucose, Bld: 93 mg/dL (ref 70–99)
Potassium: 3.6 meq/L (ref 3.5–5.1)
Sodium: 139 meq/L (ref 135–145)
Total Bilirubin: 0.6 mg/dL (ref 0.2–1.2)
Total Protein: 7.5 g/dL (ref 6.0–8.3)

## 2019-02-22 LAB — LIPID PANEL
Cholesterol: 213 mg/dL — ABNORMAL HIGH (ref 0–200)
HDL: 64.4 mg/dL (ref >39.00)
LDL Cholesterol: 129 mg/dL — ABNORMAL HIGH (ref 0–99)
NonHDL: 148.26
Total CHOL/HDL Ratio: 3
Triglycerides: 94 mg/dL (ref 0.0–149.0)
VLDL: 18.8 mg/dL (ref 0.0–40.0)

## 2019-03-12 DIAGNOSIS — J3081 Allergic rhinitis due to animal (cat) (dog) hair and dander: Secondary | ICD-10-CM | POA: Diagnosis not present

## 2019-03-12 DIAGNOSIS — H1045 Other chronic allergic conjunctivitis: Secondary | ICD-10-CM | POA: Diagnosis not present

## 2019-03-12 DIAGNOSIS — J301 Allergic rhinitis due to pollen: Secondary | ICD-10-CM | POA: Diagnosis not present

## 2019-03-12 DIAGNOSIS — J3089 Other allergic rhinitis: Secondary | ICD-10-CM | POA: Diagnosis not present

## 2019-04-03 DIAGNOSIS — J301 Allergic rhinitis due to pollen: Secondary | ICD-10-CM | POA: Diagnosis not present

## 2019-04-04 DIAGNOSIS — J3081 Allergic rhinitis due to animal (cat) (dog) hair and dander: Secondary | ICD-10-CM | POA: Diagnosis not present

## 2019-04-04 DIAGNOSIS — J3089 Other allergic rhinitis: Secondary | ICD-10-CM | POA: Diagnosis not present

## 2019-04-09 DIAGNOSIS — J301 Allergic rhinitis due to pollen: Secondary | ICD-10-CM | POA: Diagnosis not present

## 2019-04-09 DIAGNOSIS — J3089 Other allergic rhinitis: Secondary | ICD-10-CM | POA: Diagnosis not present

## 2019-04-09 DIAGNOSIS — J3081 Allergic rhinitis due to animal (cat) (dog) hair and dander: Secondary | ICD-10-CM | POA: Diagnosis not present

## 2019-04-12 DIAGNOSIS — J3081 Allergic rhinitis due to animal (cat) (dog) hair and dander: Secondary | ICD-10-CM | POA: Diagnosis not present

## 2019-04-12 DIAGNOSIS — J301 Allergic rhinitis due to pollen: Secondary | ICD-10-CM | POA: Diagnosis not present

## 2019-04-12 DIAGNOSIS — J3089 Other allergic rhinitis: Secondary | ICD-10-CM | POA: Diagnosis not present

## 2019-04-16 DIAGNOSIS — J3081 Allergic rhinitis due to animal (cat) (dog) hair and dander: Secondary | ICD-10-CM | POA: Diagnosis not present

## 2019-04-16 DIAGNOSIS — J301 Allergic rhinitis due to pollen: Secondary | ICD-10-CM | POA: Diagnosis not present

## 2019-04-16 DIAGNOSIS — J3089 Other allergic rhinitis: Secondary | ICD-10-CM | POA: Diagnosis not present

## 2019-04-19 DIAGNOSIS — J3089 Other allergic rhinitis: Secondary | ICD-10-CM | POA: Diagnosis not present

## 2019-04-19 DIAGNOSIS — J301 Allergic rhinitis due to pollen: Secondary | ICD-10-CM | POA: Diagnosis not present

## 2019-04-19 DIAGNOSIS — J3081 Allergic rhinitis due to animal (cat) (dog) hair and dander: Secondary | ICD-10-CM | POA: Diagnosis not present

## 2019-04-23 DIAGNOSIS — J301 Allergic rhinitis due to pollen: Secondary | ICD-10-CM | POA: Diagnosis not present

## 2019-04-23 DIAGNOSIS — J3089 Other allergic rhinitis: Secondary | ICD-10-CM | POA: Diagnosis not present

## 2019-04-23 DIAGNOSIS — J3081 Allergic rhinitis due to animal (cat) (dog) hair and dander: Secondary | ICD-10-CM | POA: Diagnosis not present

## 2019-04-26 DIAGNOSIS — J3081 Allergic rhinitis due to animal (cat) (dog) hair and dander: Secondary | ICD-10-CM | POA: Diagnosis not present

## 2019-04-26 DIAGNOSIS — J301 Allergic rhinitis due to pollen: Secondary | ICD-10-CM | POA: Diagnosis not present

## 2019-04-26 DIAGNOSIS — J3089 Other allergic rhinitis: Secondary | ICD-10-CM | POA: Diagnosis not present

## 2019-04-30 DIAGNOSIS — J3089 Other allergic rhinitis: Secondary | ICD-10-CM | POA: Diagnosis not present

## 2019-04-30 DIAGNOSIS — J301 Allergic rhinitis due to pollen: Secondary | ICD-10-CM | POA: Diagnosis not present

## 2019-04-30 DIAGNOSIS — J3081 Allergic rhinitis due to animal (cat) (dog) hair and dander: Secondary | ICD-10-CM | POA: Diagnosis not present

## 2019-05-03 DIAGNOSIS — J301 Allergic rhinitis due to pollen: Secondary | ICD-10-CM | POA: Diagnosis not present

## 2019-05-03 DIAGNOSIS — J3089 Other allergic rhinitis: Secondary | ICD-10-CM | POA: Diagnosis not present

## 2019-05-03 DIAGNOSIS — J3081 Allergic rhinitis due to animal (cat) (dog) hair and dander: Secondary | ICD-10-CM | POA: Diagnosis not present

## 2019-05-07 DIAGNOSIS — J3081 Allergic rhinitis due to animal (cat) (dog) hair and dander: Secondary | ICD-10-CM | POA: Diagnosis not present

## 2019-05-07 DIAGNOSIS — J301 Allergic rhinitis due to pollen: Secondary | ICD-10-CM | POA: Diagnosis not present

## 2019-05-07 DIAGNOSIS — J3089 Other allergic rhinitis: Secondary | ICD-10-CM | POA: Diagnosis not present

## 2019-05-10 DIAGNOSIS — J301 Allergic rhinitis due to pollen: Secondary | ICD-10-CM | POA: Diagnosis not present

## 2019-05-10 DIAGNOSIS — J3089 Other allergic rhinitis: Secondary | ICD-10-CM | POA: Diagnosis not present

## 2019-05-10 DIAGNOSIS — J3081 Allergic rhinitis due to animal (cat) (dog) hair and dander: Secondary | ICD-10-CM | POA: Diagnosis not present

## 2019-05-14 DIAGNOSIS — J3081 Allergic rhinitis due to animal (cat) (dog) hair and dander: Secondary | ICD-10-CM | POA: Diagnosis not present

## 2019-05-14 DIAGNOSIS — J301 Allergic rhinitis due to pollen: Secondary | ICD-10-CM | POA: Diagnosis not present

## 2019-05-14 DIAGNOSIS — J3089 Other allergic rhinitis: Secondary | ICD-10-CM | POA: Diagnosis not present

## 2019-05-17 DIAGNOSIS — J301 Allergic rhinitis due to pollen: Secondary | ICD-10-CM | POA: Diagnosis not present

## 2019-05-17 DIAGNOSIS — J3089 Other allergic rhinitis: Secondary | ICD-10-CM | POA: Diagnosis not present

## 2019-05-17 DIAGNOSIS — J3081 Allergic rhinitis due to animal (cat) (dog) hair and dander: Secondary | ICD-10-CM | POA: Diagnosis not present

## 2019-05-21 DIAGNOSIS — J3089 Other allergic rhinitis: Secondary | ICD-10-CM | POA: Diagnosis not present

## 2019-05-21 DIAGNOSIS — J301 Allergic rhinitis due to pollen: Secondary | ICD-10-CM | POA: Diagnosis not present

## 2019-05-21 DIAGNOSIS — J3081 Allergic rhinitis due to animal (cat) (dog) hair and dander: Secondary | ICD-10-CM | POA: Diagnosis not present

## 2019-05-24 DIAGNOSIS — J3089 Other allergic rhinitis: Secondary | ICD-10-CM | POA: Diagnosis not present

## 2019-05-24 DIAGNOSIS — J3081 Allergic rhinitis due to animal (cat) (dog) hair and dander: Secondary | ICD-10-CM | POA: Diagnosis not present

## 2019-05-24 DIAGNOSIS — J301 Allergic rhinitis due to pollen: Secondary | ICD-10-CM | POA: Diagnosis not present

## 2019-05-28 DIAGNOSIS — J3089 Other allergic rhinitis: Secondary | ICD-10-CM | POA: Diagnosis not present

## 2019-05-28 DIAGNOSIS — J3081 Allergic rhinitis due to animal (cat) (dog) hair and dander: Secondary | ICD-10-CM | POA: Diagnosis not present

## 2019-05-28 DIAGNOSIS — J301 Allergic rhinitis due to pollen: Secondary | ICD-10-CM | POA: Diagnosis not present

## 2019-06-01 DIAGNOSIS — J3089 Other allergic rhinitis: Secondary | ICD-10-CM | POA: Diagnosis not present

## 2019-06-01 DIAGNOSIS — J3081 Allergic rhinitis due to animal (cat) (dog) hair and dander: Secondary | ICD-10-CM | POA: Diagnosis not present

## 2019-06-01 DIAGNOSIS — J301 Allergic rhinitis due to pollen: Secondary | ICD-10-CM | POA: Diagnosis not present

## 2019-06-05 DIAGNOSIS — J3081 Allergic rhinitis due to animal (cat) (dog) hair and dander: Secondary | ICD-10-CM | POA: Diagnosis not present

## 2019-06-05 DIAGNOSIS — J301 Allergic rhinitis due to pollen: Secondary | ICD-10-CM | POA: Diagnosis not present

## 2019-06-05 DIAGNOSIS — J3089 Other allergic rhinitis: Secondary | ICD-10-CM | POA: Diagnosis not present

## 2019-06-07 DIAGNOSIS — J301 Allergic rhinitis due to pollen: Secondary | ICD-10-CM | POA: Diagnosis not present

## 2019-06-07 DIAGNOSIS — J3089 Other allergic rhinitis: Secondary | ICD-10-CM | POA: Diagnosis not present

## 2019-06-07 DIAGNOSIS — J3081 Allergic rhinitis due to animal (cat) (dog) hair and dander: Secondary | ICD-10-CM | POA: Diagnosis not present

## 2019-06-11 DIAGNOSIS — J301 Allergic rhinitis due to pollen: Secondary | ICD-10-CM | POA: Diagnosis not present

## 2019-06-11 DIAGNOSIS — K080 Exfoliation of teeth due to systemic causes: Secondary | ICD-10-CM | POA: Diagnosis not present

## 2019-06-11 DIAGNOSIS — J3081 Allergic rhinitis due to animal (cat) (dog) hair and dander: Secondary | ICD-10-CM | POA: Diagnosis not present

## 2019-06-11 DIAGNOSIS — J3089 Other allergic rhinitis: Secondary | ICD-10-CM | POA: Diagnosis not present

## 2019-06-15 DIAGNOSIS — J301 Allergic rhinitis due to pollen: Secondary | ICD-10-CM | POA: Diagnosis not present

## 2019-06-15 DIAGNOSIS — J3089 Other allergic rhinitis: Secondary | ICD-10-CM | POA: Diagnosis not present

## 2019-06-15 DIAGNOSIS — J3081 Allergic rhinitis due to animal (cat) (dog) hair and dander: Secondary | ICD-10-CM | POA: Diagnosis not present

## 2019-06-19 DIAGNOSIS — J301 Allergic rhinitis due to pollen: Secondary | ICD-10-CM | POA: Diagnosis not present

## 2019-06-19 DIAGNOSIS — J3081 Allergic rhinitis due to animal (cat) (dog) hair and dander: Secondary | ICD-10-CM | POA: Diagnosis not present

## 2019-06-19 DIAGNOSIS — J3089 Other allergic rhinitis: Secondary | ICD-10-CM | POA: Diagnosis not present

## 2019-06-22 DIAGNOSIS — J3089 Other allergic rhinitis: Secondary | ICD-10-CM | POA: Diagnosis not present

## 2019-06-22 DIAGNOSIS — J3081 Allergic rhinitis due to animal (cat) (dog) hair and dander: Secondary | ICD-10-CM | POA: Diagnosis not present

## 2019-06-22 DIAGNOSIS — J301 Allergic rhinitis due to pollen: Secondary | ICD-10-CM | POA: Diagnosis not present

## 2019-06-26 DIAGNOSIS — J3081 Allergic rhinitis due to animal (cat) (dog) hair and dander: Secondary | ICD-10-CM | POA: Diagnosis not present

## 2019-06-26 DIAGNOSIS — J301 Allergic rhinitis due to pollen: Secondary | ICD-10-CM | POA: Diagnosis not present

## 2019-06-26 DIAGNOSIS — J3089 Other allergic rhinitis: Secondary | ICD-10-CM | POA: Diagnosis not present

## 2019-07-03 DIAGNOSIS — J3089 Other allergic rhinitis: Secondary | ICD-10-CM | POA: Diagnosis not present

## 2019-07-03 DIAGNOSIS — J301 Allergic rhinitis due to pollen: Secondary | ICD-10-CM | POA: Diagnosis not present

## 2019-07-03 DIAGNOSIS — J3081 Allergic rhinitis due to animal (cat) (dog) hair and dander: Secondary | ICD-10-CM | POA: Diagnosis not present

## 2019-07-10 DIAGNOSIS — J3081 Allergic rhinitis due to animal (cat) (dog) hair and dander: Secondary | ICD-10-CM | POA: Diagnosis not present

## 2019-07-10 DIAGNOSIS — J3089 Other allergic rhinitis: Secondary | ICD-10-CM | POA: Diagnosis not present

## 2019-07-10 DIAGNOSIS — J301 Allergic rhinitis due to pollen: Secondary | ICD-10-CM | POA: Diagnosis not present

## 2019-07-12 DIAGNOSIS — Z713 Dietary counseling and surveillance: Secondary | ICD-10-CM | POA: Diagnosis not present

## 2019-07-13 ENCOUNTER — Ambulatory Visit (INDEPENDENT_AMBULATORY_CARE_PROVIDER_SITE_OTHER): Payer: Federal, State, Local not specified - PPO

## 2019-07-13 DIAGNOSIS — Z23 Encounter for immunization: Secondary | ICD-10-CM | POA: Diagnosis not present

## 2019-07-16 DIAGNOSIS — J3089 Other allergic rhinitis: Secondary | ICD-10-CM | POA: Diagnosis not present

## 2019-07-16 DIAGNOSIS — J3081 Allergic rhinitis due to animal (cat) (dog) hair and dander: Secondary | ICD-10-CM | POA: Diagnosis not present

## 2019-07-16 DIAGNOSIS — J301 Allergic rhinitis due to pollen: Secondary | ICD-10-CM | POA: Diagnosis not present

## 2019-07-23 DIAGNOSIS — J3089 Other allergic rhinitis: Secondary | ICD-10-CM | POA: Diagnosis not present

## 2019-07-23 DIAGNOSIS — J301 Allergic rhinitis due to pollen: Secondary | ICD-10-CM | POA: Diagnosis not present

## 2019-07-23 DIAGNOSIS — J3081 Allergic rhinitis due to animal (cat) (dog) hair and dander: Secondary | ICD-10-CM | POA: Diagnosis not present

## 2019-07-25 DIAGNOSIS — J301 Allergic rhinitis due to pollen: Secondary | ICD-10-CM | POA: Diagnosis not present

## 2019-07-26 DIAGNOSIS — J3081 Allergic rhinitis due to animal (cat) (dog) hair and dander: Secondary | ICD-10-CM | POA: Diagnosis not present

## 2019-07-26 DIAGNOSIS — J3089 Other allergic rhinitis: Secondary | ICD-10-CM | POA: Diagnosis not present

## 2019-07-30 DIAGNOSIS — J3089 Other allergic rhinitis: Secondary | ICD-10-CM | POA: Diagnosis not present

## 2019-07-30 DIAGNOSIS — J3081 Allergic rhinitis due to animal (cat) (dog) hair and dander: Secondary | ICD-10-CM | POA: Diagnosis not present

## 2019-07-30 DIAGNOSIS — J301 Allergic rhinitis due to pollen: Secondary | ICD-10-CM | POA: Diagnosis not present

## 2019-08-01 DIAGNOSIS — H40003 Preglaucoma, unspecified, bilateral: Secondary | ICD-10-CM | POA: Diagnosis not present

## 2019-08-02 ENCOUNTER — Ambulatory Visit (INDEPENDENT_AMBULATORY_CARE_PROVIDER_SITE_OTHER): Payer: Federal, State, Local not specified - PPO | Admitting: Family Medicine

## 2019-08-02 ENCOUNTER — Encounter: Payer: Self-pay | Admitting: Family Medicine

## 2019-08-02 VITALS — BP 115/69 | HR 78 | Temp 98.0°F | Wt 174.0 lb

## 2019-08-02 DIAGNOSIS — R0982 Postnasal drip: Secondary | ICD-10-CM | POA: Diagnosis not present

## 2019-08-02 DIAGNOSIS — I10 Essential (primary) hypertension: Secondary | ICD-10-CM

## 2019-08-02 DIAGNOSIS — J309 Allergic rhinitis, unspecified: Secondary | ICD-10-CM | POA: Diagnosis not present

## 2019-08-02 DIAGNOSIS — J3489 Other specified disorders of nose and nasal sinuses: Secondary | ICD-10-CM

## 2019-08-02 MED ORDER — FAMOTIDINE 20 MG PO TABS: 20.0000 mg | ORAL_TABLET | Freq: Every day | ORAL | 3 refills | Status: DC

## 2019-08-02 NOTE — Assessment & Plan Note (Signed)
bp has been low/pt also lost wt  Will hold hctz Continue lisinopril  Update   Also if her pnd /cough do not improve-may need to consider ace cough

## 2019-08-02 NOTE — Progress Notes (Signed)
Virtual Visit via Video Note  I connected with [name] on 08/02/19 at 10:45 AM EDT by a video enabled telemedicine application and verified that I am speaking with the correct person using two identifiers.  Location: Patient: home Provider: office    I discussed the limitations of evaluation and management by telemedicine and the availability of in person appointments. The patient expressed understanding and agreed to proceed.  History of Present Illness: Pt presents with nasal symptoms and cough  Also questions about BP   She has had pnd for a long time  Seeing allergist  Taking immunotx  Also px medication   singulair -does not help- she stopped it  Nasal spray -ipratropium  Has tried steroid ns  Was on zyrtec and claritin   Allergist is Dr Donneta Romberg   Eases things a little  Does not feels sick Temp 98  No fevers   Continuous drainage and she has to clear throat a lot and occ cough  Some sinus pressure  Mucous is clear and thick  A little cough- not productive   It interrupts her sleep   Talking and temp change flare her   No heartburn or known acid reflux   bp is 98/50 this am  At times a little higher  Has lost some weight     Patient Active Problem List   Diagnosis Date Noted  . Allergic rhinitis 08/02/2019  . Post-nasal drip 08/02/2019  . Rhinorrhea 11/15/2016  . Obesity (BMI 30-39.9) 11/15/2016  . Family history of colon cancer 05/14/2015  . Travel advice encounter 01/31/2015  . Routine general medical examination at a health care facility 10/01/2012  . ESSENTIAL HYPERTENSION, BENIGN 01/29/2010  . Hyperlipidemia 10/09/2007  . OSTEOARTHRITIS, KNEE 10/09/2007   Past Medical History:  Diagnosis Date  . Abnormal finding on Pap smear 2006   endometrial cells, biopsy ok  . Anxiety   . Fibroids    uterine  . Herpes zoster    mild case  . History of pelvic ultrasound 10/2007   uterine fibroids  . Hyperlipidemia   . Hypertension    with some  white coat component   Past Surgical History:  Procedure Laterality Date  . COLONOSCOPY    . CRYOABLATION  1982   abnormal pap  . INTRAUTERINE DEVICE INSERTION  2006  . MENISCUS REPAIR Right 2002, 03/07/2015  . WISDOM TOOTH EXTRACTION     Social History   Tobacco Use  . Smoking status: Never Smoker  . Smokeless tobacco: Never Used  Substance Use Topics  . Alcohol use: No    Alcohol/week: 0.0 standard drinks  . Drug use: No   Family History  Problem Relation Age of Onset  . Glaucoma Father   . Heart disease Mother   . Heart attack Mother        stent  . Hyperlipidemia Mother   . Colon cancer Mother 92       deceased 04/04/2014  . Hypertension Mother   . Stroke Mother   . Goiter Daughter    No Known Allergies Current Outpatient Medications on File Prior to Visit  Medication Sig Dispense Refill  . azelastine (ASTELIN) 0.1 % nasal spray Place into both nostrils 2 (two) times daily. Use in each nostril as directed    . ipratropium (ATROVENT) 0.06 % nasal spray     . lisinopril (PRINIVIL,ZESTRIL) 10 MG tablet Take 1 tablet (10 mg total) by mouth daily. 30 tablet 11  . loratadine (CLARITIN) 10 MG tablet Take  10 mg by mouth daily.    . montelukast (SINGULAIR) 10 MG tablet Take 10 mg by mouth at bedtime.    . Multiple Vitamin (MULTIVITAMIN) capsule Take 1 capsule by mouth daily.       No current facility-administered medications on file prior to visit.    Review of Systems  Constitutional: Negative for chills, fever and malaise/fatigue.  HENT: Positive for congestion. Negative for ear discharge, ear pain, sinus pain and sore throat.   Eyes: Negative for blurred vision, discharge and redness.  Respiratory: Positive for cough and sputum production. Negative for shortness of breath, wheezing and stridor.   Cardiovascular: Negative for chest pain, palpitations and leg swelling.  Gastrointestinal: Negative for abdominal pain, diarrhea, nausea and vomiting.  Musculoskeletal: Negative  for myalgias.  Skin: Negative for rash.  Neurological: Positive for dizziness. Negative for headaches.       Occ light headed when bp is low    Observations/Objective: Patient appears well, in no distress Weight is baseline  No facial swelling or asymmetry Normal voice-not hoarse and no slurred speech No obvious tremor or mobility impairment Moving neck and UEs normally Able to hear the call well Frequent throat clearing during interview/occasional cough  Talkative and mentally sharp with no cognitive changes No skin changes on face or neck , no rash or pallor Affect is normal    Assessment and Plan: Problem List Items Addressed This Visit      Cardiovascular and Mediastinum   ESSENTIAL HYPERTENSION, BENIGN    bp has been low/pt also lost wt  Will hold hctz Continue lisinopril  Update   Also if her pnd /cough do not improve-may need to consider ace cough        Respiratory   Allergic rhinitis    Runny nose and pnd with chronic throat clearing and cough  Needs to get back on combo of meds singulair  imratropium ns  astelin ns  claritin  Will add pepcid  Continue immunotx F/u with allergist         Other   Rhinorrhea    See a/p all rhinitis Seeing Dr Donneta Romberg      Post-nasal drip - Primary    With globus sensation Will work to maximize allergy tx  Also add pepcid   If no imp ? If any ace affect  She will update           Follow Up Instructions: Get back on your allergy medicines singulair (generic) clatiritn  asteline nasal spray ipratroprium nasal spray  Continue allergy immunotherapy and allergist follow up   Add generic pepcid for allergies and possible reflux   If no improvement we may need to try holding the lisinopril   For now hold the diuretic due to low blood pressure    I discussed the assessment and treatment plan with the patient. The patient was provided an opportunity to ask questions and all were answered. The patient agreed  with the plan and demonstrated an understanding of the instructions.   The patient was advised to call back or seek an in-person evaluation if the symptoms worsen or if the condition fails to improve as anticipated.     Loura Pardon, MD

## 2019-08-02 NOTE — Assessment & Plan Note (Signed)
With globus sensation Will work to maximize allergy tx  Also add pepcid   If no imp ? If any ace affect  She will update

## 2019-08-02 NOTE — Assessment & Plan Note (Signed)
Runny nose and pnd with chronic throat clearing and cough  Needs to get back on combo of meds singulair  imratropium ns  astelin ns  claritin  Will add pepcid  Continue immunotx F/u with allergist

## 2019-08-02 NOTE — Assessment & Plan Note (Signed)
See a/p all rhinitis Seeing Dr Donneta Romberg

## 2019-08-03 ENCOUNTER — Encounter: Payer: Self-pay | Admitting: Family Medicine

## 2019-08-03 NOTE — Patient Instructions (Addendum)
Get back on your allergy medicines singulair (generic) clatiritn  asteline nasal spray ipratroprium nasal spray  Continue allergy immunotherapy and allergist follow up   Add generic pepcid for allergies and possible reflux   If no improvement we may need to try holding the lisinopril   For now hold the diuretic due to low blood pressure

## 2019-08-07 DIAGNOSIS — J3089 Other allergic rhinitis: Secondary | ICD-10-CM | POA: Diagnosis not present

## 2019-08-07 DIAGNOSIS — J301 Allergic rhinitis due to pollen: Secondary | ICD-10-CM | POA: Diagnosis not present

## 2019-08-07 DIAGNOSIS — J3081 Allergic rhinitis due to animal (cat) (dog) hair and dander: Secondary | ICD-10-CM | POA: Diagnosis not present

## 2019-08-13 DIAGNOSIS — J3089 Other allergic rhinitis: Secondary | ICD-10-CM | POA: Diagnosis not present

## 2019-08-13 DIAGNOSIS — J3081 Allergic rhinitis due to animal (cat) (dog) hair and dander: Secondary | ICD-10-CM | POA: Diagnosis not present

## 2019-08-13 DIAGNOSIS — J301 Allergic rhinitis due to pollen: Secondary | ICD-10-CM | POA: Diagnosis not present

## 2019-08-16 DIAGNOSIS — J3089 Other allergic rhinitis: Secondary | ICD-10-CM | POA: Diagnosis not present

## 2019-08-16 DIAGNOSIS — J301 Allergic rhinitis due to pollen: Secondary | ICD-10-CM | POA: Diagnosis not present

## 2019-08-16 DIAGNOSIS — J3081 Allergic rhinitis due to animal (cat) (dog) hair and dander: Secondary | ICD-10-CM | POA: Diagnosis not present

## 2019-08-20 DIAGNOSIS — J301 Allergic rhinitis due to pollen: Secondary | ICD-10-CM | POA: Diagnosis not present

## 2019-08-20 DIAGNOSIS — J3081 Allergic rhinitis due to animal (cat) (dog) hair and dander: Secondary | ICD-10-CM | POA: Diagnosis not present

## 2019-08-20 DIAGNOSIS — J3089 Other allergic rhinitis: Secondary | ICD-10-CM | POA: Diagnosis not present

## 2019-08-23 DIAGNOSIS — J3089 Other allergic rhinitis: Secondary | ICD-10-CM | POA: Diagnosis not present

## 2019-08-23 DIAGNOSIS — J3081 Allergic rhinitis due to animal (cat) (dog) hair and dander: Secondary | ICD-10-CM | POA: Diagnosis not present

## 2019-08-23 DIAGNOSIS — J301 Allergic rhinitis due to pollen: Secondary | ICD-10-CM | POA: Diagnosis not present

## 2019-08-27 DIAGNOSIS — J3089 Other allergic rhinitis: Secondary | ICD-10-CM | POA: Diagnosis not present

## 2019-08-27 DIAGNOSIS — J301 Allergic rhinitis due to pollen: Secondary | ICD-10-CM | POA: Diagnosis not present

## 2019-08-27 DIAGNOSIS — J3081 Allergic rhinitis due to animal (cat) (dog) hair and dander: Secondary | ICD-10-CM | POA: Diagnosis not present

## 2019-09-03 DIAGNOSIS — J3089 Other allergic rhinitis: Secondary | ICD-10-CM | POA: Diagnosis not present

## 2019-09-03 DIAGNOSIS — J3081 Allergic rhinitis due to animal (cat) (dog) hair and dander: Secondary | ICD-10-CM | POA: Diagnosis not present

## 2019-09-03 DIAGNOSIS — J301 Allergic rhinitis due to pollen: Secondary | ICD-10-CM | POA: Diagnosis not present

## 2019-09-10 DIAGNOSIS — J3089 Other allergic rhinitis: Secondary | ICD-10-CM | POA: Diagnosis not present

## 2019-09-10 DIAGNOSIS — J301 Allergic rhinitis due to pollen: Secondary | ICD-10-CM | POA: Diagnosis not present

## 2019-09-10 DIAGNOSIS — J3081 Allergic rhinitis due to animal (cat) (dog) hair and dander: Secondary | ICD-10-CM | POA: Diagnosis not present

## 2019-09-12 DIAGNOSIS — H1045 Other chronic allergic conjunctivitis: Secondary | ICD-10-CM | POA: Diagnosis not present

## 2019-09-12 DIAGNOSIS — J3081 Allergic rhinitis due to animal (cat) (dog) hair and dander: Secondary | ICD-10-CM | POA: Diagnosis not present

## 2019-09-12 DIAGNOSIS — J301 Allergic rhinitis due to pollen: Secondary | ICD-10-CM | POA: Diagnosis not present

## 2019-09-12 DIAGNOSIS — J3089 Other allergic rhinitis: Secondary | ICD-10-CM | POA: Diagnosis not present

## 2019-09-17 DIAGNOSIS — J301 Allergic rhinitis due to pollen: Secondary | ICD-10-CM | POA: Diagnosis not present

## 2019-09-17 DIAGNOSIS — J3081 Allergic rhinitis due to animal (cat) (dog) hair and dander: Secondary | ICD-10-CM | POA: Diagnosis not present

## 2019-09-17 DIAGNOSIS — J3089 Other allergic rhinitis: Secondary | ICD-10-CM | POA: Diagnosis not present

## 2019-09-21 ENCOUNTER — Other Ambulatory Visit: Payer: Self-pay

## 2019-09-21 ENCOUNTER — Ambulatory Visit
Admission: EM | Admit: 2019-09-21 | Discharge: 2019-09-21 | Disposition: A | Discharge status: Home / Self Care | Payer: Federal, State, Local not specified - PPO | Attending: Emergency Medicine | Admitting: Emergency Medicine

## 2019-09-21 DIAGNOSIS — I10 Essential (primary) hypertension: Secondary | ICD-10-CM

## 2019-09-21 DIAGNOSIS — R030 Elevated blood-pressure reading, without diagnosis of hypertension: Secondary | ICD-10-CM | POA: Diagnosis not present

## 2019-09-21 DIAGNOSIS — Z20828 Contact with and (suspected) exposure to other viral communicable diseases: Secondary | ICD-10-CM | POA: Diagnosis not present

## 2019-09-21 DIAGNOSIS — J0100 Acute maxillary sinusitis, unspecified: Secondary | ICD-10-CM

## 2019-09-21 MED ORDER — AMOXICILLIN 500 MG PO TABS: 500.0000 mg | ORAL_TABLET | Freq: Three times a day (TID) | ORAL | 0 refills | Status: DC

## 2019-09-21 NOTE — ED Provider Notes (Signed)
[facility]    CSN: [id number] Arrival date & time: 09/21/19  1528      History   Chief Complaint Chief Complaint  Patient presents with  . Sinus Problem    HPI [name] is a 58 y.o. female.   Patient presents with 2 to 3-week history of postnasal drip.  She reports worsening symptoms x1 week, including sinus headache, congestion, and maxillary sinus pressure.  She denies fever, chills, sore throat, cough, shortness of breath, vomiting, diarrhea, rash, or other symptoms.  She has attempted treatment at home with OTC Sudafed which she took 1 hour PTA.  She reports a history of "allergies" and states her usual medications are not helping.  Also, patient states she has not taken her blood pressure medication today.  She denies weakness, dizziness, palpitations, chest pain, or shortness of breath.  The history is provided by the patient.    Past Medical History:  Diagnosis Date  . Abnormal finding on Pap smear 2006   endometrial cells, biopsy ok  . Anxiety   . Fibroids    uterine  . Herpes zoster    mild case  . History of pelvic ultrasound 10/2007   uterine fibroids  . Hyperlipidemia   . Hypertension    with some white coat component    Patient Active Problem List   Diagnosis Date Noted  . Allergic rhinitis 08/02/2019  . Post-nasal drip 08/02/2019  . Rhinorrhea 11/15/2016  . Obesity (BMI 30-39.9) 11/15/2016  . Family history of colon cancer 05/14/2015  . Travel advice encounter 01/31/2015  . Routine general medical examination at a health care facility 10/01/2012  . ESSENTIAL HYPERTENSION, BENIGN 01/29/2010  . Hyperlipidemia 10/09/2007  . OSTEOARTHRITIS, KNEE 10/09/2007    Past Surgical History:  Procedure Laterality Date  . COLONOSCOPY    . CRYOABLATION  1982   abnormal pap  . INTRAUTERINE DEVICE INSERTION  2006  . MENISCUS REPAIR Right 2002, 03/07/2015  . WISDOM TOOTH EXTRACTION      OB History    Gravida  2   Para  2   Term  2   Preterm      AB      Living  2     SAB      TAB      Ectopic      Multiple      Live Births  2            Home Medications    Prior to Admission medications   Medication Sig Start Date End Date Taking? Authorizing Provider  amoxicillin (AMOXIL) 500 MG tablet Take 1 tablet (500 mg total) by mouth 3 (three) times daily. 09/21/19   Sharion Balloon, NP  azelastine (ASTELIN) 0.1 % nasal spray Place into both nostrils 2 (two) times daily. Use in each nostril as directed    [provider]  famotidine (PEPCID) 20 MG tablet Take 1 tablet (20 mg total) by mouth daily. 08/02/19   Tower, Wynelle Fanny, MD  ipratropium (ATROVENT) 0.06 % nasal spray  09/18/16   [provider]  lisinopril (PRINIVIL,ZESTRIL) 10 MG tablet Take 1 tablet (10 mg total) by mouth daily. 12/18/18   Tower, Wynelle Fanny, MD  loratadine (CLARITIN) 10 MG tablet Take 10 mg by mouth daily.    [provider]  montelukast (SINGULAIR) 10 MG tablet Take 10 mg by mouth at bedtime.    [provider]  Multiple Vitamin (MULTIVITAMIN) capsule Take 1 capsule by mouth daily.  [provider]    Family History Family History  Problem Relation Age of Onset  . Glaucoma Father   . Heart disease Mother   . Heart attack Mother        stent  . Hyperlipidemia Mother   . Colon cancer Mother 53       deceased 2014/03/17  . Hypertension Mother   . Stroke Mother   . Goiter Daughter     Social History Social History   Tobacco Use  . Smoking status: Never Smoker  . Smokeless tobacco: Never Used  Substance Use Topics  . Alcohol use: No    Alcohol/week: 0.0 standard drinks  . Drug use: No     Allergies   Patient has no known allergies.   Review of Systems Review of Systems  Constitutional: Negative for chills and fever.  HENT: Positive for congestion, postnasal drip and sinus pressure. Negative for ear pain and sore throat.   Eyes: Negative for pain and visual disturbance.    Respiratory: Negative for cough and shortness of breath.   Cardiovascular: Negative for chest pain and palpitations.  Gastrointestinal: Negative for abdominal pain, diarrhea, nausea and vomiting.  Genitourinary: Negative for dysuria and hematuria.  Musculoskeletal: Negative for arthralgias and back pain.  Skin: Negative for color change and rash.  Neurological: Positive for headaches. Negative for dizziness, tremors, seizures, syncope, facial asymmetry, speech difficulty, weakness, light-headedness and numbness.  All other systems reviewed and are negative.    Physical Exam Triage Vital Signs ED Triage Vitals [09/21/19 1530]  Enc Vitals Group     BP      Pulse      Resp      Temp      Temp src      SpO2      Weight 175 lb (79.4 kg)     Height      Head Circumference      Peak Flow      Pain Score 5     Pain Loc      Pain Edu?      Excl. in [place]?    No data found.  Updated Vital Signs BP (!) 170/100 (BP Location: Left Arm) Comment: Pt. states she has NOT taken her BP meds yet for today(3rd retake)  Pulse 93   Temp 99 F (37.2 C) (Oral)   Resp 17   Wt 175 lb (79.4 kg)   LMP 01/25/2012   SpO2 96%   BMI 29.35 kg/m   Visual Acuity Right Eye Distance:   Left Eye Distance:   Bilateral Distance:    Right Eye Near:   Left Eye Near:    Bilateral Near:     Physical Exam Vitals and nursing note reviewed.  Constitutional:      General: She is not in acute distress.    Appearance: She is well-developed. She is not ill-appearing.  HENT:     Head: Normocephalic and atraumatic.     Right Ear: Tympanic membrane normal.     Left Ear: Tympanic membrane normal.     Nose: Congestion present.     Comments: Bilateral maxillary sinus tenderness.    Mouth/Throat:     Mouth: Mucous membranes are moist.     Pharynx: Oropharynx is clear.  Eyes:     Conjunctiva/sclera: Conjunctivae normal.  Cardiovascular:     Rate and Rhythm: Normal rate and regular rhythm.     Heart sounds:  No murmur.  Pulmonary:     Effort:  Pulmonary effort is normal. No respiratory distress.     Breath sounds: Normal breath sounds.  Abdominal:     General: Bowel sounds are normal.     Palpations: Abdomen is soft.     Tenderness: There is no abdominal tenderness. There is no right CVA tenderness, left CVA tenderness, guarding or rebound.  Musculoskeletal:     Cervical back: Neck supple.  Skin:    General: Skin is warm and dry.     Findings: No rash.  Neurological:     General: No focal deficit present.     Mental Status: She is alert and oriented to person, place, and time.     Sensory: No sensory deficit.     Motor: No weakness.     Gait: Gait normal.  Psychiatric:        Mood and Affect: Mood normal.        Behavior: Behavior normal.      UC Treatments / Results  Labs (all labs ordered are listed, but only abnormal results are displayed) Labs Reviewed - No data to display  EKG   Radiology No results found.  Procedures Procedures (including critical care time)  Medications Ordered in UC Medications - No data to display  Initial Impression / Assessment and Plan / UC Course  I have reviewed the triage vital signs and the nursing notes.  Pertinent labs & imaging results that were available during my care of the patient were reviewed by me and considered in my medical decision making (see chart for details).    Maxillary sinusitis.  Elevated blood pressure with known hypertension.  Treating with amoxicillin.  Instructed patient that she can take plain OTC Mucinex for her nasal congestion.  Instructed her to stop taking OTC Sudafed or other OTC products that can raise her blood pressure.  Instructed her to follow-up with her PCP to have her blood pressure rechecked in 1 to 2 weeks.  Instructed her also to follow-up with her PCP if her symptoms or not improving.  Patient agrees to this plan of care.     Final Clinical Impressions(s) / UC Diagnoses   Final diagnoses:    Acute non-recurrent maxillary sinusitis  Elevated blood pressure reading in office with diagnosis of hypertension     Discharge Instructions     Take the antibiotic amoxicillin as directed.  You can take plain over-the-counter Mucinex.    Avoid using over-the-counter medications such as Sudafed because they can raise your blood pressure.    Follow up with your primary care provider if your symptoms are not improving.  Your blood pressure is elevated today at 170/100.  Please have this rechecked by your primary care provider in 1-2 weeks.        ED Prescriptions    Medication Sig Dispense Auth. Provider   amoxicillin (AMOXIL) 500 MG tablet Take 1 tablet (500 mg total) by mouth 3 (three) times daily. 21 tablet Sharion Balloon, NP     PDMP not reviewed this encounter.   Sharion Balloon, NP 09/21/19 [phone]

## 2019-09-21 NOTE — ED Triage Notes (Signed)
Pt. States for a week her sinus have been bothering her, she's having headaches, nasal drainage, pressure on her nose constantly.

## 2019-09-21 NOTE — Discharge Instructions (Addendum)
Take the antibiotic amoxicillin as directed.  You can take plain over-the-counter Mucinex.    Avoid using over-the-counter medications such as Sudafed because they can raise your blood pressure.    Follow up with your primary care provider if your symptoms are not improving.  Your blood pressure is elevated today at 170/100.  Please have this rechecked by your primary care provider in 1-2 weeks.

## 2019-09-24 DIAGNOSIS — J3081 Allergic rhinitis due to animal (cat) (dog) hair and dander: Secondary | ICD-10-CM | POA: Diagnosis not present

## 2019-09-24 DIAGNOSIS — J3089 Other allergic rhinitis: Secondary | ICD-10-CM | POA: Diagnosis not present

## 2019-09-24 DIAGNOSIS — J301 Allergic rhinitis due to pollen: Secondary | ICD-10-CM | POA: Diagnosis not present

## 2019-10-01 DIAGNOSIS — J301 Allergic rhinitis due to pollen: Secondary | ICD-10-CM | POA: Diagnosis not present

## 2019-10-01 DIAGNOSIS — J3089 Other allergic rhinitis: Secondary | ICD-10-CM | POA: Diagnosis not present

## 2019-10-01 DIAGNOSIS — J3081 Allergic rhinitis due to animal (cat) (dog) hair and dander: Secondary | ICD-10-CM | POA: Diagnosis not present

## 2019-10-08 DIAGNOSIS — J3081 Allergic rhinitis due to animal (cat) (dog) hair and dander: Secondary | ICD-10-CM | POA: Diagnosis not present

## 2019-10-08 DIAGNOSIS — J301 Allergic rhinitis due to pollen: Secondary | ICD-10-CM | POA: Diagnosis not present

## 2019-10-08 DIAGNOSIS — J3089 Other allergic rhinitis: Secondary | ICD-10-CM | POA: Diagnosis not present

## 2019-10-10 DIAGNOSIS — K219 Gastro-esophageal reflux disease without esophagitis: Secondary | ICD-10-CM | POA: Diagnosis not present

## 2019-10-10 DIAGNOSIS — R030 Elevated blood-pressure reading, without diagnosis of hypertension: Secondary | ICD-10-CM | POA: Diagnosis not present

## 2019-10-10 DIAGNOSIS — J302 Other seasonal allergic rhinitis: Secondary | ICD-10-CM | POA: Diagnosis not present

## 2019-10-15 DIAGNOSIS — J3089 Other allergic rhinitis: Secondary | ICD-10-CM | POA: Diagnosis not present

## 2019-10-15 DIAGNOSIS — J3081 Allergic rhinitis due to animal (cat) (dog) hair and dander: Secondary | ICD-10-CM | POA: Diagnosis not present

## 2019-10-15 DIAGNOSIS — J301 Allergic rhinitis due to pollen: Secondary | ICD-10-CM | POA: Diagnosis not present

## 2019-10-16 DIAGNOSIS — Z713 Dietary counseling and surveillance: Secondary | ICD-10-CM | POA: Diagnosis not present

## 2019-10-22 DIAGNOSIS — J3081 Allergic rhinitis due to animal (cat) (dog) hair and dander: Secondary | ICD-10-CM | POA: Diagnosis not present

## 2019-10-22 DIAGNOSIS — J301 Allergic rhinitis due to pollen: Secondary | ICD-10-CM | POA: Diagnosis not present

## 2019-10-22 DIAGNOSIS — J3089 Other allergic rhinitis: Secondary | ICD-10-CM | POA: Diagnosis not present

## 2019-10-26 ENCOUNTER — Other Ambulatory Visit: Payer: Self-pay | Admitting: Family Medicine

## 2019-10-26 DIAGNOSIS — Z1231 Encounter for screening mammogram for malignant neoplasm of breast: Secondary | ICD-10-CM

## 2019-10-28 ENCOUNTER — Encounter: Payer: Self-pay | Admitting: Family Medicine

## 2019-10-29 ENCOUNTER — Other Ambulatory Visit: Payer: Self-pay | Admitting: Family Medicine

## 2019-10-29 ENCOUNTER — Encounter: Payer: Self-pay | Admitting: Family Medicine

## 2019-10-29 DIAGNOSIS — J3089 Other allergic rhinitis: Secondary | ICD-10-CM | POA: Diagnosis not present

## 2019-10-29 DIAGNOSIS — J3081 Allergic rhinitis due to animal (cat) (dog) hair and dander: Secondary | ICD-10-CM | POA: Diagnosis not present

## 2019-10-29 DIAGNOSIS — J301 Allergic rhinitis due to pollen: Secondary | ICD-10-CM | POA: Diagnosis not present

## 2019-10-29 MED ORDER — HYDROCHLOROTHIAZIDE 12.5 MG PO CAPS: 12.5000 mg | ORAL_CAPSULE | Freq: Every morning | ORAL | 3 refills | Status: DC

## 2019-11-05 DIAGNOSIS — J301 Allergic rhinitis due to pollen: Secondary | ICD-10-CM | POA: Diagnosis not present

## 2019-11-05 DIAGNOSIS — J3081 Allergic rhinitis due to animal (cat) (dog) hair and dander: Secondary | ICD-10-CM | POA: Diagnosis not present

## 2019-11-05 DIAGNOSIS — J3089 Other allergic rhinitis: Secondary | ICD-10-CM | POA: Diagnosis not present

## 2019-11-12 DIAGNOSIS — J3089 Other allergic rhinitis: Secondary | ICD-10-CM | POA: Diagnosis not present

## 2019-11-12 DIAGNOSIS — J3081 Allergic rhinitis due to animal (cat) (dog) hair and dander: Secondary | ICD-10-CM | POA: Diagnosis not present

## 2019-11-12 DIAGNOSIS — J301 Allergic rhinitis due to pollen: Secondary | ICD-10-CM | POA: Diagnosis not present

## 2019-11-19 DIAGNOSIS — J3081 Allergic rhinitis due to animal (cat) (dog) hair and dander: Secondary | ICD-10-CM | POA: Diagnosis not present

## 2019-11-19 DIAGNOSIS — J3089 Other allergic rhinitis: Secondary | ICD-10-CM | POA: Diagnosis not present

## 2019-11-26 DIAGNOSIS — J301 Allergic rhinitis due to pollen: Secondary | ICD-10-CM | POA: Diagnosis not present

## 2019-11-26 DIAGNOSIS — J3081 Allergic rhinitis due to animal (cat) (dog) hair and dander: Secondary | ICD-10-CM | POA: Diagnosis not present

## 2019-11-26 DIAGNOSIS — J3089 Other allergic rhinitis: Secondary | ICD-10-CM | POA: Diagnosis not present

## 2019-11-27 ENCOUNTER — Ambulatory Visit: Payer: Federal, State, Local not specified - PPO

## 2019-11-27 ENCOUNTER — Telehealth: Payer: Self-pay | Admitting: Family Medicine

## 2019-11-27 DIAGNOSIS — I10 Essential (primary) hypertension: Secondary | ICD-10-CM

## 2019-11-27 DIAGNOSIS — E7800 Pure hypercholesterolemia, unspecified: Secondary | ICD-10-CM

## 2019-11-27 NOTE — Telephone Encounter (Signed)
-----   Message from Cloyd Stagers, RT sent at 11/15/2019  2:12 PM EST ----- Regarding: Lab Orders for Wednesday 2.17.2021 Please place lab orders for Wednesday 2.17.2021, office visit for physical on Monday 2.22.2021 Thank you, Dyke Maes RT(R)

## 2019-11-28 ENCOUNTER — Other Ambulatory Visit: Payer: Self-pay

## 2019-11-28 ENCOUNTER — Other Ambulatory Visit (INDEPENDENT_AMBULATORY_CARE_PROVIDER_SITE_OTHER): Payer: Federal, State, Local not specified - PPO

## 2019-11-28 DIAGNOSIS — E7800 Pure hypercholesterolemia, unspecified: Secondary | ICD-10-CM | POA: Diagnosis not present

## 2019-11-28 DIAGNOSIS — I10 Essential (primary) hypertension: Secondary | ICD-10-CM

## 2019-11-28 LAB — COMPREHENSIVE METABOLIC PANEL
ALT: 7 U/L (ref 0–35)
AST: 18 U/L (ref 0–37)
Albumin: 4 g/dL (ref 3.5–5.2)
Alkaline Phosphatase: 93 U/L (ref 39–117)
BUN: 9 mg/dL (ref 6–23)
CO2: 32 mEq/L (ref 19–32)
Calcium: 9.7 mg/dL (ref 8.4–10.5)
Chloride: 99 mEq/L (ref 96–112)
Creatinine, Ser: 1 mg/dL (ref 0.40–1.20)
GFR: 68.18 mL/min (ref >60.00)
Glucose, Bld: 97 mg/dL (ref 70–99)
Potassium: 3.5 mEq/L (ref 3.5–5.1)
Sodium: 137 mEq/L (ref 135–145)
Total Bilirubin: 0.9 mg/dL (ref 0.2–1.2)
Total Protein: 7.6 g/dL (ref 6.0–8.3)

## 2019-11-28 LAB — CBC WITH DIFFERENTIAL/PLATELET
Basophils Absolute: 0 10*3/uL (ref 0.0–0.1)
Basophils Relative: 0.6 % (ref 0.0–3.0)
Eosinophils Absolute: 0.2 10*3/uL (ref 0.0–0.7)
Eosinophils Relative: 2.4 % (ref 0.0–5.0)
HCT: 39.3 % (ref 36.0–46.0)
Hemoglobin: 13.2 g/dL (ref 12.0–15.0)
Lymphocytes Relative: 32.7 % (ref 12.0–46.0)
Lymphs Abs: 2.1 10*3/uL (ref 0.7–4.0)
MCHC: 33.5 g/dL (ref 30.0–36.0)
MCV: 96.2 fl (ref 78.0–100.0)
Monocytes Absolute: 0.6 10*3/uL (ref 0.1–1.0)
Monocytes Relative: 8.8 % (ref 3.0–12.0)
Neutro Abs: 3.6 10*3/uL (ref 1.4–7.7)
Neutrophils Relative %: 55.5 % (ref 43.0–77.0)
Platelets: 254 10*3/uL (ref 150.0–400.0)
RBC: 4.09 Mil/uL (ref 3.87–5.11)
RDW: 12.7 % (ref 11.5–15.5)
WBC: 6.4 10*3/uL (ref 4.0–10.5)

## 2019-11-28 LAB — TSH: TSH: 2.74 u[IU]/mL (ref 0.35–4.50)

## 2019-11-28 LAB — LIPID PANEL
Cholesterol: 207 mg/dL — ABNORMAL HIGH (ref 0–200)
HDL: 76.8 mg/dL (ref >39.00)
LDL Cholesterol: 112 mg/dL — ABNORMAL HIGH (ref 0–99)
NonHDL: 130.6
Total CHOL/HDL Ratio: 3
Triglycerides: 91 mg/dL (ref 0.0–149.0)
VLDL: 18.2 mg/dL (ref 0.0–40.0)

## 2019-12-03 ENCOUNTER — Other Ambulatory Visit: Payer: Self-pay

## 2019-12-03 ENCOUNTER — Ambulatory Visit (INDEPENDENT_AMBULATORY_CARE_PROVIDER_SITE_OTHER): Payer: Federal, State, Local not specified - PPO | Admitting: Family Medicine

## 2019-12-03 ENCOUNTER — Encounter: Payer: Self-pay | Admitting: Family Medicine

## 2019-12-03 VITALS — BP 133/88 | HR 64 | Temp 97.0°F | Ht 64.75 in | Wt 170.1 lb

## 2019-12-03 DIAGNOSIS — I10 Essential (primary) hypertension: Secondary | ICD-10-CM

## 2019-12-03 DIAGNOSIS — E7800 Pure hypercholesterolemia, unspecified: Secondary | ICD-10-CM

## 2019-12-03 DIAGNOSIS — J3081 Allergic rhinitis due to animal (cat) (dog) hair and dander: Secondary | ICD-10-CM | POA: Diagnosis not present

## 2019-12-03 DIAGNOSIS — Z0000 Encounter for general adult medical examination without abnormal findings: Secondary | ICD-10-CM | POA: Diagnosis not present

## 2019-12-03 DIAGNOSIS — J301 Allergic rhinitis due to pollen: Secondary | ICD-10-CM | POA: Diagnosis not present

## 2019-12-03 DIAGNOSIS — J3089 Other allergic rhinitis: Secondary | ICD-10-CM | POA: Diagnosis not present

## 2019-12-03 DIAGNOSIS — E663 Overweight: Secondary | ICD-10-CM | POA: Diagnosis not present

## 2019-12-03 DIAGNOSIS — K219 Gastro-esophageal reflux disease without esophagitis: Secondary | ICD-10-CM | POA: Insufficient documentation

## 2019-12-03 NOTE — Progress Notes (Signed)
Subjective:    Patient ID: [name], female    DOB: [birth year], 59 y.o.   MRN: [id number]  This visit occurred during the SARS-CoV-2 public health emergency.  Safety protocols were in place, including screening questions prior to the visit, additional usage of staff PPE, and extensive cleaning of exam room while observing appropriate contact time as indicated for disinfecting solutions.    HPI Here for health maintenance exam and to review chronic medical problems    Wt Readings from Last 3 Encounters:  12/03/19 170 lb 1 oz (77.1 kg)  09/21/19 175 lb (79.4 kg)  08/02/19 174 lb (78.9 kg)   Exercise - swim and bikes and lifts weights  Still eating healthy - just started working with a nutritionist  28.52 kg/m    Struggled with a cough for a long time  Saw ENT- suspected GERD and discussed some dietary changes  Also stopped her lisinopril this helped also  Now her cough is improved   She started coffee again- and feels like there is mucous in throat that starts coughing    Mammogram 2/20  Scheduled for 12/18/19 Self breast exam-no lumps   Pap 3/19 -neg and neg HPV  Has gyn visit on Wednesday   Colonoscopy 3/18  5 y recall  No stool changes   Tdap 12/13 Flu shot 10/20 Zoster status - interested in shingrix   Plans to get the covid vaccines     bp is stable today  No cp or palpitations or headaches or edema  No side effects to medicines  BP Readings from Last 3 Encounters:  12/03/19 (!) 152/86  09/21/19 (!) 170/100  08/02/19 115/69    Takes hctz 12.5 mg daily   Off the lisinopril - cough  She gets anxious in the office   At home BP - never higher than [id number] systolic (usually has to check it twice)    Hyperlipidemia Lab Results  Component Value Date   CHOL 207 (H) 11/28/2019   CHOL 213 (H) 02/22/2019   CHOL 218 (H) 11/23/2018   Lab Results  Component Value Date   HDL 76.80 11/28/2019   HDL 64.40 02/22/2019   HDL 75.40 11/23/2018   Lab Results    Component Value Date   LDLCALC 112 (H) 11/28/2019   LDLCALC 129 (H) 02/22/2019   LDLCALC 126 (H) 11/23/2018   Lab Results  Component Value Date   TRIG 91.0 11/28/2019   TRIG 94.0 02/22/2019   TRIG 84.0 11/23/2018   Lab Results  Component Value Date   CHOLHDL 3 11/28/2019   CHOLHDL 3 02/22/2019   CHOLHDL 3 11/23/2018   Lab Results  Component Value Date   LDLDIRECT 128.6 10/17/2013   LDLDIRECT 128.8 10/02/2012   LDLDIRECT 129.8 02/11/2011   Seeing  A nutritionist  This is helping  Has cut out sweets   Other labs Results for orders placed or performed in visit on 11/28/19  TSH  Result Value Ref Range   TSH 2.74 0.35 - 4.50 uIU/mL  Lipid panel  Result Value Ref Range   Cholesterol 207 (H) 0 - 200 mg/dL   Triglycerides 91.0 0.0 - 149.0 mg/dL   HDL 76.80 >39.00 mg/dL   VLDL 18.2 0.0 - 40.0 mg/dL   LDL Cholesterol 112 (H) 0 - 99 mg/dL   Total CHOL/HDL Ratio 3    NonHDL 130.60   Comprehensive metabolic panel  Result Value Ref Range   Sodium 137 135 - 145 mEq/L   Potassium 3.5 3.5 -  5.1 mEq/L   Chloride 99 96 - 112 mEq/L   CO2 32 19 - 32 mEq/L   Glucose, Bld 97 70 - 99 mg/dL   BUN 9 6 - 23 mg/dL   Creatinine, Ser 1.00 0.40 - 1.20 mg/dL   Total Bilirubin 0.9 0.2 - 1.2 mg/dL   Alkaline Phosphatase 93 39 - 117 U/L   AST 18 0 - 37 U/L   ALT 7 0 - 35 U/L   Total Protein 7.6 6.0 - 8.3 g/dL   Albumin 4.0 3.5 - 5.2 g/dL   GFR 68.18 >60.00 mL/min   Calcium 9.7 8.4 - 10.5 mg/dL  CBC with Differential/Platelet  Result Value Ref Range   WBC 6.4 4.0 - 10.5 K/uL   RBC 4.09 3.87 - 5.11 Mil/uL   Hemoglobin 13.2 12.0 - 15.0 g/dL   HCT 39.3 36.0 - 46.0 %   MCV 96.2 78.0 - 100.0 fl   MCHC 33.5 30.0 - 36.0 g/dL   RDW 12.7 11.5 - 15.5 %   Platelets 254.0 150.0 - 400.0 K/uL   Neutrophils Relative % 55.5 43.0 - 77.0 %   Lymphocytes Relative 32.7 12.0 - 46.0 %   Monocytes Relative 8.8 3.0 - 12.0 %   Eosinophils Relative 2.4 0.0 - 5.0 %   Basophils Relative 0.6 0.0 - 3.0 %    Neutro Abs 3.6 1.4 - 7.7 K/uL   Lymphs Abs 2.1 0.7 - 4.0 K/uL   Monocytes Absolute 0.6 0.1 - 1.0 K/uL   Eosinophils Absolute 0.2 0.0 - 0.7 K/uL   Basophils Absolute 0.0 0.0 - 0.1 K/uL     Patient Active Problem List   Diagnosis Date Noted  . GERD (gastroesophageal reflux disease) 12/03/2019  . Allergic rhinitis 08/02/2019  . Post-nasal drip 08/02/2019  . Rhinorrhea 11/15/2016  . Overweight (BMI 25.0-29.9) 11/15/2016  . Family history of colon cancer 05/14/2015  . Travel advice encounter 01/31/2015  . Routine general medical examination at a health care facility 10/01/2012  . ESSENTIAL HYPERTENSION, BENIGN 01/29/2010  . Hyperlipidemia 10/09/2007  . OSTEOARTHRITIS, KNEE 10/09/2007   Past Medical History:  Diagnosis Date  . Abnormal finding on Pap smear 2006   endometrial cells, biopsy ok  . Anxiety   . Fibroids    uterine  . Herpes zoster    mild case  . History of pelvic ultrasound 10/2007   uterine fibroids  . Hyperlipidemia   . Hypertension    with some white coat component   Past Surgical History:  Procedure Laterality Date  . COLONOSCOPY    . CRYOABLATION  1982   abnormal pap  . INTRAUTERINE DEVICE INSERTION  2006  . MENISCUS REPAIR Right 2002, 03/07/2015  . WISDOM TOOTH EXTRACTION     Social History   Tobacco Use  . Smoking status: Never Smoker  . Smokeless tobacco: Never Used  Substance Use Topics  . Alcohol use: No    Alcohol/week: 0.0 standard drinks  . Drug use: No   Family History  Problem Relation Age of Onset  . Glaucoma Father   . Heart disease Mother   . Heart attack Mother        stent  . Hyperlipidemia Mother   . Colon cancer Mother 35       deceased 2014/03/14  . Hypertension Mother   . Stroke Mother   . Goiter Daughter    No Known Allergies Current Outpatient Medications on File Prior to Visit  Medication Sig Dispense Refill  . azelastine (ASTELIN) 0.1 %  nasal spray Place into both nostrils 2 (two) times daily. Use in each nostril as  directed    . hydrochlorothiazide (MICROZIDE) 12.5 MG capsule Take 1 capsule (12.5 mg total) by mouth every morning. 90 capsule 3  . ipratropium (ATROVENT) 0.06 % nasal spray     . loratadine (CLARITIN) 10 MG tablet Take 10 mg by mouth daily.    . montelukast (SINGULAIR) 10 MG tablet Take 10 mg by mouth at bedtime.    . Multiple Vitamin (MULTIVITAMIN) capsule Take 1 capsule by mouth daily.       No current facility-administered medications on file prior to visit.    Review of Systems  Constitutional: Negative for activity change, appetite change, fatigue, fever and unexpected weight change.  HENT: Negative for congestion, ear pain, rhinorrhea, sinus pressure and sore throat.   Eyes: Negative for pain, redness and visual disturbance.  Respiratory: Negative for cough, shortness of breath and wheezing.        Occ globus sensation and cough  Cardiovascular: Negative for chest pain and palpitations.  Gastrointestinal: Negative for abdominal pain, blood in stool, constipation and diarrhea.  Endocrine: Negative for polydipsia and polyuria.  Genitourinary: Negative for dysuria, frequency and urgency.  Musculoskeletal: Negative for arthralgias, back pain and myalgias.  Skin: Negative for pallor and rash.  Allergic/Immunologic: Negative for environmental allergies.  Neurological: Negative for dizziness, syncope and headaches.  Hematological: Negative for adenopathy. Does not bruise/bleed easily.  Psychiatric/Behavioral: Positive for sleep disturbance. Negative for decreased concentration and dysphoric mood. The patient is nervous/anxious.        Objective:   Physical Exam Constitutional:      General: She is not in acute distress.    Appearance: Normal appearance. She is well-developed and normal weight. She is not ill-appearing or diaphoretic.  HENT:     Head: Normocephalic and atraumatic.     Right Ear: Tympanic membrane, ear canal and external ear normal.     Left Ear: Tympanic membrane,  ear canal and external ear normal.     Nose: Nose normal. No congestion.     Mouth/Throat:     Mouth: Mucous membranes are moist.     Pharynx: Oropharynx is clear. No posterior oropharyngeal erythema.  Eyes:     General: No scleral icterus.    Extraocular Movements: Extraocular movements intact.     Conjunctiva/sclera: Conjunctivae normal.     Pupils: Pupils are equal, round, and reactive to light.  Neck:     Thyroid: No thyromegaly.     Vascular: No carotid bruit or JVD.  Cardiovascular:     Rate and Rhythm: Normal rate and regular rhythm.     Pulses: Normal pulses.     Heart sounds: Normal heart sounds. No gallop.   Pulmonary:     Effort: Pulmonary effort is normal. No respiratory distress.     Breath sounds: Normal breath sounds. No wheezing.     Comments: Good air exch Chest:     Chest wall: No tenderness.  Abdominal:     General: Bowel sounds are normal. There is no distension or abdominal bruit.     Palpations: Abdomen is soft. There is no mass.     Tenderness: There is no abdominal tenderness.     Hernia: No hernia is present.  Genitourinary:    Comments: Gyn provider does breast and pelvic exam Musculoskeletal:        General: No tenderness. Normal range of motion.     Cervical back: Normal range of motion  and neck supple. No rigidity. No muscular tenderness.     Right lower leg: No edema.     Left lower leg: No edema.  Lymphadenopathy:     Cervical: No cervical adenopathy.  Skin:    General: Skin is warm and dry.     Coloration: Skin is not pale.     Findings: No erythema or rash.     Comments: Some lentigines and skin tags  Neurological:     Mental Status: She is alert. Mental status is at baseline.     Cranial Nerves: No cranial nerve deficit.     Motor: No abnormal muscle tone.     Coordination: Coordination normal.     Gait: Gait normal.     Deep Tendon Reflexes: Reflexes are normal and symmetric.  Psychiatric:        Mood and Affect: Mood normal.         Cognition and Memory: Cognition and memory normal.           Assessment & Plan:   Problem List Items Addressed This Visit      Cardiovascular and Mediastinum   ESSENTIAL HYPERTENSION, BENIGN    bp in fair control at this time  BP Readings from Last 1 Encounters:  12/03/19 133/88   No changes needed Most recent labs reviewed  Disc lifstyle change with low sodium diet and exercise          Digestive   GERD (gastroesophageal reflux disease)    Discussed diet-handout given re: foods to avoid  Also disc not eating late pepcid prn is ok otc Her symptom is globus sens and cough If no improvement or needs this often will let us know          Other   Hyperlipidemia    Disc goals for lipids and reasons to control them Rev last labs with pt Rev low sat fat diet in detail Improved LDL with help of better eating/ nutritionist       Routine general medical examination at a health care facility - Primary    Reviewed health habits including diet and exercise and skin cancer prevention Reviewed appropriate screening tests for age  Also reviewed health mt list, fam hx and immunization status , as well as social and family history   See HPI Labs reviewed  Disc strategy for GERd ad anxiety symptoms  For gyn visit on Wednesday Mammogram scheduled next month  Enc her to get covid vaccines when able  Also discussed shingrix vaccine       Overweight (BMI 25.0-29.9)    Discussed how this problem influences overall health and the risks it imposes  Reviewed plan for weight loss with lower calorie diet (via better food choices and also portion control or program like weight watchers) and exercise building up to or more than 30 minutes 5 days per week including some aerobic activity   Commended on wt loss so far with exercise and nutritionist help

## 2019-12-03 NOTE — Assessment & Plan Note (Signed)
Discussed diet-handout given re: foods to avoid  Also disc not eating late pepcid prn is ok otc Her symptom is globus sens and cough If no improvement or needs this often will let us know

## 2019-12-03 NOTE — Assessment & Plan Note (Signed)
bp in fair control at this time  BP Readings from Last 1 Encounters:  12/03/19 133/88   No changes needed Most recent labs reviewed  Disc lifstyle change with low sodium diet and exercise

## 2019-12-03 NOTE — Patient Instructions (Addendum)
For GERD - stay away from things in diet that bother it (like coffee)  Figure out what bothers you and avoid it  pepcid over the counter is ok short term  Don't eat before you lie down  If you are interested in the shingles vaccine series (Shingrix), call your insurance or pharmacy to check on coverage and location it must be given.  If affordable - you can schedule it here or at your pharmacy depending on coverage   For bone health  Try to get 1200-1500 mg of calcium per day with at least 1000 iu of vitamin D - for bone health (best to divide between 2 doses)

## 2019-12-03 NOTE — Assessment & Plan Note (Signed)
Reviewed health habits including diet and exercise and skin cancer prevention Reviewed appropriate screening tests for age  Also reviewed health mt list, fam hx and immunization status , as well as social and family history   See HPI Labs reviewed  Disc strategy for GERd ad anxiety symptoms  For gyn visit on Wednesday Mammogram scheduled next month  Enc her to get covid vaccines when able  Also discussed shingrix vaccine

## 2019-12-03 NOTE — Assessment & Plan Note (Signed)
Disc goals for lipids and reasons to control them Rev last labs with pt Rev low sat fat diet in detail Improved LDL with help of better eating/ nutritionist

## 2019-12-03 NOTE — Assessment & Plan Note (Signed)
Discussed how this problem influences overall health and the risks it imposes  Reviewed plan for weight loss with lower calorie diet (via better food choices and also portion control or program like weight watchers) and exercise building up to or more than 30 minutes 5 days per week including some aerobic activity   Commended on wt loss so far with exercise and nutritionist help

## 2019-12-05 ENCOUNTER — Encounter: Payer: Self-pay | Admitting: Obstetrics and Gynecology

## 2019-12-05 ENCOUNTER — Ambulatory Visit (INDEPENDENT_AMBULATORY_CARE_PROVIDER_SITE_OTHER): Payer: Federal, State, Local not specified - PPO | Admitting: Obstetrics and Gynecology

## 2019-12-05 ENCOUNTER — Other Ambulatory Visit: Payer: Self-pay

## 2019-12-05 VITALS — BP 161/90 | HR 83 | Ht 64.0 in | Wt 172.0 lb

## 2019-12-05 DIAGNOSIS — Z124 Encounter for screening for malignant neoplasm of cervix: Secondary | ICD-10-CM | POA: Diagnosis not present

## 2019-12-05 DIAGNOSIS — Z1151 Encounter for screening for human papillomavirus (HPV): Secondary | ICD-10-CM

## 2019-12-05 DIAGNOSIS — Z01419 Encounter for gynecological examination (general) (routine) without abnormal findings: Secondary | ICD-10-CM

## 2019-12-05 NOTE — Progress Notes (Signed)
Subjective:     [name] is a 59 y.o. female P2 with BMI 29.5 who is here for a comprehensive physical exam. The patient reports no problems. She is not sexually active. She denies any urinary incontinence. Patient denies any pelvic pain, abnormal bleeding or discharge.   Past Medical History:  Diagnosis Date  . Abnormal finding on Pap smear 2006   endometrial cells, biopsy ok  . Anxiety   . Fibroids    uterine  . Herpes zoster    mild case  . History of pelvic ultrasound 10/2007   uterine fibroids  . Hyperlipidemia   . Hypertension    with some white coat component   Past Surgical History:  Procedure Laterality Date  . COLONOSCOPY    . CRYOABLATION  1982   abnormal pap  . INTRAUTERINE DEVICE INSERTION  2006  . MENISCUS REPAIR Right 2002, 03/07/2015  . WISDOM TOOTH EXTRACTION     Family History  Problem Relation Age of Onset  . Glaucoma Father   . Heart disease Mother   . Heart attack Mother        stent  . Hyperlipidemia Mother   . Colon cancer Mother 17       deceased 2014-03-20  . Hypertension Mother   . Stroke Mother   . Goiter Daughter     Social History   Socioeconomic History  . Marital status: Single    Spouse name: Not on file  . Number of children: 2  . Years of education: Not on file  . Highest education level: Not on file  Occupational History  . Occupation: Aeronautical engineer: [employer]  Tobacco Use  . Smoking status: Never Smoker  . Smokeless tobacco: Never Used  Substance and Sexual Activity  . Alcohol use: No    Alcohol/week: 0.0 standard drinks  . Drug use: No  . Sexual activity: Not Currently    Partners: Male  Other Topics Concern  . Not on file  Social History Narrative   Exercises 4 x a week, cardio and strength training   Social Determinants of Health   Financial Resource Strain:   . Difficulty of Paying Living Expenses: Not on file  Food Insecurity:   . Worried About Charity fundraiser in the Last Year: Not on file  . Ran  Out of Food in the Last Year: Not on file  Transportation Needs:   . Lack of Transportation (Medical): Not on file  . Lack of Transportation (Non-Medical): Not on file  Physical Activity:   . Days of Exercise per Week: Not on file  . Minutes of Exercise per Session: Not on file  Stress:   . Feeling of Stress : Not on file  Social Connections:   . Frequency of Communication with Friends and Family: Not on file  . Frequency of Social Gatherings with Friends and Family: Not on file  . Attends Religious Services: Not on file  . Active Member of Clubs or Organizations: Not on file  . Attends Archivist Meetings: Not on file  . Marital Status: Not on file  Intimate Partner Violence:   . Fear of Current or Ex-Partner: Not on file  . Emotionally Abused: Not on file  . Physically Abused: Not on file  . Sexually Abused: Not on file   Health Maintenance  Topic Date Due  . MAMMOGRAM  11/21/2019  . Hepatitis C Screening  11/12/2020 (Originally January 31, 1961)  . HIV Screening  11/12/2020 (Originally 10/15/1973)  .  PAP SMEAR-Modifier  12/15/2020  . COLONOSCOPY  12/27/2021  . TETANUS/TDAP  10/10/2022  . INFLUENZA VACCINE  Completed       Review of Systems Pertinent items noted in HPI and remainder of comprehensive ROS otherwise negative.   Objective:  Blood pressure (!) 161/90, pulse 83, height 5\' 4"  (1.626 m), weight 172 lb (78 kg), last menstrual period 01/25/2012.     GENERAL: Well-developed, well-nourished female in no acute distress.  HEENT: Normocephalic, atraumatic. Sclerae anicteric.  NECK: Supple. Normal thyroid.  LUNGS: Clear to auscultation bilaterally.  HEART: Regular rate and rhythm. BREASTS: Symmetric in size. No palpable masses or lymphadenopathy, skin changes, or nipple drainage. ABDOMEN: Soft, nontender, nondistended. No organomegaly. PELVIC: Normal external female genitalia. Vagina is pale.  Normal discharge. Normal appearing cervix. Uterus is normal in size. No  adnexal mass or tenderness. EXTREMITIES: No cyanosis, clubbing, or edema, 2+ distal pulses.    Assessment:    Healthy female exam.      Plan:    Pap smear collected Screening mammogram scheduled in a few weeks Congratulated and encouraged patient to continue with her weight loss regimen which consists of cutting back on sugar and increasing her physical activity See After Visit Summary for Counseling Recommendations

## 2019-12-10 DIAGNOSIS — J301 Allergic rhinitis due to pollen: Secondary | ICD-10-CM | POA: Diagnosis not present

## 2019-12-10 DIAGNOSIS — J3081 Allergic rhinitis due to animal (cat) (dog) hair and dander: Secondary | ICD-10-CM | POA: Diagnosis not present

## 2019-12-10 DIAGNOSIS — J3089 Other allergic rhinitis: Secondary | ICD-10-CM | POA: Diagnosis not present

## 2019-12-11 LAB — CYTOLOGY - PAP
Comment: NEGATIVE
Diagnosis: NEGATIVE
High risk HPV: NEGATIVE

## 2019-12-17 DIAGNOSIS — J3081 Allergic rhinitis due to animal (cat) (dog) hair and dander: Secondary | ICD-10-CM | POA: Diagnosis not present

## 2019-12-17 DIAGNOSIS — J3089 Other allergic rhinitis: Secondary | ICD-10-CM | POA: Diagnosis not present

## 2019-12-17 DIAGNOSIS — J301 Allergic rhinitis due to pollen: Secondary | ICD-10-CM | POA: Diagnosis not present

## 2019-12-18 ENCOUNTER — Other Ambulatory Visit: Payer: Self-pay

## 2019-12-18 ENCOUNTER — Ambulatory Visit
Admission: RE | Admit: 2019-12-18 | Discharge: 2019-12-18 | Disposition: A | Discharge status: Home / Self Care | Payer: Federal, State, Local not specified - PPO | Source: Ambulatory Visit | Attending: Family Medicine | Admitting: Family Medicine

## 2019-12-18 DIAGNOSIS — Z1231 Encounter for screening mammogram for malignant neoplasm of breast: Secondary | ICD-10-CM | POA: Diagnosis not present

## 2019-12-21 ENCOUNTER — Ambulatory Visit: Payer: Federal, State, Local not specified - PPO | Attending: Internal Medicine

## 2019-12-21 DIAGNOSIS — J301 Allergic rhinitis due to pollen: Secondary | ICD-10-CM | POA: Diagnosis not present

## 2019-12-21 DIAGNOSIS — Z23 Encounter for immunization: Secondary | ICD-10-CM

## 2019-12-21 NOTE — Progress Notes (Signed)
   Covid-19 Vaccination Clinic  Name:  [name]    MRN: [id number] DOB: 1961-06-29  12/21/2019  [name] was observed post Covid-19 immunization for 30 minutes based on pre-vaccination screening without incident. She was provided with Vaccine Information Sheet and instruction to access the V-Safe system.   [name] was instructed to call 911 with any severe reactions post vaccine: Marland Kitchen Difficulty breathing  . Swelling of face and throat  . A fast heartbeat  . A bad rash all over body  . Dizziness and weakness   Immunizations Administered    Name Date Dose VIS Date Route   Pfizer COVID-19 Vaccine 12/21/2019  9:24 AM 0.3 mL 09/21/2019 Intramuscular   Manufacturer: [place]   Lot: [id number]   [place]: [id number]

## 2019-12-24 DIAGNOSIS — J3081 Allergic rhinitis due to animal (cat) (dog) hair and dander: Secondary | ICD-10-CM | POA: Diagnosis not present

## 2019-12-24 DIAGNOSIS — J3089 Other allergic rhinitis: Secondary | ICD-10-CM | POA: Diagnosis not present

## 2019-12-27 DIAGNOSIS — J3089 Other allergic rhinitis: Secondary | ICD-10-CM | POA: Diagnosis not present

## 2019-12-27 DIAGNOSIS — J301 Allergic rhinitis due to pollen: Secondary | ICD-10-CM | POA: Diagnosis not present

## 2019-12-27 DIAGNOSIS — J3081 Allergic rhinitis due to animal (cat) (dog) hair and dander: Secondary | ICD-10-CM | POA: Diagnosis not present

## 2019-12-31 DIAGNOSIS — J3089 Other allergic rhinitis: Secondary | ICD-10-CM | POA: Diagnosis not present

## 2019-12-31 DIAGNOSIS — J3081 Allergic rhinitis due to animal (cat) (dog) hair and dander: Secondary | ICD-10-CM | POA: Diagnosis not present

## 2019-12-31 DIAGNOSIS — J301 Allergic rhinitis due to pollen: Secondary | ICD-10-CM | POA: Diagnosis not present

## 2020-01-09 DIAGNOSIS — J3089 Other allergic rhinitis: Secondary | ICD-10-CM | POA: Diagnosis not present

## 2020-01-09 DIAGNOSIS — J3081 Allergic rhinitis due to animal (cat) (dog) hair and dander: Secondary | ICD-10-CM | POA: Diagnosis not present

## 2020-01-09 DIAGNOSIS — J301 Allergic rhinitis due to pollen: Secondary | ICD-10-CM | POA: Diagnosis not present

## 2020-01-15 ENCOUNTER — Ambulatory Visit: Payer: Federal, State, Local not specified - PPO

## 2020-01-15 ENCOUNTER — Ambulatory Visit: Payer: Federal, State, Local not specified - PPO | Attending: Internal Medicine

## 2020-01-15 DIAGNOSIS — Z23 Encounter for immunization: Secondary | ICD-10-CM

## 2020-01-15 NOTE — Progress Notes (Signed)
   Covid-19 Vaccination Clinic  Name:  [name]    MRN: [id number] DOB: 06-16-1961  01/15/2020  [name] was observed post Covid-19 immunization for 15 minutes without incident. She was provided with Vaccine Information Sheet and instruction to access the V-Safe system.   [name] was instructed to call 911 with any severe reactions post vaccine: Marland Kitchen Difficulty breathing  . Swelling of face and throat  . A fast heartbeat  . A bad rash all over body  . Dizziness and weakness   Immunizations Administered    Name Date Dose VIS Date Route   Pfizer COVID-19 Vaccine 01/15/2020  9:24 AM 0.3 mL 09/21/2019 Intramuscular   Manufacturer: [place]   Lot: [id number]   [place]: [id number]

## 2020-01-17 ENCOUNTER — Other Ambulatory Visit: Payer: Self-pay | Admitting: Family Medicine

## 2020-01-18 ENCOUNTER — Telehealth: Payer: Self-pay

## 2020-01-18 NOTE — Telephone Encounter (Signed)
Pt notified of Dr. Marliss Coots comments and recommendations and pt verbalized understanding

## 2020-01-18 NOTE — Telephone Encounter (Signed)
Can't sign note because it says Dr. Marliss Coots note is "incomplete" will route to Dr. Glori Bickers to see if she can complete her note

## 2020-01-18 NOTE — Telephone Encounter (Signed)
Pt said that on 01/15/20 pt got 2nd pfizer covid vaccine; pt still has a lot of fatigue, last night temp was 99, on 01/17/20 watery diarrhea started but today is having a little form to BM. No other symptoms; pt does not want to do a virtual and wants cb after Dr Glori Bickers reviews this note. UC & ED precautions given and pt voiced understanding. Also advised pt to drink plenty of liquids, take Tylenol for fever and body aches and get rest. [facility].

## 2020-01-18 NOTE — Telephone Encounter (Signed)
This sounds like a vaccine reaction - not terribly uncommon. Tylenol for aches and temp are ok.  Over the counter med like pepto is ok for diarrhea (keep hydrated)  If symptoms become severe let me know  If no improvement over the weekend let us know also   Aware

## 2020-01-21 NOTE — Telephone Encounter (Signed)
Any diarrhea or just cramping? Is it slowing down at all? Is nausea completely gone?  Any blood in stool?

## 2020-01-21 NOTE — Telephone Encounter (Signed)
Pt said diarrhea resolved after taking pepto she is just dealing with abd cramping now, it seems like it's a little better, at 1st it was really constant cramping and now she said it only comes right after she eats or drinks something. Pt said she has no nausea and no more vomiting except that one time she mentioned in prev message. Pt said she has no blood in stool and hasn't had any dark tary looking stools either. Please advise

## 2020-01-21 NOTE — Telephone Encounter (Signed)
Since the diarrhea has slowed down- back off the pepto just in case that is intensifying cramping.  Stay hydrated and eat bland and please update [place] tomorrow with how you are feeling.  If symptoms suddenly become severe do go to the ER

## 2020-01-21 NOTE — Telephone Encounter (Signed)
Pt said she got 2nd covid vaccine on 01/15/20. On 01/18/20 at night pt began with below waistline stomach cramping. Pt said the H/A,fatigue and low grade fever went away over the weekend but the lower abd cramping continues. Pt said the pepto helped the diarrhea. Pt did vomit x 1 over weekend. Pt continues with stomach cramping all the way across the lower abdomen today on and off and when pt has stomach cramping the pain level is 4-5. Pt wants to verify this is a side effect of the covid vaccine and should pt continue the pepto for the abd cramping; pt though she would be over covid side effects by now.pt request cb. [facility]

## 2020-01-21 NOTE — Telephone Encounter (Signed)
Pt notified of Dr. Marliss Coots instructions and verbalized understanding she will update [place] tomorrow

## 2020-01-23 DIAGNOSIS — J3081 Allergic rhinitis due to animal (cat) (dog) hair and dander: Secondary | ICD-10-CM | POA: Diagnosis not present

## 2020-01-23 DIAGNOSIS — J301 Allergic rhinitis due to pollen: Secondary | ICD-10-CM | POA: Diagnosis not present

## 2020-01-23 DIAGNOSIS — J3089 Other allergic rhinitis: Secondary | ICD-10-CM | POA: Diagnosis not present

## 2020-01-23 NOTE — Telephone Encounter (Signed)
Pt notified of Dr. Tower's comments and f/u appt scheduled tomorrow  

## 2020-01-23 NOTE — Telephone Encounter (Signed)
I'm glad this is improving slowly.  Please schedule an appt if no further improvement tomorrow and let me know if suddenly worse

## 2020-01-23 NOTE — Telephone Encounter (Signed)
Pt left v/m that pt abd cramping is not as bad as has been but still has some abd cramping. I spoke with pt and the abd cramping is not as intense as was but pt is aware the abd cramping comes and goes. The abd cramping pain level now is 4-5. Last episode of abd cramping was few mins ago and lasted few seconds. Pt has not seen any blood. No N&V. No fever. Pt said now cannot relate abd cramping to eating or drinking; the cramping can come anytime. Pt request cb after Dr Glori Bickers reviews note.

## 2020-01-24 ENCOUNTER — Encounter: Payer: Self-pay | Admitting: Family Medicine

## 2020-01-24 ENCOUNTER — Other Ambulatory Visit: Payer: Self-pay

## 2020-01-24 ENCOUNTER — Ambulatory Visit: Payer: Federal, State, Local not specified - PPO | Admitting: Family Medicine

## 2020-01-24 DIAGNOSIS — R109 Unspecified abdominal pain: Secondary | ICD-10-CM | POA: Insufficient documentation

## 2020-01-24 NOTE — Progress Notes (Signed)
Subjective:    Patient ID: [name], female    DOB: [birth year], 59 y.o.   MRN: [id number]  This visit occurred during the SARS-CoV-2 public health emergency.  Safety protocols were in place, including screening questions prior to the visit, additional usage of staff PPE, and extensive cleaning of exam room while observing appropriate contact time as indicated for disinfecting solutions.    HPI  Pt presents with abdominal cramping /GI issue   Wt Readings from Last 3 Encounters:  01/24/20 166 lb 4 oz (75.4 kg)  12/05/19 172 lb (78 kg)  12/03/19 170 lb 1 oz (77.1 kg)   27.88 kg/m   Had her 2nd pfizer covid vaccine 01/15/20- then side effects that evening  Started with headache and fatigue (not severe)  Used excedrin migraine  Did not take temp for several days - low grade 2 days later with chills and some aches  Thursday- started diarrhea at night - not severe /tired/headache-unsure if she got enough fluids  That evening started eating some (soup)  Abdominal cramps /nausea  Vomited once and that helped  Lots of bubbling/cramping- under her umbilicus   Diarrhea is better  Cramping is sporadic and gradually getting better (slept better last night)  No more headache or temp  Ate a little yogurt today-cramped a bit after that  bm this am-normal  Patient Active Problem List   Diagnosis Date Noted  . Abdominal cramping 01/24/2020  . GERD (gastroesophageal reflux disease) 12/03/2019  . Allergic rhinitis 08/02/2019  . Post-nasal drip 08/02/2019  . Rhinorrhea 11/15/2016  . Overweight (BMI 25.0-29.9) 11/15/2016  . Family history of colon cancer 05/14/2015  . Travel advice encounter 01/31/2015  . Routine general medical examination at a health care facility 10/01/2012  . ESSENTIAL HYPERTENSION, BENIGN 01/29/2010  . Hyperlipidemia 10/09/2007  . OSTEOARTHRITIS, KNEE 10/09/2007   Past Medical History:  Diagnosis Date  . Abnormal finding on Pap smear 2006   endometrial cells,  biopsy ok  . Anxiety   . Fibroids    uterine  . Herpes zoster    mild case  . History of pelvic ultrasound 10/2007   uterine fibroids  . Hyperlipidemia   . Hypertension    with some white coat component   Past Surgical History:  Procedure Laterality Date  . COLONOSCOPY    . CRYOABLATION  1982   abnormal pap  . INTRAUTERINE DEVICE INSERTION  2006  . MENISCUS REPAIR Right 2002, 03/07/2015  . WISDOM TOOTH EXTRACTION     Social History   Tobacco Use  . Smoking status: Never Smoker  . Smokeless tobacco: Never Used  Substance Use Topics  . Alcohol use: No    Alcohol/week: 0.0 standard drinks  . Drug use: No   Family History  Problem Relation Age of Onset  . Glaucoma Father   . Heart disease Mother   . Heart attack Mother        stent  . Hyperlipidemia Mother   . Colon cancer Mother 41       deceased March 27, 2014  . Hypertension Mother   . Stroke Mother   . Goiter Daughter    No Known Allergies Current Outpatient Medications on File Prior to Visit  Medication Sig Dispense Refill  . azelastine (ASTELIN) 0.1 % nasal spray Place into both nostrils 2 (two) times daily. Use in each nostril as directed    . hydrochlorothiazide (MICROZIDE) 12.5 MG capsule Take 1 capsule (12.5 mg total) by mouth every morning. 90 capsule 3  .  ipratropium (ATROVENT) 0.06 % nasal spray     . loratadine (CLARITIN) 10 MG tablet Take 10 mg by mouth daily.    . montelukast (SINGULAIR) 10 MG tablet Take 10 mg by mouth at bedtime.    . Multiple Vitamin (MULTIVITAMIN) capsule Take 1 capsule by mouth daily.       No current facility-administered medications on file prior to visit.    Review of Systems  Constitutional: Positive for fatigue. Negative for activity change, appetite change, fever and unexpected weight change.  HENT: Negative for congestion, ear pain, rhinorrhea, sinus pressure and sore throat.   Eyes: Negative for pain, redness and visual disturbance.  Respiratory: Negative for cough, shortness  of breath and wheezing.   Cardiovascular: Negative for chest pain and palpitations.  Gastrointestinal: Positive for abdominal pain. Negative for abdominal distention, anal bleeding, blood in stool, constipation, diarrhea, nausea, rectal pain and vomiting.  Endocrine: Negative for polydipsia and polyuria.  Genitourinary: Negative for dysuria, frequency and urgency.  Musculoskeletal: Negative for arthralgias, back pain and myalgias.  Skin: Negative for pallor and rash.  Allergic/Immunologic: Negative for environmental allergies.  Neurological: Negative for dizziness, syncope and headaches.  Hematological: Negative for adenopathy. Does not bruise/bleed easily.  Psychiatric/Behavioral: Negative for decreased concentration and dysphoric mood. The patient is not nervous/anxious.        Objective:   Physical Exam Constitutional:      General: She is not in acute distress.    Appearance: Normal appearance. She is well-developed and normal weight. She is not ill-appearing or diaphoretic.  HENT:     Head: Normocephalic and atraumatic.  Eyes:     General: No scleral icterus.    Conjunctiva/sclera: Conjunctivae normal.     Pupils: Pupils are equal, round, and reactive to light.  Cardiovascular:     Rate and Rhythm: Normal rate and regular rhythm.     Heart sounds: Normal heart sounds.  Pulmonary:     Effort: Pulmonary effort is normal. No respiratory distress.     Breath sounds: Normal breath sounds. No wheezing or rales.  Abdominal:     General: Bowel sounds are normal. There is no distension.     Palpations: Abdomen is soft. There is no mass.     Tenderness: There is no abdominal tenderness. There is no guarding or rebound.     Hernia: No hernia is present.  Musculoskeletal:     Cervical back: Normal range of motion and neck supple.  Lymphadenopathy:     Cervical: No cervical adenopathy.  Skin:    General: Skin is warm and dry.     Coloration: Skin is not pale.     Findings: No  erythema or rash.     Comments: Nl skin color and tugor Brisk capillary refill   Neurological:     Mental Status: She is alert.     Sensory: No sensory deficit.  Psychiatric:        Mood and Affect: Mood normal.           Assessment & Plan:   Problem List Items Addressed This Visit      Other   Abdominal cramping    As part as of an immunization reaction to the pfizer covid vaccines  Improved (originally with diarrhea/fever and headache) Nl exam Enc to very gradually advance diet after starting with BrAT Work on fluid rehydration and electrolyte replacement  inst to call if she develops worse cramping or return of diarrhea or fever

## 2020-01-24 NOTE — Patient Instructions (Addendum)
Aim for 64 oz of fluids per day-mostly water  pedialyte or gatorade can supplement as well for electrolytes   If diarrhea comes back or cramps worsen or other new symptoms let us know  Resume diet very gradually - bland/ small portions  Bananas/rice/applesauce/toast

## 2020-01-24 NOTE — Assessment & Plan Note (Signed)
As part as of an immunization reaction to the [employer] covid vaccines  Improved (originally with diarrhea/fever and headache) Nl exam Enc to very gradually advance diet after starting with BrAT Work on fluid rehydration and electrolyte replacement  inst to call if she develops worse cramping or return of diarrhea or fever

## 2020-01-28 DIAGNOSIS — J3081 Allergic rhinitis due to animal (cat) (dog) hair and dander: Secondary | ICD-10-CM | POA: Diagnosis not present

## 2020-01-28 DIAGNOSIS — J3089 Other allergic rhinitis: Secondary | ICD-10-CM | POA: Diagnosis not present

## 2020-01-28 DIAGNOSIS — J301 Allergic rhinitis due to pollen: Secondary | ICD-10-CM | POA: Diagnosis not present

## 2020-02-05 DIAGNOSIS — J301 Allergic rhinitis due to pollen: Secondary | ICD-10-CM | POA: Diagnosis not present

## 2020-02-05 DIAGNOSIS — J3081 Allergic rhinitis due to animal (cat) (dog) hair and dander: Secondary | ICD-10-CM | POA: Diagnosis not present

## 2020-02-05 DIAGNOSIS — J3089 Other allergic rhinitis: Secondary | ICD-10-CM | POA: Diagnosis not present

## 2020-02-12 DIAGNOSIS — J301 Allergic rhinitis due to pollen: Secondary | ICD-10-CM | POA: Diagnosis not present

## 2020-02-12 DIAGNOSIS — J3081 Allergic rhinitis due to animal (cat) (dog) hair and dander: Secondary | ICD-10-CM | POA: Diagnosis not present

## 2020-02-12 DIAGNOSIS — J3089 Other allergic rhinitis: Secondary | ICD-10-CM | POA: Diagnosis not present

## 2020-02-19 DIAGNOSIS — J3081 Allergic rhinitis due to animal (cat) (dog) hair and dander: Secondary | ICD-10-CM | POA: Diagnosis not present

## 2020-02-19 DIAGNOSIS — J3089 Other allergic rhinitis: Secondary | ICD-10-CM | POA: Diagnosis not present

## 2020-02-19 DIAGNOSIS — J301 Allergic rhinitis due to pollen: Secondary | ICD-10-CM | POA: Diagnosis not present

## 2020-02-25 DIAGNOSIS — J301 Allergic rhinitis due to pollen: Secondary | ICD-10-CM | POA: Diagnosis not present

## 2020-02-25 DIAGNOSIS — J3081 Allergic rhinitis due to animal (cat) (dog) hair and dander: Secondary | ICD-10-CM | POA: Diagnosis not present

## 2020-02-25 DIAGNOSIS — J3089 Other allergic rhinitis: Secondary | ICD-10-CM | POA: Diagnosis not present

## 2020-03-03 DIAGNOSIS — J3089 Other allergic rhinitis: Secondary | ICD-10-CM | POA: Diagnosis not present

## 2020-03-03 DIAGNOSIS — J301 Allergic rhinitis due to pollen: Secondary | ICD-10-CM | POA: Diagnosis not present

## 2020-03-03 DIAGNOSIS — J3081 Allergic rhinitis due to animal (cat) (dog) hair and dander: Secondary | ICD-10-CM | POA: Diagnosis not present

## 2020-03-12 DIAGNOSIS — J3081 Allergic rhinitis due to animal (cat) (dog) hair and dander: Secondary | ICD-10-CM | POA: Diagnosis not present

## 2020-03-12 DIAGNOSIS — J301 Allergic rhinitis due to pollen: Secondary | ICD-10-CM | POA: Diagnosis not present

## 2020-03-12 DIAGNOSIS — J3089 Other allergic rhinitis: Secondary | ICD-10-CM | POA: Diagnosis not present

## 2020-03-17 DIAGNOSIS — J301 Allergic rhinitis due to pollen: Secondary | ICD-10-CM | POA: Diagnosis not present

## 2020-03-17 DIAGNOSIS — J3081 Allergic rhinitis due to animal (cat) (dog) hair and dander: Secondary | ICD-10-CM | POA: Diagnosis not present

## 2020-03-17 DIAGNOSIS — J3089 Other allergic rhinitis: Secondary | ICD-10-CM | POA: Diagnosis not present

## 2020-03-25 DIAGNOSIS — J301 Allergic rhinitis due to pollen: Secondary | ICD-10-CM | POA: Diagnosis not present

## 2020-03-25 DIAGNOSIS — J3089 Other allergic rhinitis: Secondary | ICD-10-CM | POA: Diagnosis not present

## 2020-03-25 DIAGNOSIS — J3081 Allergic rhinitis due to animal (cat) (dog) hair and dander: Secondary | ICD-10-CM | POA: Diagnosis not present

## 2020-03-28 DIAGNOSIS — J3081 Allergic rhinitis due to animal (cat) (dog) hair and dander: Secondary | ICD-10-CM | POA: Diagnosis not present

## 2020-03-28 DIAGNOSIS — J301 Allergic rhinitis due to pollen: Secondary | ICD-10-CM | POA: Diagnosis not present

## 2020-03-28 DIAGNOSIS — J3089 Other allergic rhinitis: Secondary | ICD-10-CM | POA: Diagnosis not present

## 2020-03-28 DIAGNOSIS — H1045 Other chronic allergic conjunctivitis: Secondary | ICD-10-CM | POA: Diagnosis not present

## 2020-04-01 DIAGNOSIS — J3081 Allergic rhinitis due to animal (cat) (dog) hair and dander: Secondary | ICD-10-CM | POA: Diagnosis not present

## 2020-04-01 DIAGNOSIS — J3089 Other allergic rhinitis: Secondary | ICD-10-CM | POA: Diagnosis not present

## 2020-04-01 DIAGNOSIS — J301 Allergic rhinitis due to pollen: Secondary | ICD-10-CM | POA: Diagnosis not present

## 2020-04-07 DIAGNOSIS — J301 Allergic rhinitis due to pollen: Secondary | ICD-10-CM | POA: Diagnosis not present

## 2020-04-07 DIAGNOSIS — J3081 Allergic rhinitis due to animal (cat) (dog) hair and dander: Secondary | ICD-10-CM | POA: Diagnosis not present

## 2020-04-07 DIAGNOSIS — J3089 Other allergic rhinitis: Secondary | ICD-10-CM | POA: Diagnosis not present

## 2020-04-15 DIAGNOSIS — J3089 Other allergic rhinitis: Secondary | ICD-10-CM | POA: Diagnosis not present

## 2020-04-15 DIAGNOSIS — J301 Allergic rhinitis due to pollen: Secondary | ICD-10-CM | POA: Diagnosis not present

## 2020-04-15 DIAGNOSIS — J3081 Allergic rhinitis due to animal (cat) (dog) hair and dander: Secondary | ICD-10-CM | POA: Diagnosis not present

## 2020-04-21 DIAGNOSIS — J3081 Allergic rhinitis due to animal (cat) (dog) hair and dander: Secondary | ICD-10-CM | POA: Diagnosis not present

## 2020-04-21 DIAGNOSIS — J3089 Other allergic rhinitis: Secondary | ICD-10-CM | POA: Diagnosis not present

## 2020-04-21 DIAGNOSIS — J301 Allergic rhinitis due to pollen: Secondary | ICD-10-CM | POA: Diagnosis not present

## 2020-04-29 DIAGNOSIS — J3081 Allergic rhinitis due to animal (cat) (dog) hair and dander: Secondary | ICD-10-CM | POA: Diagnosis not present

## 2020-04-29 DIAGNOSIS — J3089 Other allergic rhinitis: Secondary | ICD-10-CM | POA: Diagnosis not present

## 2020-04-29 DIAGNOSIS — J301 Allergic rhinitis due to pollen: Secondary | ICD-10-CM | POA: Diagnosis not present

## 2020-05-05 DIAGNOSIS — J301 Allergic rhinitis due to pollen: Secondary | ICD-10-CM | POA: Diagnosis not present

## 2020-05-05 DIAGNOSIS — J3089 Other allergic rhinitis: Secondary | ICD-10-CM | POA: Diagnosis not present

## 2020-05-05 DIAGNOSIS — J3081 Allergic rhinitis due to animal (cat) (dog) hair and dander: Secondary | ICD-10-CM | POA: Diagnosis not present

## 2020-05-12 DIAGNOSIS — J3081 Allergic rhinitis due to animal (cat) (dog) hair and dander: Secondary | ICD-10-CM | POA: Diagnosis not present

## 2020-05-12 DIAGNOSIS — J301 Allergic rhinitis due to pollen: Secondary | ICD-10-CM | POA: Diagnosis not present

## 2020-05-12 DIAGNOSIS — J3089 Other allergic rhinitis: Secondary | ICD-10-CM | POA: Diagnosis not present

## 2020-05-20 DIAGNOSIS — J3089 Other allergic rhinitis: Secondary | ICD-10-CM | POA: Diagnosis not present

## 2020-05-20 DIAGNOSIS — J301 Allergic rhinitis due to pollen: Secondary | ICD-10-CM | POA: Diagnosis not present

## 2020-05-26 DIAGNOSIS — J3081 Allergic rhinitis due to animal (cat) (dog) hair and dander: Secondary | ICD-10-CM | POA: Diagnosis not present

## 2020-05-26 DIAGNOSIS — J301 Allergic rhinitis due to pollen: Secondary | ICD-10-CM | POA: Diagnosis not present

## 2020-05-26 DIAGNOSIS — J3089 Other allergic rhinitis: Secondary | ICD-10-CM | POA: Diagnosis not present

## 2020-06-03 DIAGNOSIS — J301 Allergic rhinitis due to pollen: Secondary | ICD-10-CM | POA: Diagnosis not present

## 2020-06-03 DIAGNOSIS — J3081 Allergic rhinitis due to animal (cat) (dog) hair and dander: Secondary | ICD-10-CM | POA: Diagnosis not present

## 2020-06-03 DIAGNOSIS — J3089 Other allergic rhinitis: Secondary | ICD-10-CM | POA: Diagnosis not present

## 2020-06-09 DIAGNOSIS — J301 Allergic rhinitis due to pollen: Secondary | ICD-10-CM | POA: Diagnosis not present

## 2020-06-09 DIAGNOSIS — J3089 Other allergic rhinitis: Secondary | ICD-10-CM | POA: Diagnosis not present

## 2020-06-09 DIAGNOSIS — J3081 Allergic rhinitis due to animal (cat) (dog) hair and dander: Secondary | ICD-10-CM | POA: Diagnosis not present

## 2020-06-19 DIAGNOSIS — J301 Allergic rhinitis due to pollen: Secondary | ICD-10-CM | POA: Diagnosis not present

## 2020-06-19 DIAGNOSIS — J3089 Other allergic rhinitis: Secondary | ICD-10-CM | POA: Diagnosis not present

## 2020-06-19 DIAGNOSIS — J3081 Allergic rhinitis due to animal (cat) (dog) hair and dander: Secondary | ICD-10-CM | POA: Diagnosis not present

## 2020-06-26 DIAGNOSIS — J3089 Other allergic rhinitis: Secondary | ICD-10-CM | POA: Diagnosis not present

## 2020-06-26 DIAGNOSIS — J301 Allergic rhinitis due to pollen: Secondary | ICD-10-CM | POA: Diagnosis not present

## 2020-06-26 DIAGNOSIS — J3081 Allergic rhinitis due to animal (cat) (dog) hair and dander: Secondary | ICD-10-CM | POA: Diagnosis not present

## 2020-07-01 DIAGNOSIS — J3081 Allergic rhinitis due to animal (cat) (dog) hair and dander: Secondary | ICD-10-CM | POA: Diagnosis not present

## 2020-07-01 DIAGNOSIS — J301 Allergic rhinitis due to pollen: Secondary | ICD-10-CM | POA: Diagnosis not present

## 2020-07-01 DIAGNOSIS — J3089 Other allergic rhinitis: Secondary | ICD-10-CM | POA: Diagnosis not present

## 2020-07-08 DIAGNOSIS — J3081 Allergic rhinitis due to animal (cat) (dog) hair and dander: Secondary | ICD-10-CM | POA: Diagnosis not present

## 2020-07-08 DIAGNOSIS — J3089 Other allergic rhinitis: Secondary | ICD-10-CM | POA: Diagnosis not present

## 2020-07-08 DIAGNOSIS — J301 Allergic rhinitis due to pollen: Secondary | ICD-10-CM | POA: Diagnosis not present

## 2020-07-14 DIAGNOSIS — J3081 Allergic rhinitis due to animal (cat) (dog) hair and dander: Secondary | ICD-10-CM | POA: Diagnosis not present

## 2020-07-14 DIAGNOSIS — J3089 Other allergic rhinitis: Secondary | ICD-10-CM | POA: Diagnosis not present

## 2020-07-14 DIAGNOSIS — J301 Allergic rhinitis due to pollen: Secondary | ICD-10-CM | POA: Diagnosis not present

## 2020-07-22 DIAGNOSIS — J3081 Allergic rhinitis due to animal (cat) (dog) hair and dander: Secondary | ICD-10-CM | POA: Diagnosis not present

## 2020-07-22 DIAGNOSIS — Z23 Encounter for immunization: Secondary | ICD-10-CM | POA: Diagnosis not present

## 2020-07-22 DIAGNOSIS — J301 Allergic rhinitis due to pollen: Secondary | ICD-10-CM | POA: Diagnosis not present

## 2020-07-22 DIAGNOSIS — J3089 Other allergic rhinitis: Secondary | ICD-10-CM | POA: Diagnosis not present

## 2020-07-28 DIAGNOSIS — J301 Allergic rhinitis due to pollen: Secondary | ICD-10-CM | POA: Diagnosis not present

## 2020-07-28 DIAGNOSIS — J3089 Other allergic rhinitis: Secondary | ICD-10-CM | POA: Diagnosis not present

## 2020-07-28 DIAGNOSIS — J3081 Allergic rhinitis due to animal (cat) (dog) hair and dander: Secondary | ICD-10-CM | POA: Diagnosis not present

## 2020-08-04 DIAGNOSIS — J3081 Allergic rhinitis due to animal (cat) (dog) hair and dander: Secondary | ICD-10-CM | POA: Diagnosis not present

## 2020-08-04 DIAGNOSIS — J3089 Other allergic rhinitis: Secondary | ICD-10-CM | POA: Diagnosis not present

## 2020-08-04 DIAGNOSIS — J301 Allergic rhinitis due to pollen: Secondary | ICD-10-CM | POA: Diagnosis not present

## 2020-08-06 DIAGNOSIS — H40013 Open angle with borderline findings, low risk, bilateral: Secondary | ICD-10-CM | POA: Diagnosis not present

## 2020-08-07 IMAGING — MG DIGITAL DIAGNOSTIC BILATERAL MAMMOGRAM WITH TOMO AND CAD
8 series · 8 of 24 positions shown · non-contrast
Comparison: Previous exam(s).

CLINICAL DATA: 58-year-old patient presents for 1 year follow-up
probably benign fibrocystic findings in the left breast and annual
examination of both breasts.

EXAM:
DIGITAL DIAGNOSTIC BILATERAL MAMMOGRAM WITH CAD AND TOMO
ULTRASOUND LEFT BREAST

[L CC synth-2D]
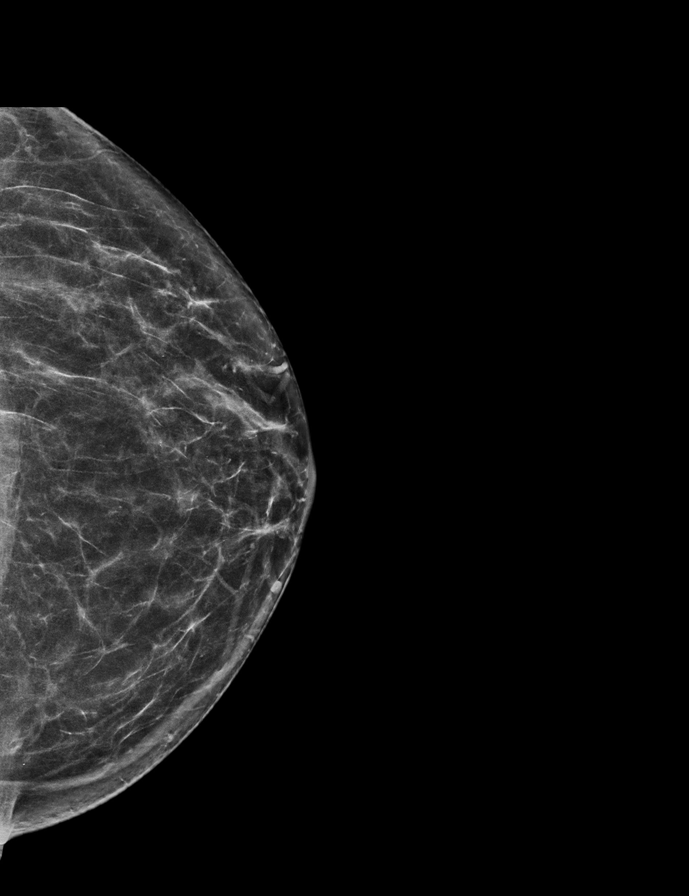

[R CC synth-2D]
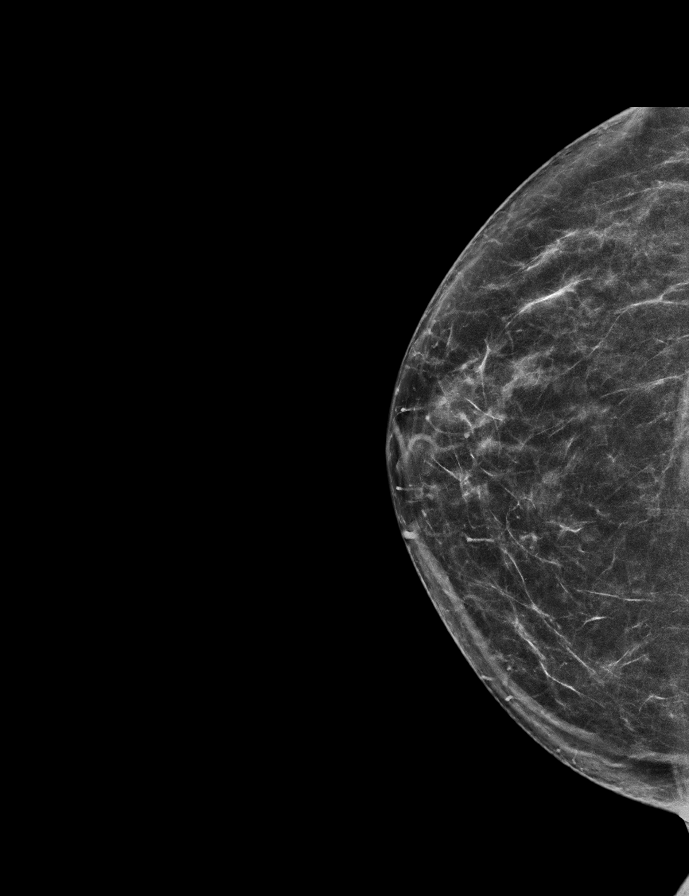

[R MLO synth-2D]
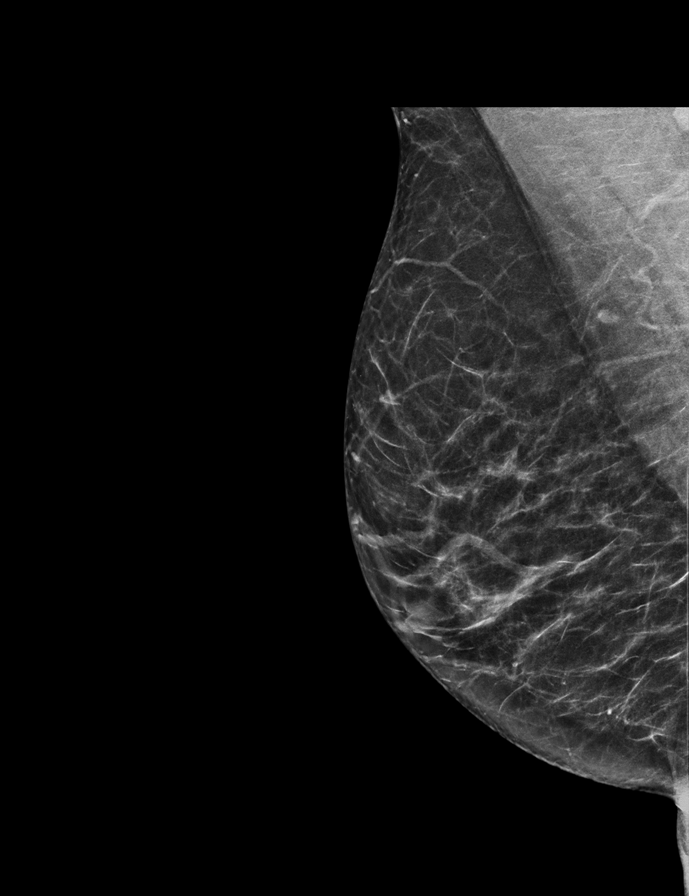

[L MLO synth-2D]
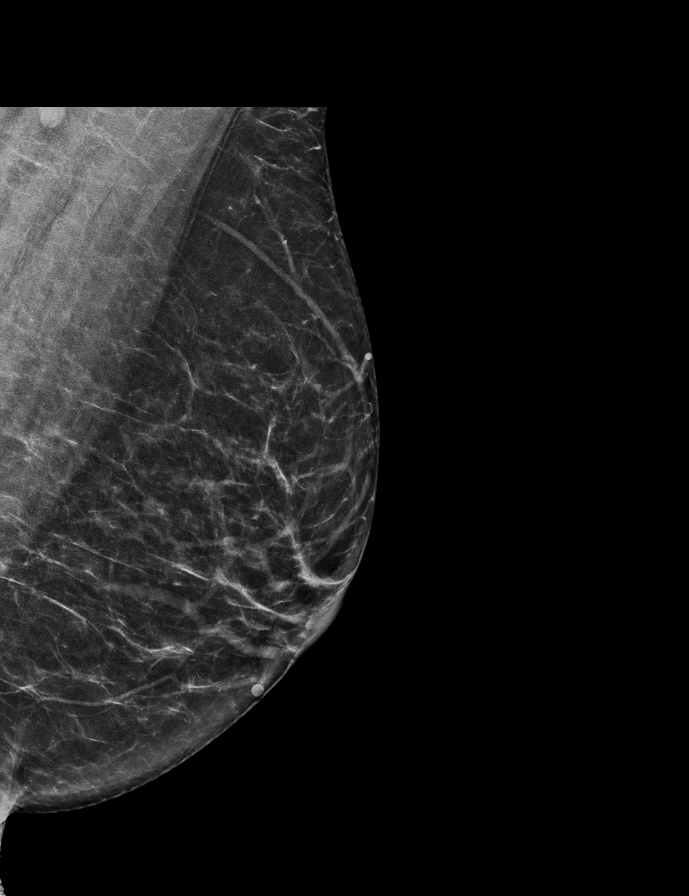

[R MLO tomo · tomo slice 29/58.0]
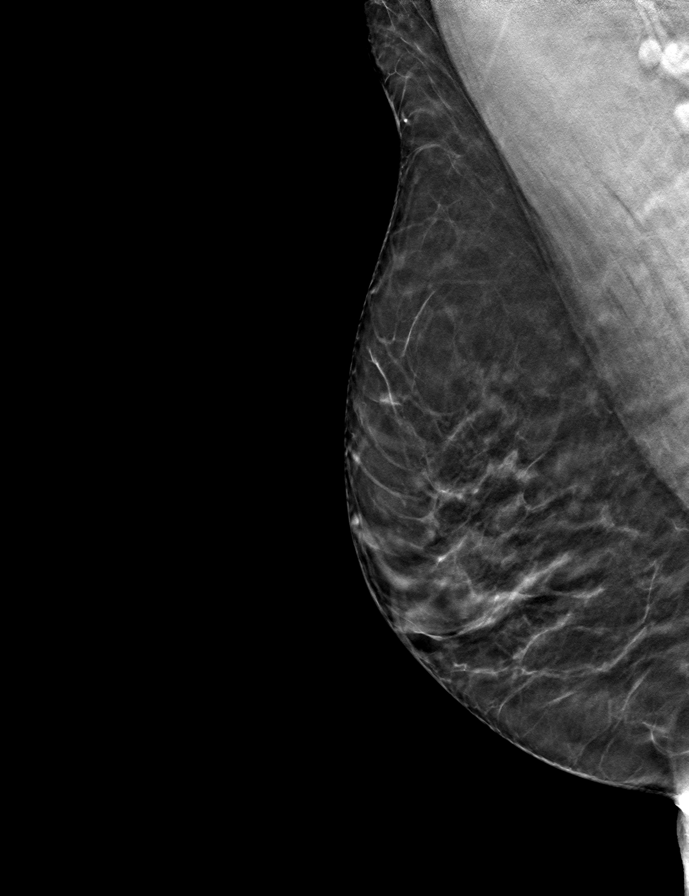

[R CC tomo · tomo slice 29/58.0]
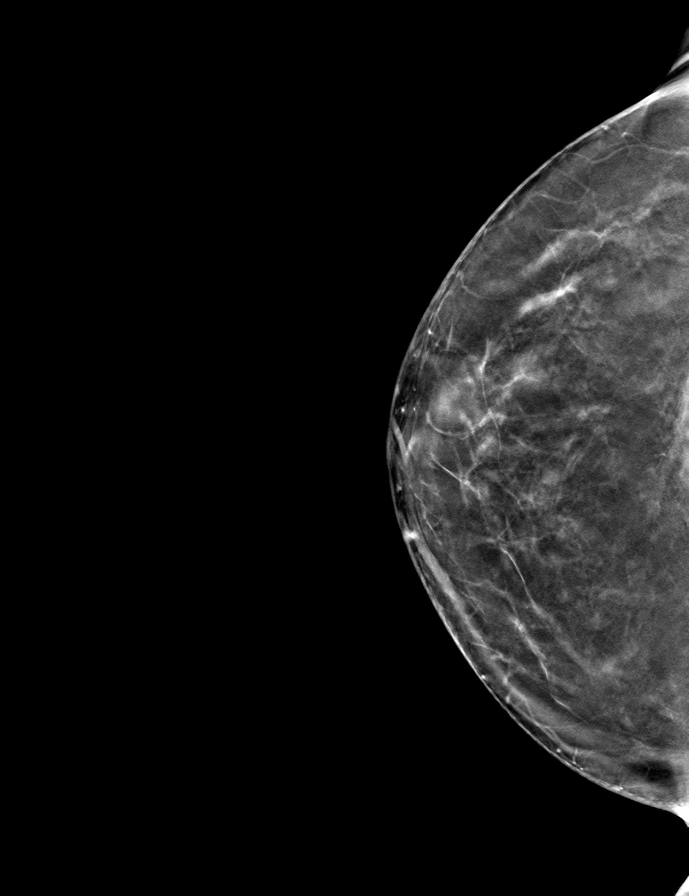

[L CC tomo · tomo slice 31/61.0]
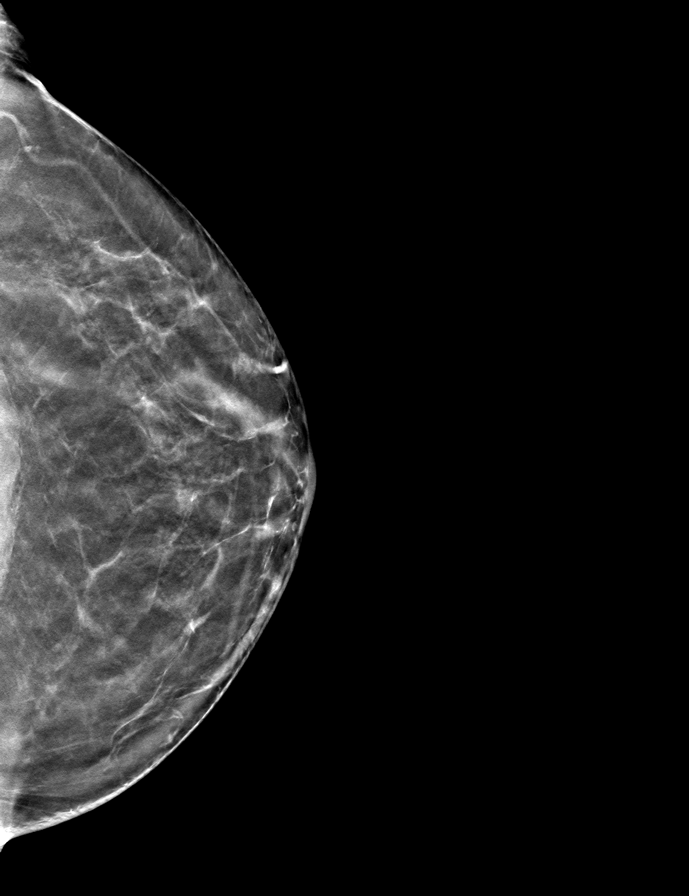

[L MLO tomo · tomo slice 30/59.0]
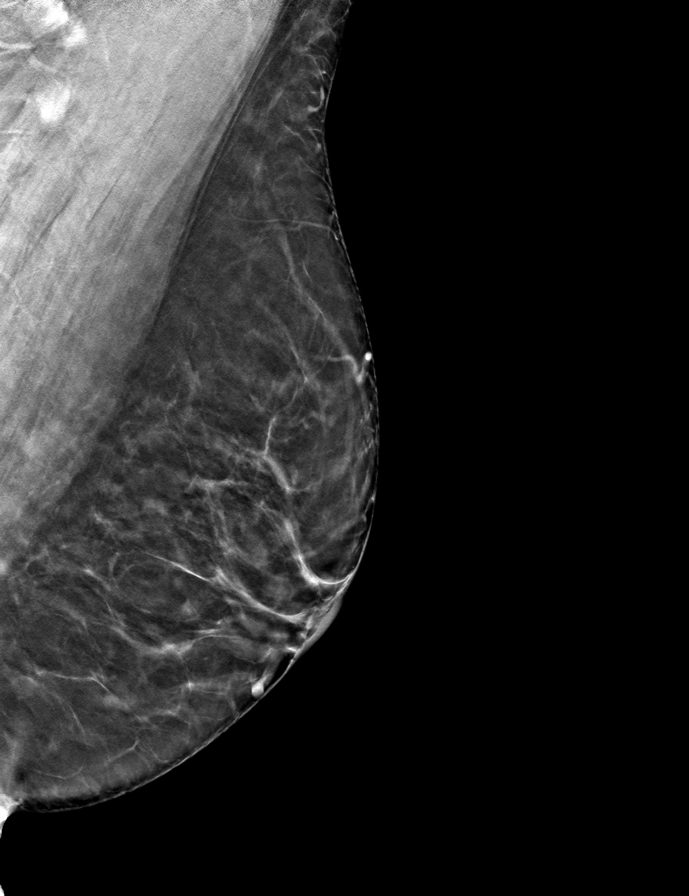

[8 of 24 positions shown; findings below may reference images not displayed]

ACR Breast Density Category b: There are scattered areas of
fibroglandular density.
FINDINGS: No suspicious mass, architectural distortion, or suspicious
microcalcification is identified in either breast to suggest
malignancy. Previously identified asymmetry in the medial left
breast is stable to slightly smaller compared to the mammogram dated
November 16, 2017. No new or suspicious mass, architectural
distortion, or suspicious microcalcification is identified in either
breast to suggest malignancy.

Mammographic images were processed with CAD.

Targeted ultrasound is performed, showing a cluster of cysts at 8
o'clock position 1 cm from the nipple measuring 0.5 x 0.5 x 0.6 cm.
There is no internal vascular flow. There is posterior acoustic
enhancement. This cluster of cysts measures slightly smaller today
than it did November 16, 2017.
IMPRESSION: Slight decrease in size in the benign cluster of cysts in the 8
o'clock position of the left breast. No evidence of malignancy
bilaterally.

RECOMMENDATION:
Screening mammogram in one year.(Code:[id number]-[id number])

I have discussed the findings and recommendations with the patient.
Results were also provided in writing at the conclusion of the
visit. If applicable, a reminder letter will be sent to the patient
regarding the next appointment.

BI-RADS CATEGORY  2: Benign.

## 2020-08-11 DIAGNOSIS — J3081 Allergic rhinitis due to animal (cat) (dog) hair and dander: Secondary | ICD-10-CM | POA: Diagnosis not present

## 2020-08-11 DIAGNOSIS — J3089 Other allergic rhinitis: Secondary | ICD-10-CM | POA: Diagnosis not present

## 2020-08-11 DIAGNOSIS — J301 Allergic rhinitis due to pollen: Secondary | ICD-10-CM | POA: Diagnosis not present

## 2020-08-14 DIAGNOSIS — J301 Allergic rhinitis due to pollen: Secondary | ICD-10-CM | POA: Diagnosis not present

## 2020-08-15 DIAGNOSIS — J3089 Other allergic rhinitis: Secondary | ICD-10-CM | POA: Diagnosis not present

## 2020-08-15 DIAGNOSIS — J3081 Allergic rhinitis due to animal (cat) (dog) hair and dander: Secondary | ICD-10-CM | POA: Diagnosis not present

## 2020-08-18 DIAGNOSIS — J3081 Allergic rhinitis due to animal (cat) (dog) hair and dander: Secondary | ICD-10-CM | POA: Diagnosis not present

## 2020-08-18 DIAGNOSIS — J301 Allergic rhinitis due to pollen: Secondary | ICD-10-CM | POA: Diagnosis not present

## 2020-08-18 DIAGNOSIS — J3089 Other allergic rhinitis: Secondary | ICD-10-CM | POA: Diagnosis not present

## 2020-08-25 DIAGNOSIS — J301 Allergic rhinitis due to pollen: Secondary | ICD-10-CM | POA: Diagnosis not present

## 2020-08-25 DIAGNOSIS — J3089 Other allergic rhinitis: Secondary | ICD-10-CM | POA: Diagnosis not present

## 2020-08-25 DIAGNOSIS — J3081 Allergic rhinitis due to animal (cat) (dog) hair and dander: Secondary | ICD-10-CM | POA: Diagnosis not present

## 2020-08-29 ENCOUNTER — Ambulatory Visit: Payer: Federal, State, Local not specified - PPO | Attending: Internal Medicine

## 2020-08-29 DIAGNOSIS — Z23 Encounter for immunization: Secondary | ICD-10-CM

## 2020-08-29 NOTE — Progress Notes (Signed)
   Covid-19 Vaccination Clinic  Name:  [name]    MRN: [id number] DOB: 10/21/1960  08/29/2020  [name] was observed post Covid-19 immunization for 15 minutes without incident. She was provided with Vaccine Information Sheet and instruction to access the V-Safe system.   [name] was instructed to call 911 with any severe reactions post vaccine: Marland Kitchen Difficulty breathing  . Swelling of face and throat  . A fast heartbeat  . A bad rash all over body  . Dizziness and weakness   Immunizations Administered    Name Date Dose VIS Date Route   Pfizer COVID-19 Vaccine 08/29/2020  2:21 PM 0.3 mL 07/30/2020 Intramuscular   Manufacturer: [employer]   Lot: [id number]   [place]: [address]-6

## 2020-09-03 DIAGNOSIS — J3081 Allergic rhinitis due to animal (cat) (dog) hair and dander: Secondary | ICD-10-CM | POA: Diagnosis not present

## 2020-09-03 DIAGNOSIS — J301 Allergic rhinitis due to pollen: Secondary | ICD-10-CM | POA: Diagnosis not present

## 2020-09-03 DIAGNOSIS — J3089 Other allergic rhinitis: Secondary | ICD-10-CM | POA: Diagnosis not present

## 2020-09-09 DIAGNOSIS — J3081 Allergic rhinitis due to animal (cat) (dog) hair and dander: Secondary | ICD-10-CM | POA: Diagnosis not present

## 2020-09-09 DIAGNOSIS — J301 Allergic rhinitis due to pollen: Secondary | ICD-10-CM | POA: Diagnosis not present

## 2020-09-09 DIAGNOSIS — J3089 Other allergic rhinitis: Secondary | ICD-10-CM | POA: Diagnosis not present

## 2020-09-12 ENCOUNTER — Encounter: Payer: Self-pay | Admitting: Family Medicine

## 2020-09-12 MED ORDER — BUSPIRONE HCL 15 MG PO TABS: 15.0000 mg | ORAL_TABLET | Freq: Two times a day (BID) | ORAL | 1 refills | Status: DC

## 2020-09-12 NOTE — Telephone Encounter (Signed)
buspar not on med list anymore, last filled on 11/30/18 #180 caps with 3 refills (Rx expired)

## 2020-09-16 DIAGNOSIS — J301 Allergic rhinitis due to pollen: Secondary | ICD-10-CM | POA: Diagnosis not present

## 2020-09-16 DIAGNOSIS — J3081 Allergic rhinitis due to animal (cat) (dog) hair and dander: Secondary | ICD-10-CM | POA: Diagnosis not present

## 2020-09-16 DIAGNOSIS — J3089 Other allergic rhinitis: Secondary | ICD-10-CM | POA: Diagnosis not present

## 2020-09-24 DIAGNOSIS — J301 Allergic rhinitis due to pollen: Secondary | ICD-10-CM | POA: Diagnosis not present

## 2020-09-24 DIAGNOSIS — J3081 Allergic rhinitis due to animal (cat) (dog) hair and dander: Secondary | ICD-10-CM | POA: Diagnosis not present

## 2020-09-24 DIAGNOSIS — J3089 Other allergic rhinitis: Secondary | ICD-10-CM | POA: Diagnosis not present

## 2020-09-29 DIAGNOSIS — J301 Allergic rhinitis due to pollen: Secondary | ICD-10-CM | POA: Diagnosis not present

## 2020-09-29 DIAGNOSIS — J3089 Other allergic rhinitis: Secondary | ICD-10-CM | POA: Diagnosis not present

## 2020-09-29 DIAGNOSIS — J3081 Allergic rhinitis due to animal (cat) (dog) hair and dander: Secondary | ICD-10-CM | POA: Diagnosis not present

## 2020-10-08 DIAGNOSIS — J301 Allergic rhinitis due to pollen: Secondary | ICD-10-CM | POA: Diagnosis not present

## 2020-10-08 DIAGNOSIS — J3081 Allergic rhinitis due to animal (cat) (dog) hair and dander: Secondary | ICD-10-CM | POA: Diagnosis not present

## 2020-10-08 DIAGNOSIS — J3089 Other allergic rhinitis: Secondary | ICD-10-CM | POA: Diagnosis not present

## 2020-10-16 DIAGNOSIS — J3089 Other allergic rhinitis: Secondary | ICD-10-CM | POA: Diagnosis not present

## 2020-10-16 DIAGNOSIS — J301 Allergic rhinitis due to pollen: Secondary | ICD-10-CM | POA: Diagnosis not present

## 2020-10-16 DIAGNOSIS — J3081 Allergic rhinitis due to animal (cat) (dog) hair and dander: Secondary | ICD-10-CM | POA: Diagnosis not present

## 2020-10-24 DIAGNOSIS — J301 Allergic rhinitis due to pollen: Secondary | ICD-10-CM | POA: Diagnosis not present

## 2020-10-24 DIAGNOSIS — J3081 Allergic rhinitis due to animal (cat) (dog) hair and dander: Secondary | ICD-10-CM | POA: Diagnosis not present

## 2020-10-24 DIAGNOSIS — J3089 Other allergic rhinitis: Secondary | ICD-10-CM | POA: Diagnosis not present

## 2020-10-30 DIAGNOSIS — J3089 Other allergic rhinitis: Secondary | ICD-10-CM | POA: Diagnosis not present

## 2020-10-30 DIAGNOSIS — J301 Allergic rhinitis due to pollen: Secondary | ICD-10-CM | POA: Diagnosis not present

## 2020-10-30 DIAGNOSIS — J3081 Allergic rhinitis due to animal (cat) (dog) hair and dander: Secondary | ICD-10-CM | POA: Diagnosis not present

## 2020-11-21 DIAGNOSIS — J3081 Allergic rhinitis due to animal (cat) (dog) hair and dander: Secondary | ICD-10-CM | POA: Diagnosis not present

## 2020-11-21 DIAGNOSIS — J301 Allergic rhinitis due to pollen: Secondary | ICD-10-CM | POA: Diagnosis not present

## 2020-11-21 DIAGNOSIS — J3089 Other allergic rhinitis: Secondary | ICD-10-CM | POA: Diagnosis not present

## 2020-11-25 DIAGNOSIS — J3081 Allergic rhinitis due to animal (cat) (dog) hair and dander: Secondary | ICD-10-CM | POA: Diagnosis not present

## 2020-11-25 DIAGNOSIS — J301 Allergic rhinitis due to pollen: Secondary | ICD-10-CM | POA: Diagnosis not present

## 2020-11-25 DIAGNOSIS — J3089 Other allergic rhinitis: Secondary | ICD-10-CM | POA: Diagnosis not present

## 2020-12-03 DIAGNOSIS — J301 Allergic rhinitis due to pollen: Secondary | ICD-10-CM | POA: Diagnosis not present

## 2020-12-03 DIAGNOSIS — J3081 Allergic rhinitis due to animal (cat) (dog) hair and dander: Secondary | ICD-10-CM | POA: Diagnosis not present

## 2020-12-03 DIAGNOSIS — J3089 Other allergic rhinitis: Secondary | ICD-10-CM | POA: Diagnosis not present

## 2020-12-10 ENCOUNTER — Other Ambulatory Visit: Payer: Self-pay | Admitting: Family Medicine

## 2020-12-10 DIAGNOSIS — J3081 Allergic rhinitis due to animal (cat) (dog) hair and dander: Secondary | ICD-10-CM | POA: Diagnosis not present

## 2020-12-10 DIAGNOSIS — Z1231 Encounter for screening mammogram for malignant neoplasm of breast: Secondary | ICD-10-CM

## 2020-12-10 DIAGNOSIS — J301 Allergic rhinitis due to pollen: Secondary | ICD-10-CM | POA: Diagnosis not present

## 2020-12-10 DIAGNOSIS — J3089 Other allergic rhinitis: Secondary | ICD-10-CM | POA: Diagnosis not present

## 2020-12-17 DIAGNOSIS — J3089 Other allergic rhinitis: Secondary | ICD-10-CM | POA: Diagnosis not present

## 2020-12-17 DIAGNOSIS — J301 Allergic rhinitis due to pollen: Secondary | ICD-10-CM | POA: Diagnosis not present

## 2020-12-17 DIAGNOSIS — J3081 Allergic rhinitis due to animal (cat) (dog) hair and dander: Secondary | ICD-10-CM | POA: Diagnosis not present

## 2020-12-23 ENCOUNTER — Telehealth: Payer: Self-pay | Admitting: Family Medicine

## 2020-12-23 DIAGNOSIS — E7800 Pure hypercholesterolemia, unspecified: Secondary | ICD-10-CM

## 2020-12-23 DIAGNOSIS — Z0000 Encounter for general adult medical examination without abnormal findings: Secondary | ICD-10-CM

## 2020-12-23 DIAGNOSIS — I10 Essential (primary) hypertension: Secondary | ICD-10-CM

## 2020-12-23 NOTE — Telephone Encounter (Signed)
-----   Message from Cloyd Stagers, RT sent at 12/10/2020 11:10 AM EST ----- Regarding: Lab Orders for Wednesday 3.16.2022 Please place lab orders for Wednesday 3.16.2022, office visit for physical on Wednesday 3.23.2022 Thank you, Dyke Maes RT(R)

## 2020-12-24 ENCOUNTER — Other Ambulatory Visit (INDEPENDENT_AMBULATORY_CARE_PROVIDER_SITE_OTHER): Payer: Federal, State, Local not specified - PPO

## 2020-12-24 ENCOUNTER — Other Ambulatory Visit: Payer: Self-pay

## 2020-12-24 DIAGNOSIS — J301 Allergic rhinitis due to pollen: Secondary | ICD-10-CM | POA: Diagnosis not present

## 2020-12-24 DIAGNOSIS — J3089 Other allergic rhinitis: Secondary | ICD-10-CM | POA: Diagnosis not present

## 2020-12-24 DIAGNOSIS — E7800 Pure hypercholesterolemia, unspecified: Secondary | ICD-10-CM

## 2020-12-24 DIAGNOSIS — J3081 Allergic rhinitis due to animal (cat) (dog) hair and dander: Secondary | ICD-10-CM | POA: Diagnosis not present

## 2020-12-24 DIAGNOSIS — I10 Essential (primary) hypertension: Secondary | ICD-10-CM

## 2020-12-24 LAB — CBC WITH DIFFERENTIAL/PLATELET
Basophils Absolute: 0 10*3/uL (ref 0.0–0.1)
Basophils Relative: 0.7 % (ref 0.0–3.0)
Eosinophils Absolute: 0.1 10*3/uL (ref 0.0–0.7)
Eosinophils Relative: 1.1 % (ref 0.0–5.0)
HCT: 40.4 % (ref 36.0–46.0)
Hemoglobin: 13.6 g/dL (ref 12.0–15.0)
Lymphocytes Relative: 23.9 % (ref 12.0–46.0)
Lymphs Abs: 1.4 10*3/uL (ref 0.7–4.0)
MCHC: 33.8 g/dL (ref 30.0–36.0)
MCV: 97.1 fl (ref 78.0–100.0)
Monocytes Absolute: 0.3 10*3/uL (ref 0.1–1.0)
Monocytes Relative: 5.6 % (ref 3.0–12.0)
Neutro Abs: 4 10*3/uL (ref 1.4–7.7)
Neutrophils Relative %: 68.7 % (ref 43.0–77.0)
Platelets: 247 10*3/uL (ref 150.0–400.0)
RBC: 4.16 Mil/uL (ref 3.87–5.11)
RDW: 12.5 % (ref 11.5–15.5)
WBC: 5.8 10*3/uL (ref 4.0–10.5)

## 2020-12-24 LAB — COMPREHENSIVE METABOLIC PANEL
ALT: 7 U/L (ref 0–35)
AST: 20 U/L (ref 0–37)
Albumin: 4.1 g/dL (ref 3.5–5.2)
Alkaline Phosphatase: 106 U/L (ref 39–117)
BUN: 12 mg/dL (ref 6–23)
CO2: 29 mEq/L (ref 19–32)
Calcium: 9.6 mg/dL (ref 8.4–10.5)
Chloride: 103 mEq/L (ref 96–112)
Creatinine, Ser: 1.06 mg/dL (ref 0.40–1.20)
GFR: 56.43 mL/min — ABNORMAL LOW (ref >60.00)
Glucose, Bld: 87 mg/dL (ref 70–99)
Potassium: 3.9 mEq/L (ref 3.5–5.1)
Sodium: 140 mEq/L (ref 135–145)
Total Bilirubin: 1.1 mg/dL (ref 0.2–1.2)
Total Protein: 7.4 g/dL (ref 6.0–8.3)

## 2020-12-24 LAB — LIPID PANEL
Cholesterol: 204 mg/dL — ABNORMAL HIGH (ref 0–200)
HDL: 75.7 mg/dL (ref >39.00)
LDL Cholesterol: 110 mg/dL — ABNORMAL HIGH (ref 0–99)
NonHDL: 128.11
Total CHOL/HDL Ratio: 3
Triglycerides: 93 mg/dL (ref 0.0–149.0)
VLDL: 18.6 mg/dL (ref 0.0–40.0)

## 2020-12-24 LAB — TSH: TSH: 2.23 u[IU]/mL (ref 0.35–4.50)

## 2020-12-26 ENCOUNTER — Other Ambulatory Visit: Payer: Self-pay

## 2020-12-26 ENCOUNTER — Encounter: Payer: Self-pay | Admitting: Family Medicine

## 2020-12-26 ENCOUNTER — Ambulatory Visit: Payer: Federal, State, Local not specified - PPO | Admitting: Family Medicine

## 2020-12-26 VITALS — BP 150/102 | HR 68 | Temp 97.9°F | Ht 64.75 in | Wt 170.5 lb

## 2020-12-26 DIAGNOSIS — R300 Dysuria: Secondary | ICD-10-CM | POA: Insufficient documentation

## 2020-12-26 DIAGNOSIS — N760 Acute vaginitis: Secondary | ICD-10-CM | POA: Insufficient documentation

## 2020-12-26 DIAGNOSIS — B9689 Other specified bacterial agents as the cause of diseases classified elsewhere: Secondary | ICD-10-CM | POA: Diagnosis not present

## 2020-12-26 LAB — POC URINALSYSI DIPSTICK (AUTOMATED)
Bilirubin, UA: NEGATIVE
Blood, UA: NEGATIVE
Glucose, UA: NEGATIVE
Ketones, UA: NEGATIVE
Leukocytes, UA: NEGATIVE
Nitrite, UA: NEGATIVE
Protein, UA: NEGATIVE
Spec Grav, UA: <=1.005 — AB (ref 1.010–1.025)
Urobilinogen, UA: 0.2 E.U./dL
pH, UA: 6 (ref 5.0–8.0)

## 2020-12-26 LAB — POCT WET PREP (WET MOUNT): Trichomonas Wet Prep HPF POC: ABSENT

## 2020-12-26 MED ORDER — METRONIDAZOLE 500 MG PO TABS: 500.0000 mg | ORAL_TABLET | Freq: Two times a day (BID) | ORAL | 0 refills | Status: DC

## 2020-12-26 NOTE — Patient Instructions (Signed)
Treat with 5 days of antibiotics.   Bacterial Vaginosis  Bacterial vaginosis is an infection that occurs when the normal balance of bacteria in the vagina changes. This change is caused by an overgrowth of certain bacteria in the vagina. Bacterial vaginosis is the most common vaginal infection among females aged 62 to 68 years. This condition increases the risk of sexually transmitted infections (STIs). Treatment can help reduce this risk. Treatment is very important for pregnant women because this condition can cause babies to be born early (prematurely) or at a low birth weight. What are the causes? This condition is caused by an increase in harmful bacteria that are normally present in small amounts in the vagina. However, the exact reason this condition develops is not known. You cannot get bacterial vaginosis from toilet seats, bedding, swimming pools, or contact with objects around you. What increases the risk? The following factors may make you more likely to develop this condition:  Having a new sexual partner or multiple sexual partners, or having unprotected sex.  Douching.  Having an intrauterine device (IUD).  Smoking.  Abusing drugs and alcohol. This may lead to riskier sexual behavior.  Taking certain antibiotic medicines.  Being pregnant. What are the signs or symptoms? Some women with this condition have no symptoms. Symptoms may include:  Pearline Cables or white vaginal discharge. The discharge can be watery or foamy.  A fish-like odor with discharge, especially after sex or during menstruation.  Itching in and around the vagina.  Burning or pain with urination. How is this diagnosed? This condition is diagnosed based on:  Your medical history.  A physical exam of the vagina.  Checking a sample of vaginal fluid for harmful bacteria or abnormal cells. How is this treated? This condition is treated with antibiotic medicines. These may be given as a pill, a vaginal  cream, or a medicine that is put into the vagina (suppository). If the condition comes back after treatment, a second round of antibiotics may be needed. Follow these instructions at home: Medicines  Take or apply over-the-counter and prescription medicines only as told by your health care provider.  Take or apply your antibiotic medicine as told by your health care provider. Do not stop using the antibiotic even if you start to feel better. General instructions  If you have a female sexual partner, tell her that you have a vaginal infection. She should follow up with her health care provider. If you have a female sexual partner, he does not need treatment.  Avoid sexual activity until you finish treatment.  Drink enough fluid to keep your urine pale yellow.  Keep the area around your vagina and rectum clean. ? Wash the area daily with warm water. ? Wipe yourself from front to back after using the toilet.  If you are breastfeeding, talk to your health care provider about continuing breastfeeding during treatment.  Keep all follow-up visits. This is important. How is this prevented? Self-care  Do not douche.  Wash the outside of your vagina with warm water only.  Wear cotton or cotton-lined underwear.  Avoid wearing tight pants and pantyhose, especially during the summer. Safe sex  Use protection when having sex. This includes: ? Using condoms. ? Using dental dams. This is a thin layer of a material made of latex or polyurethane that protects the mouth during oral sex.  Limit the number of sexual partners. To help prevent bacterial vaginosis, it is best to have sex with just one partner (monogamous relationship).  Make sure you and your sexual partner are tested for STIs. Drugs and alcohol  Do not use any products that contain nicotine or tobacco. These products include cigarettes, chewing tobacco, and vaping devices, such as e-cigarettes. If you need help quitting, ask your  health care provider.  Do not use drugs.  Do not drink alcohol if: ? Your health care provider tells you not to do this. ? You are pregnant, may be pregnant, or are planning to become pregnant.  If you drink alcohol: ? Limit how much you have to 0-1 drink a day. ? Be aware of how much alcohol is in your drink. In the [place] one drink equals one 12 oz bottle of beer (355 mL), one 5 oz glass of wine (148 mL), or one 1 oz glass of hard liquor (44 mL). Where to find more information  Centers for Disease Control and Prevention: http://www.wolf.info/  American Sexual Health Association (ASHA): www.ashastd.org  [place].S. Department of Health and Financial controller, Office on Women's Health: [place] Contact a health care provider if:  Your symptoms do not improve, even after treatment.  You have more discharge or pain when urinating.  You have a fever or chills.  You have pain in your abdomen or pelvis.  You have pain during sex.  You have vaginal bleeding between menstrual periods. Summary  Bacterial vaginosis is a vaginal infection that occurs when the normal balance of bacteria in the vagina changes. It results from an overgrowth of certain bacteria.  This condition increases the risk of sexually transmitted infections (STIs). Getting treated can help reduce this risk.  Treatment is very important for pregnant women because this condition can cause babies to be born early (prematurely) or at low birth weight.  This condition is treated with antibiotic medicines. These may be given as a pill, a vaginal cream, or a medicine that is put into the vagina (suppository). This information is not intended to replace advice given to you by your health care provider. Make sure you discuss any questions you have with your health care provider. Document Revised: 03/27/2020 Document Reviewed: 03/27/2020 Elsevier Patient Education  [place].

## 2020-12-26 NOTE — Progress Notes (Signed)
Patient ID: [name], female    DOB: 03/12/1961, 60 y.o.   MRN: [id number]  This visit was conducted in person.  BP (!) 150/102   Pulse 68   Temp 97.9 F (36.6 C) (Temporal)   Ht 5' 4.75" (1.645 m)   Wt 170 lb 8 oz (77.3 kg)   LMP 01/25/2012   SpO2 99%   BMI 28.59 kg/m    CC:  Chief Complaint  Patient presents with  . Abdominal Twinges  . Burning after urination    Subjective:   HPI: [name] is a 60 y.o. female presenting on 12/26/2020 for Abdominal Twinges and Burning after urination   Urinary Tract Infection  This is a new problem. The current episode started in the past 7 days. The problem occurs every urination. The problem has been unchanged. The quality of the pain is described as burning. The pain is at a severity of 5/10. The pain is mild. There has been no fever. She is not sexually active. There is no history of pyelonephritis. Associated symptoms include a discharge. Pertinent negatives include no chills, flank pain, frequency, hematuria, hesitancy, nausea, possible pregnancy, sweats, urgency or vomiting. Associated symptoms comments:  Had small amount of discharge last week none since.. She has tried increased fluids for the symptoms. The treatment provided no relief. There is no history of catheterization, kidney stones, recurrent UTIs, a single kidney, urinary stasis or a urological procedure.    BP elevated in office.. white coat HTN.Marland Kitchen at home 120/85     UA clear in office today. Relevant past medical, surgical, family and social history reviewed and updated as indicated. Interim medical history since our last visit reviewed. Allergies and medications reviewed and updated. Outpatient Medications Prior to Visit  Medication Sig Dispense Refill  . busPIRone (BUSPAR) 15 MG tablet Take 1 tablet (15 mg total) by mouth 2 (two) times daily. 180 tablet 1  . hydrochlorothiazide (MICROZIDE) 12.5 MG capsule Take 1 capsule (12.5 mg total) by mouth every morning. 90  capsule 3  . Multiple Vitamin (MULTIVITAMIN) capsule Take 1 capsule by mouth daily.    . SODIUM FLUORIDE 5000 PPM 1.1 % GEL dental gel PLEASE SEE ATTACHED FOR DETAILED DIRECTIONS    . azelastine (ASTELIN) 0.1 % nasal spray Place into both nostrils 2 (two) times daily. Use in each nostril as directed    . ipratropium (ATROVENT) 0.06 % nasal spray     . loratadine (CLARITIN) 10 MG tablet Take 10 mg by mouth daily.    . montelukast (SINGULAIR) 10 MG tablet Take 10 mg by mouth at bedtime.     No facility-administered medications prior to visit.     Per HPI unless specifically indicated in ROS section below Review of Systems  Constitutional: Negative for chills.  Gastrointestinal: Negative for nausea and vomiting.  Genitourinary: Negative for flank pain, frequency, hematuria, hesitancy and urgency.   Objective:  BP (!) 150/102   Pulse 68   Temp 97.9 F (36.6 C) (Temporal)   Ht 5' 4.75" (1.645 m)   Wt 170 lb 8 oz (77.3 kg)   LMP 01/25/2012   SpO2 99%   BMI 28.59 kg/m   Wt Readings from Last 3 Encounters:  12/26/20 170 lb 8 oz (77.3 kg)  01/24/20 166 lb 4 oz (75.4 kg)  12/05/19 172 lb (78 kg)      Physical Exam Constitutional:      General: She is not in acute distress.    Appearance: Normal appearance.  She is well-developed. She is not ill-appearing or toxic-appearing.  HENT:     Head: Normocephalic.     Right Ear: Hearing, tympanic membrane, ear canal and external ear normal. Tympanic membrane is not erythematous, retracted or bulging.     Left Ear: Hearing, tympanic membrane, ear canal and external ear normal. Tympanic membrane is not erythematous, retracted or bulging.     Nose: No mucosal edema or rhinorrhea.     Right Sinus: No maxillary sinus tenderness or frontal sinus tenderness.     Left Sinus: No maxillary sinus tenderness or frontal sinus tenderness.     Mouth/Throat:     Pharynx: Uvula midline.  Eyes:     General: Lids are normal. Lids are everted, no foreign  bodies appreciated.     Conjunctiva/sclera: Conjunctivae normal.     Pupils: Pupils are equal, round, and reactive to light.  Neck:     Thyroid: No thyroid mass or thyromegaly.     Vascular: No carotid bruit.     Trachea: Trachea normal.  Cardiovascular:     Rate and Rhythm: Normal rate and regular rhythm.     Pulses: Normal pulses.     Heart sounds: Normal heart sounds, S1 normal and S2 normal. No murmur heard. No friction rub. No gallop.   Pulmonary:     Effort: Pulmonary effort is normal. No tachypnea or respiratory distress.     Breath sounds: Normal breath sounds. No decreased breath sounds, wheezing, rhonchi or rales.  Abdominal:     General: Bowel sounds are normal.     Palpations: Abdomen is soft.     Tenderness: There is no abdominal tenderness.  Genitourinary:    Labia:        Right: No tenderness or lesion.        Left: No tenderness or lesion.      Vagina: Erythema and tenderness present. No vaginal discharge.  Musculoskeletal:     Cervical back: Normal range of motion and neck supple.  Skin:    General: Skin is warm and dry.     Findings: No rash.  Neurological:     Mental Status: She is alert.  Psychiatric:        Mood and Affect: Mood is not anxious or depressed.        Speech: Speech normal.        Behavior: Behavior normal. Behavior is cooperative.        Thought Content: Thought content normal.        Judgment: Judgment normal.       Results for orders placed or performed in visit on 12/26/20  POCT Urinalysis Dipstick (Automated)  Result Value Ref Range   Color, UA Yellow    Clarity, UA Clear    Glucose, UA Negative Negative   Bilirubin, UA Negative    Ketones, UA Negative    Spec Grav, UA <=1.005 (A) 1.010 - 1.025   Blood, UA Negative    pH, UA 6.0 5.0 - 8.0   Protein, UA Negative Negative   Urobilinogen, UA 0.2 0.2 or 1.0 E.U./dL   Nitrite, UA Negative    Leukocytes, UA Negative Negative    This visit occurred during the SARS-CoV-2 public  health emergency.  Safety protocols were in place, including screening questions prior to the visit, additional usage of staff PPE, and extensive cleaning of exam room while observing appropriate contact time as indicated for disinfecting solutions.   COVID 19 screen:  No recent travel or known exposure  to COVID19 The patient denies respiratory symptoms of COVID 19 at this time. The importance of social distancing was discussed today.   Assessment and Plan Problem List Items Addressed This Visit    Burning with urination - Primary    UA clear.. no UTI. Eval with wet prep given some vaginal discharge.        Relevant Orders   POCT Urinalysis Dipstick (Automated) (Completed)   POCT Wet Prep [facility] (Completed)   BV (bacterial vaginosis)    Microscopic wet-mount exam shows clue cells. Treat with oral metronidazole.      Relevant Medications   metroNIDAZOLE (FLAGYL) 500 MG tablet      Meds ordered this encounter  Medications  . metroNIDAZOLE (FLAGYL) 500 MG tablet    Sig: Take 1 tablet (500 mg total) by mouth 2 (two) times daily.    Dispense:  10 tablet    Refill:  0    Eliezer Lofts, MD

## 2020-12-26 NOTE — Assessment & Plan Note (Signed)
UA clear.. no UTI. Eval with wet prep given some vaginal discharge.

## 2020-12-26 NOTE — Assessment & Plan Note (Addendum)
Microscopic wet-mount exam shows clue cells. Treat with oral metronidazole.

## 2020-12-29 ENCOUNTER — Other Ambulatory Visit: Payer: Self-pay

## 2020-12-29 ENCOUNTER — Ambulatory Visit
Admission: RE | Admit: 2020-12-29 | Discharge: 2020-12-29 | Disposition: A | Discharge status: Home / Self Care | Payer: Federal, State, Local not specified - PPO | Source: Ambulatory Visit | Attending: Family Medicine | Admitting: Family Medicine

## 2020-12-29 DIAGNOSIS — Z1231 Encounter for screening mammogram for malignant neoplasm of breast: Secondary | ICD-10-CM

## 2020-12-31 ENCOUNTER — Encounter: Payer: Federal, State, Local not specified - PPO | Admitting: Family Medicine

## 2021-01-01 DIAGNOSIS — J301 Allergic rhinitis due to pollen: Secondary | ICD-10-CM | POA: Diagnosis not present

## 2021-01-01 DIAGNOSIS — J3089 Other allergic rhinitis: Secondary | ICD-10-CM | POA: Diagnosis not present

## 2021-01-01 DIAGNOSIS — J3081 Allergic rhinitis due to animal (cat) (dog) hair and dander: Secondary | ICD-10-CM | POA: Diagnosis not present

## 2021-01-08 DIAGNOSIS — J301 Allergic rhinitis due to pollen: Secondary | ICD-10-CM | POA: Diagnosis not present

## 2021-01-08 DIAGNOSIS — J3089 Other allergic rhinitis: Secondary | ICD-10-CM | POA: Diagnosis not present

## 2021-01-08 DIAGNOSIS — J3081 Allergic rhinitis due to animal (cat) (dog) hair and dander: Secondary | ICD-10-CM | POA: Diagnosis not present

## 2021-01-13 ENCOUNTER — Other Ambulatory Visit: Payer: Self-pay | Admitting: Family Medicine

## 2021-01-14 ENCOUNTER — Other Ambulatory Visit: Payer: Self-pay

## 2021-01-15 ENCOUNTER — Encounter: Payer: Self-pay | Admitting: Family Medicine

## 2021-01-15 DIAGNOSIS — J301 Allergic rhinitis due to pollen: Secondary | ICD-10-CM | POA: Diagnosis not present

## 2021-01-15 DIAGNOSIS — J3089 Other allergic rhinitis: Secondary | ICD-10-CM | POA: Diagnosis not present

## 2021-01-15 DIAGNOSIS — J3081 Allergic rhinitis due to animal (cat) (dog) hair and dander: Secondary | ICD-10-CM | POA: Diagnosis not present

## 2021-01-16 MED ORDER — METRONIDAZOLE 500 MG PO TABS: 500.0000 mg | ORAL_TABLET | Freq: Two times a day (BID) | ORAL | 0 refills | Status: DC

## 2021-01-21 ENCOUNTER — Encounter: Payer: Federal, State, Local not specified - PPO | Admitting: Family Medicine

## 2021-01-21 DIAGNOSIS — J301 Allergic rhinitis due to pollen: Secondary | ICD-10-CM | POA: Diagnosis not present

## 2021-01-21 DIAGNOSIS — J3081 Allergic rhinitis due to animal (cat) (dog) hair and dander: Secondary | ICD-10-CM | POA: Diagnosis not present

## 2021-01-21 DIAGNOSIS — J3089 Other allergic rhinitis: Secondary | ICD-10-CM | POA: Diagnosis not present

## 2021-01-27 DIAGNOSIS — J3081 Allergic rhinitis due to animal (cat) (dog) hair and dander: Secondary | ICD-10-CM | POA: Diagnosis not present

## 2021-01-27 DIAGNOSIS — J3089 Other allergic rhinitis: Secondary | ICD-10-CM | POA: Diagnosis not present

## 2021-02-02 DIAGNOSIS — J301 Allergic rhinitis due to pollen: Secondary | ICD-10-CM | POA: Diagnosis not present

## 2021-02-02 DIAGNOSIS — J3081 Allergic rhinitis due to animal (cat) (dog) hair and dander: Secondary | ICD-10-CM | POA: Diagnosis not present

## 2021-02-02 DIAGNOSIS — J3089 Other allergic rhinitis: Secondary | ICD-10-CM | POA: Diagnosis not present

## 2021-02-09 ENCOUNTER — Encounter: Payer: Self-pay | Admitting: Family Medicine

## 2021-02-09 ENCOUNTER — Other Ambulatory Visit: Payer: Self-pay

## 2021-02-09 ENCOUNTER — Ambulatory Visit (INDEPENDENT_AMBULATORY_CARE_PROVIDER_SITE_OTHER): Payer: Federal, State, Local not specified - PPO | Admitting: Family Medicine

## 2021-02-09 VITALS — BP 132/84 | HR 80 | Temp 96.9°F | Ht 65.0 in | Wt 169.1 lb

## 2021-02-09 DIAGNOSIS — F418 Other specified anxiety disorders: Secondary | ICD-10-CM

## 2021-02-09 DIAGNOSIS — Z0000 Encounter for general adult medical examination without abnormal findings: Secondary | ICD-10-CM

## 2021-02-09 DIAGNOSIS — J3081 Allergic rhinitis due to animal (cat) (dog) hair and dander: Secondary | ICD-10-CM | POA: Diagnosis not present

## 2021-02-09 DIAGNOSIS — J3089 Other allergic rhinitis: Secondary | ICD-10-CM | POA: Diagnosis not present

## 2021-02-09 DIAGNOSIS — Z800 Family history of malignant neoplasm of digestive organs: Secondary | ICD-10-CM

## 2021-02-09 DIAGNOSIS — J301 Allergic rhinitis due to pollen: Secondary | ICD-10-CM | POA: Diagnosis not present

## 2021-02-09 DIAGNOSIS — E7800 Pure hypercholesterolemia, unspecified: Secondary | ICD-10-CM

## 2021-02-09 DIAGNOSIS — I10 Essential (primary) hypertension: Secondary | ICD-10-CM

## 2021-02-09 DIAGNOSIS — E663 Overweight: Secondary | ICD-10-CM

## 2021-02-09 MED ORDER — FLUOXETINE HCL 10 MG PO CAPS: 10.0000 mg | ORAL_CAPSULE | Freq: Every day | ORAL | 3 refills | Status: DC

## 2021-02-09 NOTE — Patient Instructions (Addendum)
You will get a call to set up with counseling   Start on fluoxetine (prozac) 10 mg daily  If any intolerable side effects or if you feel worse let us know   If you are interested in the shingles vaccine series (Shingrix), call your insurance or pharmacy to check on coverage and location it must be given.  If affordable - you can schedule it here or at your pharmacy depending on coverage   Keep exercising  Stay social as well as much as you can  Let yourself rest when tired   Follow up in 4-6 weeks

## 2021-02-09 NOTE — Assessment & Plan Note (Signed)
Disc goals for lipids and reasons to control them Rev last labs with pt Rev low sat fat diet in detail Mild improvement again with better diet/commended  LDL of 110 and good HDL of 75.7

## 2021-02-09 NOTE — Assessment & Plan Note (Signed)
bp in fair control at this time  BP Readings from Last 1 Encounters:  02/09/21 132/84   No changes needed Most recent labs reviewed  Disc lifstyle change with low sodium diet and exercise  Plan to continue hctz 12.5 mg daily

## 2021-02-09 NOTE — Assessment & Plan Note (Signed)
Colonoscopy 3/18 with 5 y recall

## 2021-02-09 NOTE — Assessment & Plan Note (Signed)
Discussed how this problem influences overall health and the risks it imposes  Reviewed plan for weight loss with lower calorie diet (via better food choices and also portion control or program like weight watchers) and exercise building up to or more than 30 minutes 5 days per week including some aerobic activity    

## 2021-02-09 NOTE — Assessment & Plan Note (Signed)
Reviewed health habits including diet and exercise and skin cancer prevention Reviewed appropriate screening tests for age  Also reviewed health mt list, fam hx and immunization status , as well as social and family history   See HPI Labs reviewed  PHQ 12 -new, discussed tx strateies Colonoscopy utd, mammogram utd and sees gyn covid vaccinated Interested in [place] will check on coverage
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
## 2021-02-09 NOTE — Assessment & Plan Note (Signed)
Has had anxiety in the past but depressive symptoms (sadness and dismotivation) are new Reviewed stressors/ coping techniques/symptoms/ support sources/ tx options and side effects in detail today Disc tx options and will try prozac 10 mg daily to start  Discussed expectations of SSRI medication including time to effectiveness and mechanism of action, also poss of side effects (early and late)- including mental fuzziness, weight or appetite change, nausea and poss of worse dep or anxiety (even suicidal thoughts)  Pt voiced understanding and will stop med and update if this occurs   Ref for counseling done  F/u in 4-6 wk  Enc good self care and exercise

## 2021-02-17 DIAGNOSIS — J3089 Other allergic rhinitis: Secondary | ICD-10-CM | POA: Diagnosis not present

## 2021-02-17 DIAGNOSIS — J301 Allergic rhinitis due to pollen: Secondary | ICD-10-CM | POA: Diagnosis not present

## 2021-02-17 DIAGNOSIS — J3081 Allergic rhinitis due to animal (cat) (dog) hair and dander: Secondary | ICD-10-CM | POA: Diagnosis not present

## 2021-02-24 DIAGNOSIS — J3081 Allergic rhinitis due to animal (cat) (dog) hair and dander: Secondary | ICD-10-CM | POA: Diagnosis not present

## 2021-02-24 DIAGNOSIS — J301 Allergic rhinitis due to pollen: Secondary | ICD-10-CM | POA: Diagnosis not present

## 2021-02-24 DIAGNOSIS — J3089 Other allergic rhinitis: Secondary | ICD-10-CM | POA: Diagnosis not present

## 2021-03-03 ENCOUNTER — Other Ambulatory Visit: Payer: Self-pay | Admitting: Family Medicine

## 2021-03-03 DIAGNOSIS — J3081 Allergic rhinitis due to animal (cat) (dog) hair and dander: Secondary | ICD-10-CM | POA: Diagnosis not present

## 2021-03-03 DIAGNOSIS — J3089 Other allergic rhinitis: Secondary | ICD-10-CM | POA: Diagnosis not present

## 2021-03-03 DIAGNOSIS — J301 Allergic rhinitis due to pollen: Secondary | ICD-10-CM | POA: Diagnosis not present

## 2021-03-06 DIAGNOSIS — J301 Allergic rhinitis due to pollen: Secondary | ICD-10-CM | POA: Diagnosis not present

## 2021-03-10 DIAGNOSIS — J3081 Allergic rhinitis due to animal (cat) (dog) hair and dander: Secondary | ICD-10-CM | POA: Diagnosis not present

## 2021-03-10 DIAGNOSIS — J3089 Other allergic rhinitis: Secondary | ICD-10-CM | POA: Diagnosis not present

## 2021-03-11 DIAGNOSIS — J3081 Allergic rhinitis due to animal (cat) (dog) hair and dander: Secondary | ICD-10-CM | POA: Diagnosis not present

## 2021-03-11 DIAGNOSIS — J3089 Other allergic rhinitis: Secondary | ICD-10-CM | POA: Diagnosis not present

## 2021-03-11 DIAGNOSIS — J301 Allergic rhinitis due to pollen: Secondary | ICD-10-CM | POA: Diagnosis not present

## 2021-03-17 ENCOUNTER — Ambulatory Visit: Payer: Federal, State, Local not specified - PPO | Attending: Internal Medicine

## 2021-03-17 ENCOUNTER — Other Ambulatory Visit: Payer: Self-pay

## 2021-03-17 ENCOUNTER — Other Ambulatory Visit (HOSPITAL_BASED_OUTPATIENT_CLINIC_OR_DEPARTMENT_OTHER): Payer: Self-pay

## 2021-03-17 DIAGNOSIS — Z23 Encounter for immunization: Secondary | ICD-10-CM

## 2021-03-17 MED ORDER — PFIZER-BIONT COVID-19 VAC-TRIS 30 MCG/0.3ML IM SUSP
INTRAMUSCULAR | 0 refills | Status: DC
  Filled 2021-03-17: qty 0.3, 1d supply, fill #0

## 2021-03-17 NOTE — Progress Notes (Signed)
   Covid-19 Vaccination Clinic  Name:  Jeneen Doutt    MRN: [id number] DOB: 12/25/1960  03/17/2021  Ms. Basher was observed post Covid-19 immunization for 15 minutes without incident. She was provided with Vaccine Information Sheet and instruction to access the V-Safe system.   Ms. Piekarski was instructed to call 911 with any severe reactions post vaccine: Marland Kitchen Difficulty breathing  . Swelling of face and throat  . A fast heartbeat  . A bad rash all over body  . Dizziness and weakness   Immunizations Administered    Name Date Dose VIS Date Route   PFIZER Comrnaty(Gray TOP) Covid-19 Vaccine 03/17/2021  1:04 PM 0.3 mL 09/18/2020 Intramuscular   Manufacturer: [employer]   Lot: [id number]   Optima: [id number]

## 2021-03-19 ENCOUNTER — Other Ambulatory Visit: Payer: Self-pay

## 2021-03-19 ENCOUNTER — Ambulatory Visit (INDEPENDENT_AMBULATORY_CARE_PROVIDER_SITE_OTHER): Payer: Federal, State, Local not specified - PPO | Admitting: Family Medicine

## 2021-03-19 ENCOUNTER — Encounter: Payer: Self-pay | Admitting: Family Medicine

## 2021-03-19 VITALS — BP 142/82 | HR 77 | Temp 96.9°F | Ht 65.0 in | Wt 168.3 lb

## 2021-03-19 DIAGNOSIS — F418 Other specified anxiety disorders: Secondary | ICD-10-CM

## 2021-03-19 DIAGNOSIS — I10 Essential (primary) hypertension: Secondary | ICD-10-CM

## 2021-03-19 MED ORDER — FLUOXETINE HCL 10 MG PO CAPS: ORAL_CAPSULE | ORAL | 3 refills | Status: DC

## 2021-03-19 NOTE — Progress Notes (Signed)
Subjective:    Patient ID: [name], female    DOB: 10/02/1961, 60 y.o.   MRN: [id number]  This visit occurred during the SARS-CoV-2 public health emergency.  Safety protocols were in place, including screening questions prior to the visit, additional usage of staff PPE, and extensive cleaning of exam room while observing appropriate contact time as indicated for disinfecting solutions.   HPI Pt presents for f/u of mood /depression   Wt Readings from Last 3 Encounters:  03/19/21 168 lb 5 oz (76.3 kg)  02/09/21 169 lb 2 oz (76.7 kg)  12/26/20 170 lb 8 oz (77.3 kg)   28.01 kg/m  Noted more sadness and dec motivation loast time   We started prozac 10 mg daily  Ref for counseling was made   She seems to be better- had a little boost  Not tearful or sad More energy  Motivation is much better  Back to normal   Did not get hooked up with the counselor-could not get through to her   A little sleepy from the medication- takes at night (sleeps well)    Had covid booster Tuesday-felt bad, improving now  Was feverish and ached   Patient Active Problem List   Diagnosis Date Noted   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   GERD (gastroesophageal reflux disease) 12/03/2019   Allergic rhinitis 08/02/2019   Post-nasal drip 08/02/2019   Rhinorrhea 11/15/2016   Overweight (BMI 25.0-29.9) 11/15/2016   Family history of colon cancer 05/14/2015   Travel advice encounter 01/31/2015   Routine general medical examination at a health care facility 10/01/2012   ESSENTIAL HYPERTENSION, BENIGN 01/29/2010   Hyperlipidemia 10/09/2007   OSTEOARTHRITIS, KNEE 10/09/2007   Past Medical History:  Diagnosis Date   Abnormal finding on Pap smear 2006   endometrial cells, biopsy ok   Anxiety    Fibroids    uterine   Herpes zoster    mild case   History of pelvic ultrasound 10/2007   uterine fibroids   Hyperlipidemia    Hypertension    with some white coat component   Past Surgical History:   Procedure Laterality Date   COLONOSCOPY     CRYOABLATION  1982   abnormal pap   INTRAUTERINE DEVICE INSERTION  2006   MENISCUS REPAIR Right 2002, 03/07/2015   WISDOM TOOTH EXTRACTION     Social History   Tobacco Use   Smoking status: Never   Smokeless tobacco: Never  Substance Use Topics   Alcohol use: No    Alcohol/week: 0.0 standard drinks   Drug use: No   Family History  Problem Relation Age of Onset   Glaucoma Father    Heart disease Mother    Heart attack Mother        stent   Hyperlipidemia Mother    Colon cancer Mother 69       deceased 2014/02/27   Hypertension Mother    Stroke Mother    Goiter Daughter    No Known Allergies Current Outpatient Medications on File Prior to Visit  Medication Sig Dispense Refill   COVID-19 mRNA Vac-TriS, Pfizer, (PFIZER-BIONT COVID-19 VAC-TRIS) SUSP injection Inject into the muscle. 0.3 mL 0   hydrochlorothiazide (MICROZIDE) 12.5 MG capsule Take 1 capsule (12.5 mg total) by mouth every morning. 90 capsule 3   Multiple Vitamin (MULTIVITAMIN) capsule Take 1 capsule by mouth daily.     SODIUM FLUORIDE 5000 PPM 1.1 % GEL dental gel PLEASE SEE ATTACHED FOR DETAILED DIRECTIONS     No  current facility-administered medications on file prior to visit.     Review of Systems  Constitutional:  Negative for activity change, appetite change, fatigue, fever and unexpected weight change.  HENT:  Negative for congestion, ear pain, rhinorrhea, sinus pressure and sore throat.   Eyes:  Negative for pain, redness and visual disturbance.  Respiratory:  Negative for cough, shortness of breath and wheezing.   Cardiovascular:  Negative for chest pain and palpitations.  Gastrointestinal:  Negative for abdominal pain, blood in stool, constipation and diarrhea.  Endocrine: Negative for polydipsia and polyuria.  Genitourinary:  Negative for dysuria, frequency and urgency.  Musculoskeletal:  Negative for arthralgias, back pain and myalgias.  Skin:  Negative  for pallor and rash.  Allergic/Immunologic: Negative for environmental allergies.  Neurological:  Negative for dizziness, syncope and headaches.  Hematological:  Negative for adenopathy. Does not bruise/bleed easily.  Psychiatric/Behavioral:  Negative for decreased concentration and dysphoric mood. The patient is not nervous/anxious.        Better mood      Objective:   Physical Exam Constitutional:      Appearance: Normal appearance. She is normal weight. She is not ill-appearing.  Eyes:     General: No scleral icterus.    Conjunctiva/sclera: Conjunctivae normal.     Pupils: Pupils are equal, round, and reactive to light.  Cardiovascular:     Rate and Rhythm: Normal rate and regular rhythm.     Heart sounds: Normal heart sounds.  Pulmonary:     Effort: Pulmonary effort is normal.     Breath sounds: Normal breath sounds.  Musculoskeletal:     Cervical back: Normal range of motion and neck supple.  Lymphadenopathy:     Cervical: No cervical adenopathy.  Skin:    General: Skin is warm and dry.     Coloration: Skin is not pale.  Neurological:     Mental Status: She is alert.     Deep Tendon Reflexes: Reflexes normal.     Comments: No tremor   Psychiatric:        Attention and Perception: Attention normal.        Mood and Affect: Mood normal.        Cognition and Memory: Cognition normal.     Comments: Improved mood/affect Animated/cheerful and talkative             Assessment & Plan:   Problem List Items Addressed This Visit       Cardiovascular and Mediastinum   ESSENTIAL HYPERTENSION, BENIGN - Primary     Other   Anxious depression    Much improved with fluoxetine 10 mg daily  Decided not to do counseling Reviewed stressors/ coping techniques/symptoms/ support sources/ tx options and side effects in detail today Disc upcoming stressors (return to office) and pt feels ok about it  Enc self care and exercise  Will continue medication likely for a year and re  assess inst to call/update if symptoms worsen or change       Relevant Medications   FLUoxetine (PROZAC) 10 MG capsule

## 2021-03-19 NOTE — Assessment & Plan Note (Signed)
Much improved with fluoxetine 10 mg daily  Decided not to do counseling Reviewed stressors/ coping techniques/symptoms/ support sources/ tx options and side effects in detail today Disc upcoming stressors (return to office) and pt feels ok about it  Enc self care and exercise  Will continue medication likely for a year and re assess inst to call/update if symptoms worsen or change

## 2021-03-19 NOTE — Patient Instructions (Addendum)
Im glad you are doing better! Continue the generic prozac for 12 months and re assess  Let me know if you want to get off of it earlier  Take care of yourself  Stay active   If something changes let us know

## 2021-03-26 DIAGNOSIS — J3081 Allergic rhinitis due to animal (cat) (dog) hair and dander: Secondary | ICD-10-CM | POA: Diagnosis not present

## 2021-03-26 DIAGNOSIS — J3089 Other allergic rhinitis: Secondary | ICD-10-CM | POA: Diagnosis not present

## 2021-03-26 DIAGNOSIS — J301 Allergic rhinitis due to pollen: Secondary | ICD-10-CM | POA: Diagnosis not present

## 2021-04-03 DIAGNOSIS — J301 Allergic rhinitis due to pollen: Secondary | ICD-10-CM | POA: Diagnosis not present

## 2021-04-03 DIAGNOSIS — J3089 Other allergic rhinitis: Secondary | ICD-10-CM | POA: Diagnosis not present

## 2021-04-03 DIAGNOSIS — J3081 Allergic rhinitis due to animal (cat) (dog) hair and dander: Secondary | ICD-10-CM | POA: Diagnosis not present

## 2021-04-15 DIAGNOSIS — J3081 Allergic rhinitis due to animal (cat) (dog) hair and dander: Secondary | ICD-10-CM | POA: Diagnosis not present

## 2021-04-15 DIAGNOSIS — J301 Allergic rhinitis due to pollen: Secondary | ICD-10-CM | POA: Diagnosis not present

## 2021-04-15 DIAGNOSIS — J3089 Other allergic rhinitis: Secondary | ICD-10-CM | POA: Diagnosis not present

## 2021-04-29 DIAGNOSIS — J3089 Other allergic rhinitis: Secondary | ICD-10-CM | POA: Diagnosis not present

## 2021-04-29 DIAGNOSIS — J3081 Allergic rhinitis due to animal (cat) (dog) hair and dander: Secondary | ICD-10-CM | POA: Diagnosis not present

## 2021-04-29 DIAGNOSIS — J301 Allergic rhinitis due to pollen: Secondary | ICD-10-CM | POA: Diagnosis not present

## 2021-05-06 ENCOUNTER — Other Ambulatory Visit: Payer: Self-pay | Admitting: Family Medicine

## 2021-05-11 DIAGNOSIS — J3081 Allergic rhinitis due to animal (cat) (dog) hair and dander: Secondary | ICD-10-CM | POA: Diagnosis not present

## 2021-05-11 DIAGNOSIS — J301 Allergic rhinitis due to pollen: Secondary | ICD-10-CM | POA: Diagnosis not present

## 2021-05-11 DIAGNOSIS — J3089 Other allergic rhinitis: Secondary | ICD-10-CM | POA: Diagnosis not present

## 2021-05-27 DIAGNOSIS — J3089 Other allergic rhinitis: Secondary | ICD-10-CM | POA: Diagnosis not present

## 2021-05-27 DIAGNOSIS — J301 Allergic rhinitis due to pollen: Secondary | ICD-10-CM | POA: Diagnosis not present

## 2021-05-27 DIAGNOSIS — J3081 Allergic rhinitis due to animal (cat) (dog) hair and dander: Secondary | ICD-10-CM | POA: Diagnosis not present

## 2021-06-09 DIAGNOSIS — J301 Allergic rhinitis due to pollen: Secondary | ICD-10-CM | POA: Diagnosis not present

## 2021-06-09 DIAGNOSIS — H1045 Other chronic allergic conjunctivitis: Secondary | ICD-10-CM | POA: Diagnosis not present

## 2021-06-09 DIAGNOSIS — J3081 Allergic rhinitis due to animal (cat) (dog) hair and dander: Secondary | ICD-10-CM | POA: Diagnosis not present

## 2021-06-09 DIAGNOSIS — J3089 Other allergic rhinitis: Secondary | ICD-10-CM | POA: Diagnosis not present

## 2021-11-12 ENCOUNTER — Ambulatory Visit: Payer: Federal, State, Local not specified - PPO | Admitting: Obstetrics

## 2021-11-12 ENCOUNTER — Encounter: Payer: Self-pay | Admitting: Obstetrics

## 2021-11-12 ENCOUNTER — Other Ambulatory Visit: Payer: Self-pay

## 2021-11-12 ENCOUNTER — Other Ambulatory Visit (HOSPITAL_COMMUNITY)
Admission: RE | Admit: 2021-11-12 | Discharge: 2021-11-12 | Disposition: A | Discharge status: Home / Self Care | Payer: Federal, State, Local not specified - PPO | Source: Ambulatory Visit | Attending: Obstetrics | Admitting: Obstetrics

## 2021-11-12 VITALS — BP 153/99 | HR 87 | Ht 65.0 in | Wt 169.7 lb

## 2021-11-12 DIAGNOSIS — R87610 Atypical squamous cells of undetermined significance on cytologic smear of cervix (ASC-US): Secondary | ICD-10-CM | POA: Insufficient documentation

## 2021-11-12 DIAGNOSIS — Z01419 Encounter for gynecological examination (general) (routine) without abnormal findings: Secondary | ICD-10-CM | POA: Diagnosis not present

## 2021-11-12 DIAGNOSIS — B851 Pediculosis due to Pediculus humanus corporis: Secondary | ICD-10-CM | POA: Insufficient documentation

## 2021-11-12 DIAGNOSIS — Z113 Encounter for screening for infections with a predominantly sexual mode of transmission: Secondary | ICD-10-CM | POA: Insufficient documentation

## 2021-11-12 DIAGNOSIS — N898 Other specified noninflammatory disorders of vagina: Secondary | ICD-10-CM | POA: Insufficient documentation

## 2021-11-12 LAB — POCT URINALYSIS DIPSTICK
Bilirubin, UA: NEGATIVE
Blood, UA: NEGATIVE
Glucose, UA: NEGATIVE
Ketones, UA: NEGATIVE
Leukocytes, UA: NEGATIVE
Nitrite, UA: NEGATIVE
Protein, UA: NEGATIVE
Spec Grav, UA: 1.015 (ref 1.010–1.025)
Urobilinogen, UA: 0.2 E.U./dL
pH, UA: 6.5 (ref 5.0–8.0)

## 2021-11-12 NOTE — Progress Notes (Signed)
Patient presents for having vaginal discharge that has been present for about a couple of weeks. Also complains of having vaginal irritation. Patient would like STD testing.  Last pap: 12/05/2019 Normal

## 2021-11-12 NOTE — Progress Notes (Signed)
Subjective:        [name] is a 61 y.o. female here for a routine exam.  Current complaints: Vaginal discharge and irritation.    Personal health questionnaire:  Is patient [name], have a family history of breast and/or ovarian cancer: no Is there a family history of uterine cancer diagnosed at age < 16, gastrointestinal cancer, urinary tract cancer, family member who is a Field seismologist syndrome-associated carrier: yes Is the patient overweight and hypertensive, family history of diabetes, personal history of gestational diabetes, preeclampsia or PCOS: no Is patient over 47, have PCOS,  family history of premature CHD under age 23, diabetes, smoke, have hypertension or peripheral artery disease:  no At any time, has a partner hit, kicked or otherwise hurt or frightened you?: no Over the past 2 weeks, have you felt down, depressed or hopeless?: no Over the past 2 weeks, have you felt little interest or pleasure in doing things?:no   Gynecologic History Patient's last menstrual period was 01/25/2012. Contraception: post menopausal status Last Pap: 01-02-2020. Results were: normal Last mammogram: 12-29-2020. Results were: normal  Obstetric History OB History  Gravida Para Term Preterm AB Living  2 2 2     2   SAB IAB Ectopic Multiple Live Births          2    # Outcome Date GA Lbr Len/2nd Weight Sex Delivery Anes PTL Lv  2 Term 1995    F Vag-Spont   LIV  1 Term 1993    F Vag-Spont   LIV    Past Medical History:  Diagnosis Date   Abnormal finding on Pap smear 2006   endometrial cells, biopsy ok   Anxiety    Fibroids    uterine   Herpes zoster    mild case   History of pelvic ultrasound 10/2007   uterine fibroids   Hyperlipidemia    Hypertension    with some white coat component    Past Surgical History:  Procedure Laterality Date   COLONOSCOPY     CRYOABLATION  1982   abnormal pap   INTRAUTERINE DEVICE INSERTION  2006   MENISCUS REPAIR Right 2002, 03/07/2015    WISDOM TOOTH EXTRACTION       Current Outpatient Medications:    hydrochlorothiazide (MICROZIDE) 12.5 MG capsule, TAKE 1 CAPSULE BY MOUTH EVERY DAY IN THE MORNING, Disp: 90 capsule, Rfl: 2   Multiple Vitamin (MULTIVITAMIN) capsule, Take 1 capsule by mouth daily., Disp: , Rfl:    SODIUM FLUORIDE 5000 PPM 1.1 % GEL dental gel, PLEASE SEE ATTACHED FOR DETAILED DIRECTIONS, Disp: , Rfl:  No Known Allergies  Social History   Tobacco Use   Smoking status: Never   Smokeless tobacco: Never  Substance Use Topics   Alcohol use: Yes    Comment: occ    Family History  Problem Relation Age of Onset   Heart disease Mother    Heart attack Mother        stent   Hyperlipidemia Mother    Colon cancer Mother 109       deceased 03/12/2014   Hypertension Mother    Stroke Mother    Glaucoma Father    Goiter Daughter       Review of Systems  Constitutional: negative for fatigue and weight loss Respiratory: negative for cough and wheezing Cardiovascular: negative for chest pain, fatigue and palpitations Gastrointestinal: negative for abdominal pain and change in bowel habits Musculoskeletal:negative for myalgias Neurological: negative for gait problems and  tremors Behavioral/Psych: negative for abusive relationship, depression Endocrine:   negative for temperature intolerance    Genitourinary:  positive for vaginal discharge with irritation.  negative for abnormal menstrual periods, genital lesions, hot flashes, sexual problems  Integument/breast: negative for breast lump, breast tenderness, nipple discharge and skin lesion(s)    Objective:       BP (!) 153/99    Pulse 87    Ht 5\' 5"  (1.651 m)    Wt 169 lb 11.2 oz (77 kg)    LMP 01/25/2012    BMI 28.24 kg/m  General:   Alert and no distress  Skin:   no rash or abnormalities  Lungs:   clear to auscultation bilaterally  Heart:   regular rate and rhythm, S1, S2 normal, no murmur, click, rub or gallop  Breasts:   normal without suspicious  masses, skin or nipple changes or axillary nodes  Abdomen:  normal findings: no organomegaly, soft, non-tender and no hernia  Pelvis:  External genitalia: normal general appearance Urinary system: urethral meatus normal and bladder without fullness, nontender Vaginal: normal without tenderness, induration or masses Cervix: normal appearance Adnexa: normal bimanual exam Uterus: anteverted and non-tender, normal size   Lab Review Urine pregnancy test Labs reviewed yes Radiologic studies reviewed yes  I have spent a total of 20 minutes of face-to-face time, excluding clinical staff time, reviewing notes and preparing to see patient, ordering tests and/or medications, and counseling the patient.   Assessment:    1. Encounter for routine gynecological examination with Papanicolaou smear of cervix Rx: - Cytology - PAP( [facility]) - POCT Urinalysis Dipstick  2. Vaginal discharge Rx: - Cervicovaginal ancillary only( [facility])    Plan:    Education reviewed: calcium supplements, depression evaluation, low fat, low cholesterol diet, safe sex/STD prevention, self breast exams, and weight bearing exercise. Follow up in: 1 year.    Orders Placed This Encounter  Procedures   POCT Urinalysis Dipstick     Shelly Bombard, MD 11/17/2021 5:06 AM

## 2021-11-13 LAB — CERVICOVAGINAL ANCILLARY ONLY
Bacterial Vaginitis (gardnerella): NEGATIVE
Candida Glabrata: NEGATIVE
Candida Vaginitis: NEGATIVE
Chlamydia: NEGATIVE
Comment: NEGATIVE
Comment: NEGATIVE
Comment: NEGATIVE
Comment: NEGATIVE
Comment: NEGATIVE
Comment: NORMAL
Neisseria Gonorrhea: NEGATIVE
Trichomonas: NEGATIVE

## 2021-11-16 LAB — CYTOLOGY - PAP
Comment: NEGATIVE
Diagnosis: UNDETERMINED — AB
High risk HPV: NEGATIVE

## 2021-11-18 ENCOUNTER — Encounter: Payer: Self-pay | Admitting: Obstetrics

## 2021-12-08 ENCOUNTER — Other Ambulatory Visit: Payer: Self-pay | Admitting: Family Medicine

## 2021-12-08 DIAGNOSIS — Z1231 Encounter for screening mammogram for malignant neoplasm of breast: Secondary | ICD-10-CM

## 2021-12-30 ENCOUNTER — Ambulatory Visit
Admission: RE | Admit: 2021-12-30 | Discharge: 2021-12-30 | Disposition: A | Discharge status: Home / Self Care | Payer: Federal, State, Local not specified - PPO | Source: Ambulatory Visit | Attending: Family Medicine | Admitting: Family Medicine

## 2021-12-30 DIAGNOSIS — Z1231 Encounter for screening mammogram for malignant neoplasm of breast: Secondary | ICD-10-CM

## 2022-01-04 ENCOUNTER — Ambulatory Visit: Payer: Federal, State, Local not specified - PPO | Admitting: Obstetrics and Gynecology

## 2022-01-20 ENCOUNTER — Encounter: Payer: Self-pay | Admitting: Gastroenterology

## 2022-02-08 ENCOUNTER — Telehealth: Payer: Self-pay | Admitting: Family Medicine

## 2022-02-08 DIAGNOSIS — E7800 Pure hypercholesterolemia, unspecified: Secondary | ICD-10-CM

## 2022-02-08 DIAGNOSIS — I10 Essential (primary) hypertension: Secondary | ICD-10-CM

## 2022-02-08 NOTE — Telephone Encounter (Signed)
-----   Message from Velna Hatchet, RT sent at 01/25/2022  9:12 AM EDT ----- ?Regarding: Lab Tue 02/09/22 ?Patient is scheduled for cpx, please order future labs.  Thanks, Anda Kraft  ? ?

## 2022-02-09 ENCOUNTER — Other Ambulatory Visit (INDEPENDENT_AMBULATORY_CARE_PROVIDER_SITE_OTHER): Payer: Federal, State, Local not specified - PPO

## 2022-02-09 DIAGNOSIS — I10 Essential (primary) hypertension: Secondary | ICD-10-CM | POA: Diagnosis not present

## 2022-02-09 DIAGNOSIS — E7800 Pure hypercholesterolemia, unspecified: Secondary | ICD-10-CM | POA: Diagnosis not present

## 2022-02-09 LAB — CBC WITH DIFFERENTIAL/PLATELET
Basophils Absolute: 0 10*3/uL (ref 0.0–0.1)
Basophils Relative: 0.6 % (ref 0.0–3.0)
Eosinophils Absolute: 0.1 10*3/uL (ref 0.0–0.7)
Eosinophils Relative: 1.5 % (ref 0.0–5.0)
HCT: 40.7 % (ref 36.0–46.0)
Hemoglobin: 13.6 g/dL (ref 12.0–15.0)
Lymphocytes Relative: 29.3 % (ref 12.0–46.0)
Lymphs Abs: 1.8 10*3/uL (ref 0.7–4.0)
MCHC: 33.4 g/dL (ref 30.0–36.0)
MCV: 97.1 fl (ref 78.0–100.0)
Monocytes Absolute: 0.5 10*3/uL (ref 0.1–1.0)
Monocytes Relative: 7.8 % (ref 3.0–12.0)
Neutro Abs: 3.7 10*3/uL (ref 1.4–7.7)
Neutrophils Relative %: 60.8 % (ref 43.0–77.0)
Platelets: 214 10*3/uL (ref 150.0–400.0)
RBC: 4.19 Mil/uL (ref 3.87–5.11)
RDW: 12.6 % (ref 11.5–15.5)
WBC: 6.2 10*3/uL (ref 4.0–10.5)

## 2022-02-09 LAB — COMPREHENSIVE METABOLIC PANEL WITH GFR
ALT: 8 U/L (ref 0–35)
AST: 21 U/L (ref 0–37)
Albumin: 4 g/dL (ref 3.5–5.2)
Alkaline Phosphatase: 100 U/L (ref 39–117)
BUN: 11 mg/dL (ref 6–23)
CO2: 31 meq/L (ref 19–32)
Calcium: 9.4 mg/dL (ref 8.4–10.5)
Chloride: 100 meq/L (ref 96–112)
Creatinine, Ser: 1.19 mg/dL (ref 0.40–1.20)
GFR: 48.73 mL/min — ABNORMAL LOW
Glucose, Bld: 90 mg/dL (ref 70–99)
Potassium: 3.5 meq/L (ref 3.5–5.1)
Sodium: 139 meq/L (ref 135–145)
Total Bilirubin: 1.2 mg/dL (ref 0.2–1.2)
Total Protein: 7.3 g/dL (ref 6.0–8.3)

## 2022-02-09 LAB — LIPID PANEL
Cholesterol: 232 mg/dL — ABNORMAL HIGH (ref 0–200)
HDL: 80.9 mg/dL (ref >39.00)
LDL Cholesterol: 130 mg/dL — ABNORMAL HIGH (ref 0–99)
NonHDL: 151.13
Total CHOL/HDL Ratio: 3
Triglycerides: 105 mg/dL (ref 0.0–149.0)
VLDL: 21 mg/dL (ref 0.0–40.0)

## 2022-02-09 LAB — TSH: TSH: 2.36 u[IU]/mL (ref 0.35–5.50)

## 2022-02-15 ENCOUNTER — Encounter: Payer: Self-pay | Admitting: Family Medicine

## 2022-02-15 ENCOUNTER — Ambulatory Visit (INDEPENDENT_AMBULATORY_CARE_PROVIDER_SITE_OTHER): Payer: Federal, State, Local not specified - PPO | Admitting: Family Medicine

## 2022-02-15 VITALS — BP 136/85 | HR 79 | Temp 97.6°F | Ht 64.5 in | Wt 171.6 lb

## 2022-02-15 DIAGNOSIS — I10 Essential (primary) hypertension: Secondary | ICD-10-CM

## 2022-02-15 DIAGNOSIS — E663 Overweight: Secondary | ICD-10-CM

## 2022-02-15 DIAGNOSIS — F418 Other specified anxiety disorders: Secondary | ICD-10-CM

## 2022-02-15 DIAGNOSIS — Z0000 Encounter for general adult medical examination without abnormal findings: Secondary | ICD-10-CM | POA: Diagnosis not present

## 2022-02-15 DIAGNOSIS — Z800 Family history of malignant neoplasm of digestive organs: Secondary | ICD-10-CM

## 2022-02-15 DIAGNOSIS — E7800 Pure hypercholesterolemia, unspecified: Secondary | ICD-10-CM

## 2022-02-15 MED ORDER — HYDROCHLOROTHIAZIDE 12.5 MG PO CAPS: ORAL_CAPSULE | ORAL | 3 refills | Status: DC

## 2022-02-15 NOTE — Progress Notes (Signed)
? ?Subjective:  ? ? Patient ID: [name], female    DOB: [birth year], 61 y.o.   MRN: [id number] ? ?HPI ?Here for health maintenance exam and to review chronic medical problems   ? ?Wt Readings from Last 3 Encounters:  ?02/15/22 171 lb 9.6 oz (77.8 kg)  ?11/12/21 169 lb 11.2 oz (77 kg)  ?03/19/21 168 lb 5 oz (76.3 kg)  ? ?29.00 kg/m? ? ?Feeling good overall  ?[facility]  ?Strength training  ? ?Takes mvi daily  ?Vit D 1000 iu daily  ? ?Immunization History  ?Administered Date(s) Administered  ? Influenza Split 10/10/2012  ? Influenza,inj,Quad PF,6+ Mos 10/17/2013, 11/15/2016, 11/28/2017, 11/30/2018, 07/13/2019  ? Influenza-Unspecified 07/12/2014  ? PFIZER Comirnaty(Gray Top)Covid-19 Tri-Sucrose Vaccine 03/17/2021  ? PFIZER(Purple Top)SARS-COV-2 Vaccination 12/21/2019, 01/15/2020, 08/29/2020  ? Td 10/11/2001  ? Tdap 10/10/2012  ? ? ?Zoster status : declines  ? ?Colonoscopy 12/2016 with 5 y recall - due now/has a letter and will call and schedule  ?No stool changes  ?Mother had colon cancer in her 69s ? ?Mammogram 12/2021 ?Self breast exam: no lumps or changes  ? ?Pap 11/2021 at gyn : ascus with neg HPV, recall 1 year  ? ? ?HTN ?bp is stable today  ?No cp or palpitations or headaches or edema  ?No side effects to medicines  ?BP Readings from Last 3 Encounters:  ?02/15/22 (!) 142/88  ?11/12/21 (!) 153/99  ?03/19/21 (!) 142/82  ?   ?Hctz 12.5 mg daily  ?At home 120s usually / over 80s  ? ?Does check at home, and it is good  ?Takes hawthorn berry  ? ?Eating beets/drinks beet juice also  ? ?Lab Results  ?Component Value Date  ? CREATININE 1.19 02/09/2022  ? BUN 11 02/09/2022  ? NA 139 02/09/2022  ? K 3.5 02/09/2022  ? CL 100 02/09/2022  ? CO2 31 02/09/2022  ? ? ?Mood is better  ?Was previously on fluoxetine 10 mg daily  ?Doing much better  ?Off of it  ?Managing stress better  ? ?Hyperlipidemia ?Lab Results  ?Component Value Date  ? CHOL 232 (H) 02/09/2022  ? CHOL 204 (H) 12/24/2020  ? CHOL 207  (H) 11/28/2019  ? ?Lab Results  ?Component Value Date  ? HDL 80.90 02/09/2022  ? HDL 75.70 12/24/2020  ? HDL 76.80 11/28/2019  ? ?Lab Results  ?Component Value Date  ? LDLCALC 130 (H) 02/09/2022  ? LDLCALC 110 (H) 12/24/2020  ? LDLCALC 112 (H) 11/28/2019  ? ?Lab Results  ?Component Value Date  ? TRIG 105.0 02/09/2022  ? TRIG 93.0 12/24/2020  ? TRIG 91.0 11/28/2019  ? ?Lab Results  ?Component Value Date  ? CHOLHDL 3 02/09/2022  ? CHOLHDL 3 12/24/2020  ? CHOLHDL 3 11/28/2019  ? ?Lab Results  ?Component Value Date  ? LDLDIRECT 128.6 10/17/2013  ? LDLDIRECT 128.8 10/02/2012  ? LDLDIRECT 129.8 02/11/2011  ?  ?Diet controlled  ?LDL is up  ?She has cream in her coffee  ? ?Lab Results  ?Component Value Date  ? ALT 8 02/09/2022  ? AST 21 02/09/2022  ? ALKPHOS 100 02/09/2022  ? BILITOT 1.2 02/09/2022  ? ?Lab Results  ?Component Value Date  ? WBC 6.2 02/09/2022  ? HGB 13.6 02/09/2022  ? HCT 40.7 02/09/2022  ? MCV 97.1 02/09/2022  ? PLT 214.0 02/09/2022  ? ?Lab Results  ?Component Value Date  ? TSH 2.36 02/09/2022  ? ? ?Patient Active Problem List  ? Diagnosis  Date Noted  ? 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 GERD (gastroesophageal reflux disease) 12/03/2019  ? Allergic rhinitis 08/02/2019  ? Rhinorrhea 11/15/2016  ? Overweight (BMI 25.0-29.9) 11/15/2016  ? Family history of colon cancer 05/14/2015  ? Travel advice encounter 01/31/2015  ? Routine general medical examination at a health care facility 10/01/2012  ? ESSENTIAL HYPERTENSION, BENIGN 01/29/2010  ? Hyperlipidemia 10/09/2007  ? OSTEOARTHRITIS, KNEE 10/09/2007  ? ?Past Medical History:  ?Diagnosis Date  ? Abnormal finding on Pap smear 2006  ? endometrial cells, biopsy ok  ? Anxiety   ? Fibroids   ? uterine  ? Herpes zoster   ? mild case  ? History of pelvic ultrasound 10/2007  ? uterine fibroids  ? Hyperlipidemia   ? Hypertension   ? with some white coat component  ? ?Past Surgical History:  ?Procedure Laterality Date  ? COLONOSCOPY    ? CRYOABLATION  1982  ? abnormal pap  ?  INTRAUTERINE DEVICE INSERTION  2006  ? MENISCUS REPAIR Right 2002, 03/07/2015  ? WISDOM TOOTH EXTRACTION    ? ?Social History  ? ?Tobacco Use  ? Smoking status: Never  ? Smokeless tobacco: Never  ?Vaping Use  ? Vaping Use: Never used  ?Substance Use Topics  ? Alcohol use: Yes  ?  Comment: occ  ? Drug use: No  ? ?Family History  ?Problem Relation Age of Onset  ? Heart disease Mother   ? Heart attack Mother   ?     stent  ? Hyperlipidemia Mother   ? Colon cancer Mother 11  ?     deceased 03/12/14  ? Hypertension Mother   ? Stroke Mother   ? Glaucoma Father   ? Goiter Daughter   ? ?No Known Allergies ?Current Outpatient Medications on File Prior to Visit  ?Medication Sig Dispense Refill  ? Multiple Vitamin (MULTIVITAMIN) capsule Take 1 capsule by mouth daily.    ? SODIUM FLUORIDE 5000 PPM 1.1 % GEL dental gel PLEASE SEE ATTACHED FOR DETAILED DIRECTIONS    ? ?No current facility-administered medications on file prior to visit.  ?  ? ?Review of Systems  ?Constitutional:  Negative for activity change, appetite change, fatigue, fever and unexpected weight change.  ?HENT:  Negative for congestion, ear pain, rhinorrhea, sinus pressure and sore throat.   ?Eyes:  Negative for pain, redness and visual disturbance.  ?Respiratory:  Negative for cough, shortness of breath and wheezing.   ?Cardiovascular:  Negative for chest pain and palpitations.  ?Gastrointestinal:  Negative for abdominal pain, blood in stool, constipation and diarrhea.  ?Endocrine: Negative for polydipsia and polyuria.  ?Genitourinary:  Negative for dysuria, frequency and urgency.  ?Musculoskeletal:  Negative for arthralgias, back pain and myalgias.  ?Skin:  Negative for pallor and rash.  ?Allergic/Immunologic: Negative for environmental allergies.  ?Neurological:  Negative for dizziness, syncope and headaches.  ?Hematological:  Negative for adenopathy. Does not bruise/bleed easily.  ?Psychiatric/Behavioral:  Negative for decreased concentration and dysphoric mood.  The patient is not nervous/anxious.   ? ?   ?Objective:  ? Physical Exam ?Constitutional:   ?   General: She is not in acute distress. ?   Appearance: Normal appearance. She is well-developed and normal weight. She is not ill-appearing or diaphoretic.  ?HENT:  ?   Head: Normocephalic and atraumatic.  ?   Right Ear: Tympanic membrane, ear canal and external ear normal.  ?   Left Ear: Tympanic membrane, ear canal and external ear normal.  ?   Nose:  Nose normal. No congestion.  ?   Mouth/Throat:  ?   Mouth: Mucous membranes are moist.  ?   Pharynx: Oropharynx is clear. No posterior oropharyngeal erythema.  ?Eyes:  ?   General: No scleral icterus. ?   Extraocular Movements: Extraocular movements intact.  ?   Conjunctiva/sclera: Conjunctivae normal.  ?   Pupils: Pupils are equal, round, and reactive to light.  ?Neck:  ?   Thyroid: No thyromegaly.  ?   Vascular: No carotid bruit or JVD.  ?Cardiovascular:  ?   Rate and Rhythm: Normal rate and regular rhythm.  ?   Pulses: Normal pulses.  ?   Heart sounds: Normal heart sounds.  ?  No gallop.  ?Pulmonary:  ?   Effort: Pulmonary effort is normal. No respiratory distress.  ?   Breath sounds: Normal breath sounds. No wheezing.  ?   Comments: Good air exch ?Chest:  ?   Chest wall: No tenderness.  ?Abdominal:  ?   General: Bowel sounds are normal. There is no distension or abdominal bruit.  ?   Palpations: Abdomen is soft. There is no mass.  ?   Tenderness: There is no abdominal tenderness.  ?   Hernia: No hernia is present.  ?Genitourinary: ?   Comments: Breast exam: No mass, nodules, thickening, tenderness, bulging, retraction, inflamation, nipple discharge or skin changes noted.  No axillary or clavicular LA.     ?Musculoskeletal:     ?   General: No tenderness. Normal range of motion.  ?   Cervical back: Normal range of motion and neck supple. No rigidity. No muscular tenderness.  ?   Right lower leg: No edema.  ?   Left lower leg: No edema.  ?   Comments: No kyphosis    ?Lymphadenopathy:  ?   Cervical: No cervical adenopathy.  ?Skin: ?   General: Skin is warm and dry.  ?   Coloration: Skin is not pale.  ?   Findings: No erythema or rash.  ?   Comments: Some skin tags  ?

## 2022-02-15 NOTE — Patient Instructions (Addendum)
Please call and schedule your colonoscopy  ? ?For cholesterol ?Avoid red meat/ fried foods/ egg yolks/ fatty breakfast meats/ butter, cheese and high fat dairy/ and shellfish   ? ?Let's re check cholesterol in 3 months  ? ?Take care of yourself  ? ?Get a flu shot in the fall  ? ?If you change your mind about a shingles vaccine : ? ?If you are interested in the shingles vaccine series (Shingrix), call your insurance or pharmacy to check on coverage and location it must be given.  If affordable - you can schedule it here or at your pharmacy depending on coverage  ? ? ? ? ? ?

## 2022-02-15 NOTE — Assessment & Plan Note (Signed)
She is due for 5 y colonoscopy  ?She has letter and will call to schedule ?

## 2022-02-15 NOTE — Assessment & Plan Note (Signed)
Disc goals for lipids and reasons to control them ?Rev last labs with pt ?Rev low sat fat diet in detail ?LDL is up but ratio is still good  ?Good exercise  ?Needs to cut back on fatty dairy ?Handout given ?Re check 3 months  ?

## 2022-02-15 NOTE — Assessment & Plan Note (Signed)
Reviewed health habits including diet and exercise and skin cancer prevention ?Reviewed appropriate screening tests for age  ?Also reviewed health mt list, fam hx and immunization status , as well as social and family history   ?See HPI ?Labs reviewed  ?Encouraged vit D and exercise  ?Gyn visit utd ?Declines shingrix vaccine for now/discussed  ?Enc flu shot in the fall  ?Mammogram utd  ?Colonoscopy due/plans to call to schedule  ?Discussed low cholesterol diet  ? ?

## 2022-02-15 NOTE — Assessment & Plan Note (Signed)
bp in fair control at this time  ?BP Readings from Last 1 Encounters:  ?02/15/22 136/85  ? ?No changes needed ?Most recent labs reviewed  ?Disc lifstyle change with low sodium diet and exercise  ?Plan to continue hctz 12.5 mg daily  ?Home bp run in 120s/80s usually ?

## 2022-02-15 NOTE — Assessment & Plan Note (Signed)
Great exercise  ?Enc diet with lean protein and produce ? ?Discussed how this problem influences overall health and the risks it imposes  ?Reviewed plan for weight loss with lower calorie diet (via better food choices and also portion control or program like weight watchers) and exercise building up to or more than 30 minutes 5 days per week including some aerobic activity  ? ? ?

## 2022-02-15 NOTE — Assessment & Plan Note (Signed)
Doing much better ?Handling stress differently  ?Reviewed stressors/ coping techniques/symptoms/ support sources/ tx options and side effects in detail today ? ?Off medication  ?Enc her to keep up exercise and good self care  ?

## 2022-02-17 ENCOUNTER — Encounter: Payer: Self-pay | Admitting: Gastroenterology

## 2022-03-22 ENCOUNTER — Ambulatory Visit (AMBULATORY_SURGERY_CENTER): Payer: Self-pay | Admitting: *Deleted

## 2022-03-22 VITALS — Ht 65.0 in | Wt 174.0 lb

## 2022-03-22 DIAGNOSIS — Z800 Family history of malignant neoplasm of digestive organs: Secondary | ICD-10-CM

## 2022-03-22 MED ORDER — PLENVU 140 G PO SOLR: 1.0000 | ORAL | 0 refills | Status: DC

## 2022-03-22 NOTE — Progress Notes (Signed)
No egg or soy allergy known to patient  No issues known to pt with past sedation with any surgeries or procedures Patient denies ever being told they had issues or difficulty with intubation  No FH of Malignant Hyperthermia Pt is not on diet pills Pt is not on  home 02  Pt is not on blood thinners  Pt denies issues with constipation  No A fib or A flutter  Plenvu Coupon to pt in PV today , Code to Pharmacy and  NO PA's for preps discussed with pt In PV today  Discussed with pt there will be an out-of-pocket cost for prep and that varies from $0 to 70 +  dollars - pt verbalized understanding    PV completed in person. Pt verified name, DOB, address and insurance during PV today.  Pt given instruction packet with consent form to read and sign after procedure and instructions explained and questions answered. Pt encouraged to call with questions or issues.  If pt has My chart, procedure instructions sent via My Chart

## 2022-04-22 ENCOUNTER — Ambulatory Visit (AMBULATORY_SURGERY_CENTER): Payer: Federal, State, Local not specified - PPO | Admitting: Gastroenterology

## 2022-04-22 ENCOUNTER — Encounter: Payer: Self-pay | Admitting: Gastroenterology

## 2022-04-22 VITALS — BP 141/88 | HR 55 | Temp 96.6°F | Resp 16 | Ht 64.5 in | Wt 174.0 lb

## 2022-04-22 DIAGNOSIS — Z800 Family history of malignant neoplasm of digestive organs: Secondary | ICD-10-CM | POA: Diagnosis not present

## 2022-04-22 DIAGNOSIS — Z1211 Encounter for screening for malignant neoplasm of colon: Secondary | ICD-10-CM

## 2022-04-22 DIAGNOSIS — D123 Benign neoplasm of transverse colon: Secondary | ICD-10-CM | POA: Diagnosis not present

## 2022-04-22 MED ORDER — SODIUM CHLORIDE 0.9 % IV SOLN: 500.0000 mL | Freq: Once | INTRAVENOUS | Status: DC

## 2022-04-22 NOTE — Progress Notes (Signed)
Pt's states no medical or surgical changes since previsit or office visit. 

## 2022-04-22 NOTE — Progress Notes (Signed)
PT taken to PACU. Monitors in place. VSS. Report given to RN. 

## 2022-04-22 NOTE — Patient Instructions (Signed)

## 2022-04-22 NOTE — Progress Notes (Signed)
[facility] History and Physical   Primary Care Physician:  Tower, Wynelle Fanny, MD   Reason for Procedure:  Family history of colon cancer  Plan:    Screening colonoscopy with possible interventions as needed     HPI: [name] is a very pleasant 61 y.o. female here for screening colonoscopy. Denies any nausea, vomiting, abdominal pain, melena or bright red blood per rectum  The risks and benefits as well as alternatives of endoscopic procedure(s) have been discussed and reviewed. All questions answered. The patient agrees to proceed.    Past Medical History:  Diagnosis Date   Abnormal finding on Pap smear 2006   endometrial cells, biopsy ok   Anxiety    Fibroids    uterine   Herpes zoster    mild case   History of pelvic ultrasound 10/2007   uterine fibroids   Hyperlipidemia    Hypertension    with some white coat component    Past Surgical History:  Procedure Laterality Date   COLONOSCOPY     CRYOABLATION  1982   abnormal pap   INTRAUTERINE DEVICE INSERTION  2006   MENISCUS REPAIR Right 2002, 03/07/2015   WISDOM TOOTH EXTRACTION      Prior to Admission medications   Medication Sig Start Date End Date Taking? Authorizing Provider  HAWTHORNE BERRY PO Take by mouth.   Yes [provider]  hydrochlorothiazide (MICROZIDE) 12.5 MG capsule TAKE 1 CAPSULE BY MOUTH EVERY DAY IN THE MORNING 02/15/22  Yes Tower, Wynelle Fanny, MD  Multiple Vitamin (MULTIVITAMIN) capsule Take 1 capsule by mouth daily.   Yes [provider]  Passion Flower-Valerian 500-500 MG CAPS Take by mouth.   Yes [provider]  SODIUM FLUORIDE 5000 PPM 1.1 % GEL dental gel PLEASE SEE ATTACHED FOR DETAILED DIRECTIONS 12/10/20  Yes [provider]    Current Outpatient Medications  Medication Sig Dispense Refill   HAWTHORNE BERRY PO Take by mouth.     hydrochlorothiazide (MICROZIDE) 12.5 MG capsule TAKE 1 CAPSULE BY MOUTH EVERY DAY IN THE MORNING 90 capsule 3    Multiple Vitamin (MULTIVITAMIN) capsule Take 1 capsule by mouth daily.     Passion Flower-Valerian 500-500 MG CAPS Take by mouth.     SODIUM FLUORIDE 5000 PPM 1.1 % GEL dental gel PLEASE SEE ATTACHED FOR DETAILED DIRECTIONS     Current Facility-Administered Medications  Medication Dose Route Frequency Provider Last Rate Last Admin   0.9 %  sodium chloride infusion  500 mL Intravenous Once Mauri Pole, MD        Allergies as of 04/22/2022   (No Known Allergies)    Family History  Problem Relation Age of Onset   Heart disease Mother    Heart attack Mother        stent   Hyperlipidemia Mother    Colon cancer Mother 79       deceased 03/02/2014   Hypertension Mother    Stroke Mother    Glaucoma Father    Goiter Daughter    Colon polyps Neg Hx    Esophageal cancer Neg Hx    Stomach cancer Neg Hx    Rectal cancer Neg Hx     Social History   Socioeconomic History   Marital status: Single    Spouse name: Not on file   Number of children: 2   Years of education: Not on file   Highest education level: Not on file  Occupational History   Occupation: Optometrist  Employer: [employer]  Tobacco Use   Smoking status: Never   Smokeless tobacco: Never  Vaping Use   Vaping Use: Never used  Substance and Sexual Activity   Alcohol use: Yes    Comment: occ   Drug use: No   Sexual activity: Yes    Partners: Male  Other Topics Concern   Not on file  Social History Narrative   Exercises 4 x a week, cardio and strength training   Social Determinants of Health   Financial Resource Strain: Not on file  Food Insecurity: Not on file  Transportation Needs: Not on file  Physical Activity: Not on file  Stress: Not on file  Social Connections: Not on file  Intimate Partner Violence: Not on file    Review of Systems:  All other review of systems negative except as mentioned in the HPI.  Physical Exam: Vital signs in last 24 hours: BP (!) 143/91   Pulse 60   Temp (!) 96.6 F  (35.9 C) (Temporal)   Ht 5' 4.5" (1.638 m)   Wt 174 lb (78.9 kg)   LMP 01/25/2012   SpO2 99%   BMI 29.41 kg/m  General:   Alert, NAD Lungs:  Clear .   Heart:  Regular rate and rhythm Abdomen:  Soft, nontender and nondistended. Neuro/Psych:  Alert and cooperative. Normal mood and affect. A and O x 3  Reviewed labs, radiology imaging, old records and pertinent past GI work up  Patient is appropriate for planned procedure(s) and anesthesia in an ambulatory setting   K. Denzil Magnuson , MD [phone]

## 2022-04-22 NOTE — Op Note (Signed)
[facility] Patient Name: [name] Procedure Date: 04/22/2022 8:30 AM MRN: [id number] Endoscopist: Mauri Pole , MD Age: 61 Referring MD:  Date of Birth: [birth year] Gender: Female Account #: [id number] Procedure:                Colonoscopy Indications:              Screening in patient at increased risk: Family                            history of 1st-degree relative with colorectal                            cancer Medicines:                Monitored Anesthesia Care Procedure:                Pre-Anesthesia Assessment:                           - Prior to the procedure, a History and Physical                            was performed, and patient medications and                            allergies were reviewed. The patient's tolerance of                            previous anesthesia was also reviewed. The risks                            and benefits of the procedure and the sedation                            options and risks were discussed with the patient.                            All questions were answered, and informed consent                            was obtained. Prior Anticoagulants: The patient has                            taken no previous anticoagulant or antiplatelet                            agents. ASA Grade Assessment: II - A patient with                            mild systemic disease. After reviewing the risks                            and benefits, the patient was deemed in  satisfactory condition to undergo the procedure.                           After obtaining informed consent, the colonoscope                            was passed under direct vision. Throughout the                            procedure, the patient's blood pressure, pulse, and                            oxygen saturations were monitored continuously. The                            PCF-HQ190L Colonoscope was introduced through the                             anus and advanced to the the cecum, identified by                            appendiceal orifice and ileocecal valve. The                            colonoscopy was performed without difficulty. The                            patient tolerated the procedure well. The quality                            of the bowel preparation was good. The ileocecal                            valve, appendiceal orifice, and rectum were                            photographed. Scope In: 8:41:35 AM Scope Out: 8:57:10 AM Scope Withdrawal Time: 0 hours 6 minutes 47 seconds  Total Procedure Duration: 0 hours 15 minutes 35 seconds  Findings:                 The perianal and digital rectal examinations were                            normal.                           Two sessile polyps were found in the transverse                            colon. The polyps were 3 to 4 mm in size. These                            polyps were removed with a cold snare. Resection  and retrieval were complete.                           Scattered small and large-mouthed diverticula were                            found in the sigmoid colon, descending colon,                            transverse colon and ascending colon.                           Non-bleeding external and internal hemorrhoids were                            found during retroflexion. The hemorrhoids were                            medium-sized. Complications:            No immediate complications. Estimated Blood Loss:     Estimated blood loss was minimal. Impression:               - Two 3 to 4 mm polyps in the transverse colon,                            removed with a cold snare. Resected and retrieved.                           - Moderate diverticulosis in the sigmoid colon, in                            the descending colon, in the transverse colon and                            in the ascending colon.                            - Non-bleeding external and internal hemorrhoids. Recommendation:           - Patient has a contact number available for                            emergencies. The signs and symptoms of potential                            delayed complications were discussed with the                            patient. Return to normal activities tomorrow.                            Written discharge instructions were provided to the                            patient.                           -  Resume previous diet.                           - Continue present medications.                           - Await pathology results.                           - Repeat colonoscopy in 5 years for surveillance                            based on pathology results. Mauri Pole, MD 04/22/2022 9:01:59 AM This report has been signed electronically.

## 2022-04-22 NOTE — Progress Notes (Signed)
Called to room to assist during endoscopic procedure.  Patient ID and intended procedure confirmed with present staff. Received instructions for my participation in the procedure from the performing physician.  

## 2022-04-23 ENCOUNTER — Telehealth: Payer: Self-pay | Admitting: *Deleted

## 2022-04-23 NOTE — Telephone Encounter (Signed)
  Follow up Call-     04/22/2022    8:02 AM  Call back number  Post procedure Call Back phone  # [phone]  Permission to leave phone message Yes     Patient questions:  Do you have a fever, pain , or abdominal swelling? No. Pain Score  0 *  Have you tolerated food without any problems? Yes.    Have you been able to return to your normal activities? Yes.    Do you have any questions about your discharge instructions: Diet   No. Medications  No. Follow up visit  No.  Do you have questions or concerns about your Care? No.  Actions: * If pain score is 4 or above: No action needed, pain <4.

## 2022-04-29 ENCOUNTER — Encounter: Payer: Self-pay | Admitting: Obstetrics and Gynecology

## 2022-04-29 ENCOUNTER — Ambulatory Visit: Payer: Federal, State, Local not specified - PPO | Admitting: Obstetrics and Gynecology

## 2022-04-29 VITALS — BP 127/87 | HR 67 | Ht 64.0 in | Wt 169.0 lb

## 2022-04-29 DIAGNOSIS — N941 Unspecified dyspareunia: Secondary | ICD-10-CM | POA: Diagnosis not present

## 2022-04-29 NOTE — Progress Notes (Signed)
61 yo P2 presenting today for evaluation of occasional vaginal spotting following intercourse. Patient reports not being in a relationship in over 8 years and has been very sexually active in the past few months. She reports some occasional vaginal dryness and discomfort with intercourse. She is without any other complaints  Past Medical History:  Diagnosis Date   Abnormal finding on Pap smear 2006   endometrial cells, biopsy ok   Anxiety    Fibroids    uterine   Herpes zoster    mild case   History of pelvic ultrasound 10/2007   uterine fibroids   Hyperlipidemia    Hypertension    with some white coat component   Past Surgical History:  Procedure Laterality Date   COLONOSCOPY     CRYOABLATION  1982   abnormal pap   INTRAUTERINE DEVICE INSERTION  2006   MENISCUS REPAIR Right 2002, 03/07/2015   WISDOM TOOTH EXTRACTION     Family History  Problem Relation Age of Onset   Heart disease Mother    Heart attack Mother        stent   Hyperlipidemia Mother    Colon cancer Mother 24       deceased 2014-03-10   Hypertension Mother    Stroke Mother    Glaucoma Father    Goiter Daughter    Colon polyps Neg Hx    Esophageal cancer Neg Hx    Stomach cancer Neg Hx    Rectal cancer Neg Hx    Social History   Tobacco Use   Smoking status: Never   Smokeless tobacco: Never  Vaping Use   Vaping Use: Never used  Substance Use Topics   Alcohol use: Yes    Comment: occ   Drug use: No   ROS See pertinent in HPI. All other systems reviewed and non contributory Blood pressure 127/87, pulse 67, height '5\' 4"'$  (1.626 m), weight 169 lb (76.7 kg), last menstrual period 01/25/2012. GENERAL: Well-developed, well-nourished female in no acute distress.  NEURO: alert and oriented x 3 Pelvic: patient declined exam  A/P 61 yo with dyspareunia and likely atrophic vaginitis - Patient is not interested in vaginal estrogen - Discussed the use of water base vaginal lubricant - Patient to return in 1-3  months if no improvement

## 2022-05-04 ENCOUNTER — Encounter: Payer: Self-pay | Admitting: Gastroenterology

## 2022-05-06 DIAGNOSIS — H40013 Open angle with borderline findings, low risk, bilateral: Secondary | ICD-10-CM | POA: Diagnosis not present

## 2022-05-18 ENCOUNTER — Other Ambulatory Visit: Payer: Federal, State, Local not specified - PPO

## 2022-06-07 ENCOUNTER — Encounter: Payer: Self-pay | Admitting: Obstetrics and Gynecology

## 2022-06-21 ENCOUNTER — Other Ambulatory Visit: Payer: Federal, State, Local not specified - PPO

## 2022-07-27 DIAGNOSIS — D485 Neoplasm of uncertain behavior of skin: Secondary | ICD-10-CM | POA: Diagnosis not present

## 2022-07-28 DIAGNOSIS — L739 Follicular disorder, unspecified: Secondary | ICD-10-CM | POA: Diagnosis not present

## 2022-07-28 DIAGNOSIS — L720 Epidermal cyst: Secondary | ICD-10-CM | POA: Diagnosis not present

## 2022-08-02 ENCOUNTER — Other Ambulatory Visit (INDEPENDENT_AMBULATORY_CARE_PROVIDER_SITE_OTHER): Payer: Federal, State, Local not specified - PPO

## 2022-08-02 DIAGNOSIS — E7800 Pure hypercholesterolemia, unspecified: Secondary | ICD-10-CM

## 2022-08-02 LAB — LIPID PANEL
Cholesterol: 205 mg/dL — ABNORMAL HIGH (ref 0–200)
HDL: 76.2 mg/dL (ref >39.00)
LDL Cholesterol: 111 mg/dL — ABNORMAL HIGH (ref 0–99)
NonHDL: 128.63
Total CHOL/HDL Ratio: 3
Triglycerides: 87 mg/dL (ref 0.0–149.0)
VLDL: 17.4 mg/dL (ref 0.0–40.0)

## 2022-09-17 ENCOUNTER — Encounter: Payer: Self-pay | Admitting: Family Medicine

## 2022-09-17 ENCOUNTER — Ambulatory Visit: Payer: Federal, State, Local not specified - PPO | Admitting: Family Medicine

## 2022-09-17 VITALS — BP 144/86 | HR 75 | Temp 97.5°F | Ht 64.0 in | Wt 169.8 lb

## 2022-09-17 DIAGNOSIS — L820 Inflamed seborrheic keratosis: Secondary | ICD-10-CM | POA: Insufficient documentation

## 2022-09-17 NOTE — Patient Instructions (Signed)
I treated your seb keratosis with liquid nitrogen (freezing) Keep area clean (soap and water)  and protected  It will blister up and then heal   If you have any redness or swelling that does not improve let us know  Don't submerge until healed  Tylenol is ok for pain   Update [place] if any concerns   Be safe Have a good trip

## 2022-09-17 NOTE — Progress Notes (Unsigned)
Subjective:    Patient ID: [name], female    DOB: 10/03/1961, 61 y.o.   MRN: [id number]  HPI Pt presents with a mole/skin lesion on her back   Wt Readings from Last 3 Encounters:  09/17/22 169 lb 12.8 oz (77 kg)  04/29/22 169 lb (76.7 kg)  04/22/22 174 lb (78.9 kg)   29.15 kg/m  Mole on her back - for years  Itching for 2 weeks  Trying not to scratch it   No topical products   Patient Active Problem List   Diagnosis Date Noted   Seborrheic keratoses, inflamed 09/17/2022   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   GERD (gastroesophageal reflux disease) 12/03/2019   Allergic rhinitis 08/02/2019   Rhinorrhea 11/15/2016   Overweight (BMI 25.0-29.9) 11/15/2016   Family history of colon cancer 05/14/2015   Travel advice encounter 01/31/2015   Routine general medical examination at a health care facility 10/01/2012   ESSENTIAL HYPERTENSION, BENIGN 01/29/2010   Hyperlipidemia 10/09/2007   OSTEOARTHRITIS, KNEE 10/09/2007   Past Medical History:  Diagnosis Date   Abnormal finding on Pap smear 2006   endometrial cells, biopsy ok   Anxiety    Fibroids    uterine   Herpes zoster    mild case   History of pelvic ultrasound 10/2007   uterine fibroids   Hyperlipidemia    Hypertension    with some white coat component   Past Surgical History:  Procedure Laterality Date   COLONOSCOPY     CRYOABLATION  1982   abnormal pap   INTRAUTERINE DEVICE INSERTION  2006   MENISCUS REPAIR Right 2002, 03/07/2015   WISDOM TOOTH EXTRACTION     Social History   Tobacco Use   Smoking status: Never   Smokeless tobacco: Never  Vaping Use   Vaping Use: Never used  Substance Use Topics   Alcohol use: Yes    Comment: occ   Drug use: No   Family History  Problem Relation Age of Onset   Heart disease Mother    Heart attack Mother        stent   Hyperlipidemia Mother    Colon cancer Mother 59       deceased 03-03-14   Hypertension Mother    Stroke Mother    Glaucoma Father    Goiter  Daughter    Colon polyps Neg Hx    Esophageal cancer Neg Hx    Stomach cancer Neg Hx    Rectal cancer Neg Hx    No Known Allergies Current Outpatient Medications on File Prior to Visit  Medication Sig Dispense Refill   HAWTHORNE BERRY PO Take by mouth.     hydrochlorothiazide (MICROZIDE) 12.5 MG capsule TAKE 1 CAPSULE BY MOUTH EVERY DAY IN THE MORNING 90 capsule 3   Multiple Vitamin (MULTIVITAMIN) capsule Take 1 capsule by mouth daily.     Passion Flower-Valerian 500-500 MG CAPS Take by mouth.     SODIUM FLUORIDE 5000 PPM 1.1 % GEL dental gel PLEASE SEE ATTACHED FOR DETAILED DIRECTIONS     No current facility-administered medications on file prior to visit.    Review of Systems  Skin:  Negative for pallor, rash and wound.       Lesion on back  Itchy and irritated        Objective:   Physical Exam Constitutional:      General: She is not in acute distress.    Appearance: Normal appearance. She is not ill-appearing.  Cardiovascular:  Rate and Rhythm: Normal rate and regular rhythm.  Musculoskeletal:        General: No swelling.  Lymphadenopathy:     Cervical: No cervical adenopathy.  Skin:    Comments: L flank area - nickel sized brown SK that is raised, rough, irritated and abraded from clothing  No s/s of infection   After consent obt and cleaning : tx with cryo tx (liquid nit) gun  Tolerated well full freeze and thaw Covered lightly   Neurological:     Mental Status: She is alert.     Sensory: No sensory deficit.  Psychiatric:        Mood and Affect: Mood normal.           Assessment & Plan:   Problem List Items Addressed This Visit       Musculoskeletal and Integument   Seborrheic keratoses, inflamed - Primary    Left flank area/in bra line  Pt desires treatment since area is abraded/irritated   After consent obtained we reviewed possible complications of cryotx including pain, redness and infection  Area treated with cryo gun times one full  freeze and thaw and she tolerated it well  Disc aftercare-keep clean and dry / can use abx oint if desired and cover to protect as needed Inst to let us know if this does not resp to tx  Inst not to submerge until fully healed

## 2022-09-19 NOTE — Assessment & Plan Note (Addendum)
Left flank area/in bra line  Pt desires treatment since area is abraded/irritated   After consent obtained we reviewed possible complications of cryotx including pain, redness and infection  Area treated with cryo gun times one full freeze and thaw and she tolerated it well  Disc aftercare-keep clean and dry / can use abx oint if desired and cover to protect as needed Inst to let us know if this does not resp to tx  Inst not to submerge until fully healed

## 2022-11-15 DIAGNOSIS — M25561 Pain in right knee: Secondary | ICD-10-CM | POA: Diagnosis not present

## 2022-11-15 DIAGNOSIS — M1711 Unilateral primary osteoarthritis, right knee: Secondary | ICD-10-CM | POA: Diagnosis not present

## 2022-12-01 ENCOUNTER — Other Ambulatory Visit: Payer: Self-pay | Admitting: Family Medicine

## 2022-12-01 DIAGNOSIS — Z1231 Encounter for screening mammogram for malignant neoplasm of breast: Secondary | ICD-10-CM

## 2023-01-13 ENCOUNTER — Ambulatory Visit
Admission: RE | Admit: 2023-01-13 | Discharge: 2023-01-13 | Disposition: A | Discharge status: Home / Self Care | Payer: Federal, State, Local not specified - PPO | Source: Ambulatory Visit | Attending: Family Medicine | Admitting: Family Medicine

## 2023-01-13 DIAGNOSIS — Z1231 Encounter for screening mammogram for malignant neoplasm of breast: Secondary | ICD-10-CM | POA: Diagnosis not present

## 2023-01-25 DIAGNOSIS — M1712 Unilateral primary osteoarthritis, left knee: Secondary | ICD-10-CM | POA: Diagnosis not present

## 2023-01-25 DIAGNOSIS — M25562 Pain in left knee: Secondary | ICD-10-CM | POA: Diagnosis not present

## 2023-02-01 ENCOUNTER — Other Ambulatory Visit (HOSPITAL_COMMUNITY)
Admission: RE | Admit: 2023-02-01 | Discharge: 2023-02-01 | Disposition: A | Discharge status: Home / Self Care | Payer: Federal, State, Local not specified - PPO | Source: Ambulatory Visit | Attending: Obstetrics and Gynecology | Admitting: Obstetrics and Gynecology

## 2023-02-01 ENCOUNTER — Encounter: Payer: Self-pay | Admitting: Obstetrics and Gynecology

## 2023-02-01 ENCOUNTER — Ambulatory Visit (INDEPENDENT_AMBULATORY_CARE_PROVIDER_SITE_OTHER): Payer: Federal, State, Local not specified - PPO | Admitting: Obstetrics and Gynecology

## 2023-02-01 VITALS — BP 192/117 | HR 61 | Ht 65.0 in | Wt 168.4 lb

## 2023-02-01 DIAGNOSIS — Z01419 Encounter for gynecological examination (general) (routine) without abnormal findings: Secondary | ICD-10-CM

## 2023-02-01 NOTE — Progress Notes (Signed)
Patient presents for Annual.  LMP: Postmenopausal  Last pap: Date: 2023  Contraception: Post-menopausal Mammogram: Up to date: 01/13/2023 STD Screening: Declines Flu Vaccine : Declines  CC: Annual

## 2023-02-01 NOTE — Progress Notes (Signed)
Subjective:     [name] is a 62 y.o. female P2 postmenopausal with BMI 28 who is here for a comprehensive physical exam. The patient reports no problems. Patient denies any postmenopausal vaginal bleeding. She is sexually active and admits to some occasional vaginal dryness. She denies pelvic pain or abnormal discharge. She denies any urinary incontinence or abnormal bowel movements  Past Medical History:  Diagnosis Date   Abnormal finding on Pap smear 2006   endometrial cells, biopsy ok   Anxiety    Fibroids    uterine   Herpes zoster    mild case   History of pelvic ultrasound 10/2007   uterine fibroids   Hyperlipidemia    Hypertension    with some white coat component   Past Surgical History:  Procedure Laterality Date   COLONOSCOPY     CRYOABLATION  1982   abnormal pap   INTRAUTERINE DEVICE INSERTION  2006   MENISCUS REPAIR Right 2002, 03/07/2015   WISDOM TOOTH EXTRACTION     Family History  Problem Relation Age of Onset   Heart disease Mother    Heart attack Mother        stent   Hyperlipidemia Mother    Colon cancer Mother 62       deceased 04/08/14   Hypertension Mother    Stroke Mother    Glaucoma Father    Goiter Daughter    Colon polyps Neg Hx    Esophageal cancer Neg Hx    Stomach cancer Neg Hx    Rectal cancer Neg Hx    Social History   Socioeconomic History   Marital status: Single    Spouse name: Not on file   Number of children: 2   Years of education: Not on file   Highest education level: Not on file  Occupational History   Occupation: Agricultural engineer: [employer]  Tobacco Use   Smoking status: Never   Smokeless tobacco: Never  Vaping Use   Vaping Use: Never used  Substance and Sexual Activity   Alcohol use: Yes    Comment: occ   Drug use: No   Sexual activity: Yes    Partners: Male  Other Topics Concern   Not on file  Social History Narrative   Exercises 4 x a week, cardio and strength training   Social Determinants of Health    Financial Resource Strain: Not on file  Food Insecurity: Not on file  Transportation Needs: Not on file  Physical Activity: Not on file  Stress: Not on file  Social Connections: Not on file  Intimate Partner Violence: Not on file   Health Maintenance  Topic Date Due   HIV Screening  Never done   Hepatitis C Screening  Never done   Zoster Vaccines- Shingrix (1 of 2) Never done   COVID-19 Vaccine (5 - 2023-24 season) 06/11/2022   DTaP/Tdap/Td (3 - Td or Tdap) 10/10/2022   INFLUENZA VACCINE  05/12/2023   MAMMOGRAM  01/13/2024   PAP SMEAR-Modifier  11/12/2024   COLONOSCOPY (Pts 45-65yrs Insurance coverage will need to be confirmed)  04/23/2027   HPV VACCINES  Aged Out       Review of Systems Pertinent items noted in HPI and remainder of comprehensive ROS otherwise negative.   Objective:  Blood pressure (!) 176/105, pulse 61, height  (1.651 m), weight 168 lb 6.4 oz (76.4 kg), last menstrual period 01/25/2012.   GENERAL: Well-developed, well-nourished female in no acute distress.  HEENT:  Normocephalic, atraumatic. Sclerae anicteric.  NECK: Supple. Normal thyroid.  LUNGS: Clear to auscultation bilaterally.  HEART: Regular rate and rhythm. BREASTS: Symmetric in size. No palpable masses or lymphadenopathy, skin changes, or nipple drainage. ABDOMEN: Soft, nontender, nondistended. No organomegaly. PELVIC: Normal external female genitalia. Vagina is pink and rugated.  Normal discharge. Normal appearing cervix. Uterus is normal in size. No adnexal mass or tenderness. Chaperone present during the pelvic exam EXTREMITIES: No cyanosis, clubbing, or edema, 2+ distal pulses.     Assessment:    Healthy female exam.      Plan:    Pap smear collected Patient current on mammogram and colonoscopy Patient will be contacted with abnormal results Patient plans health maintenance labs with PCP next month Encouraged the use of KY jelly to assist with vaginal dryness which patient has  not tried yet Elevated BP in the office today which patient attributes to white coat syndrome. Repeat BP 192/117- advised patient to go to emergency department for treatment See After Visit Summary for Counseling Recommendations

## 2023-02-02 DIAGNOSIS — M2011 Hallux valgus (acquired), right foot: Secondary | ICD-10-CM | POA: Diagnosis not present

## 2023-02-02 DIAGNOSIS — M2012 Hallux valgus (acquired), left foot: Secondary | ICD-10-CM | POA: Diagnosis not present

## 2023-02-08 LAB — CYTOLOGY - PAP
Comment: NEGATIVE
Diagnosis: NEGATIVE
Diagnosis: REACTIVE
High risk HPV: NEGATIVE

## 2023-02-13 ENCOUNTER — Telehealth: Payer: Self-pay | Admitting: Family Medicine

## 2023-02-13 DIAGNOSIS — E7800 Pure hypercholesterolemia, unspecified: Secondary | ICD-10-CM

## 2023-02-13 DIAGNOSIS — I10 Essential (primary) hypertension: Secondary | ICD-10-CM

## 2023-02-13 NOTE — Telephone Encounter (Signed)
-----   Message from Alvina Chou sent at 02/09/2023  2:31 PM EDT ----- Regarding: Lab orders for Monday, 5.6.24 Patient is scheduled for CPX labs, please order future labs, Thanks , Camelia Eng

## 2023-02-14 ENCOUNTER — Other Ambulatory Visit (INDEPENDENT_AMBULATORY_CARE_PROVIDER_SITE_OTHER): Payer: Federal, State, Local not specified - PPO

## 2023-02-14 DIAGNOSIS — E7800 Pure hypercholesterolemia, unspecified: Secondary | ICD-10-CM | POA: Diagnosis not present

## 2023-02-14 DIAGNOSIS — I10 Essential (primary) hypertension: Secondary | ICD-10-CM

## 2023-02-14 LAB — CBC WITH DIFFERENTIAL/PLATELET
Basophils Absolute: 0 10*3/uL (ref 0.0–0.1)
Basophils Relative: 0.5 % (ref 0.0–3.0)
Eosinophils Absolute: 0.1 10*3/uL (ref 0.0–0.7)
Eosinophils Relative: 1.5 % (ref 0.0–5.0)
HCT: 40.7 % (ref 36.0–46.0)
Hemoglobin: 13.7 g/dL (ref 12.0–15.0)
Lymphocytes Relative: 29.5 % (ref 12.0–46.0)
Lymphs Abs: 1.8 10*3/uL (ref 0.7–4.0)
MCHC: 33.6 g/dL (ref 30.0–36.0)
MCV: 97.6 fl (ref 78.0–100.0)
Monocytes Absolute: 0.4 10*3/uL (ref 0.1–1.0)
Monocytes Relative: 7 % (ref 3.0–12.0)
Neutro Abs: 3.8 10*3/uL (ref 1.4–7.7)
Neutrophils Relative %: 61.5 % (ref 43.0–77.0)
Platelets: 232 10*3/uL (ref 150.0–400.0)
RBC: 4.17 Mil/uL (ref 3.87–5.11)
RDW: 12.6 % (ref 11.5–15.5)
WBC: 6.1 10*3/uL (ref 4.0–10.5)

## 2023-02-14 LAB — LIPID PANEL
Cholesterol: 231 mg/dL — ABNORMAL HIGH (ref 0–200)
HDL: 76.5 mg/dL (ref >39.00)
LDL Cholesterol: 134 mg/dL — ABNORMAL HIGH (ref 0–99)
NonHDL: 154.46
Total CHOL/HDL Ratio: 3
Triglycerides: 102 mg/dL (ref 0.0–149.0)
VLDL: 20.4 mg/dL (ref 0.0–40.0)

## 2023-02-14 LAB — COMPREHENSIVE METABOLIC PANEL
ALT: 8 U/L (ref 0–35)
AST: 20 U/L (ref 0–37)
Albumin: 4 g/dL (ref 3.5–5.2)
Alkaline Phosphatase: 86 U/L (ref 39–117)
BUN: 8 mg/dL (ref 6–23)
CO2: 30 mEq/L (ref 19–32)
Calcium: 9.5 mg/dL (ref 8.4–10.5)
Chloride: 98 mEq/L (ref 96–112)
Creatinine, Ser: 1.01 mg/dL (ref 0.40–1.20)
GFR: 58.9 mL/min — ABNORMAL LOW (ref >60.00)
Glucose, Bld: 90 mg/dL (ref 70–99)
Potassium: 3.7 mEq/L (ref 3.5–5.1)
Sodium: 137 mEq/L (ref 135–145)
Total Bilirubin: 0.9 mg/dL (ref 0.2–1.2)
Total Protein: 7.4 g/dL (ref 6.0–8.3)

## 2023-02-14 LAB — TSH: TSH: 2.16 u[IU]/mL (ref 0.35–5.50)

## 2023-02-17 ENCOUNTER — Encounter: Payer: Self-pay | Admitting: Family Medicine

## 2023-02-17 ENCOUNTER — Ambulatory Visit (INDEPENDENT_AMBULATORY_CARE_PROVIDER_SITE_OTHER): Payer: Federal, State, Local not specified - PPO | Admitting: Family Medicine

## 2023-02-17 VITALS — BP 129/80 | HR 67 | Temp 97.3°F | Ht 64.25 in | Wt 166.0 lb

## 2023-02-17 DIAGNOSIS — I10 Essential (primary) hypertension: Secondary | ICD-10-CM

## 2023-02-17 DIAGNOSIS — E7800 Pure hypercholesterolemia, unspecified: Secondary | ICD-10-CM

## 2023-02-17 DIAGNOSIS — Z23 Encounter for immunization: Secondary | ICD-10-CM

## 2023-02-17 DIAGNOSIS — M170 Bilateral primary osteoarthritis of knee: Secondary | ICD-10-CM

## 2023-02-17 DIAGNOSIS — E663 Overweight: Secondary | ICD-10-CM

## 2023-02-17 DIAGNOSIS — Z0000 Encounter for general adult medical examination without abnormal findings: Secondary | ICD-10-CM

## 2023-02-17 DIAGNOSIS — Z800 Family history of malignant neoplasm of digestive organs: Secondary | ICD-10-CM

## 2023-02-17 MED ORDER — HYDROCHLOROTHIAZIDE 12.5 MG PO CAPS: ORAL_CAPSULE | ORAL | 3 refills | Status: DC

## 2023-02-17 NOTE — Assessment & Plan Note (Signed)
Disc goals for lipids and reasons to control them Rev last labs with pt Rev low sat fat diet in detail LDL up to 134 Pt plans to cut back on bkfast meat/fast food Re check 3 mo

## 2023-02-17 NOTE — Assessment & Plan Note (Signed)
Reviewed health habits including diet and exercise and skin cancer prevention Reviewed appropriate screening tests for age  Also reviewed health mt list, fam hx and immunization status , as well as social and family history   See HPI Labs reviewed and ordered Declines shingrix vaccine Td updated today  Mammogram utd 01/2023 Colonoscopy utd 04/2022 with 5 y recall/ family hx Utd pap 01/2023 and gyn care PHQ of 0 Encouraged ca and D and strength training for bone health- given handout

## 2023-02-17 NOTE — Assessment & Plan Note (Signed)
bp in fair control at this time (better at home as well/ our readings seem to correlate with her machine on 2nd check)  BP Readings from Last 1 Encounters:  02/17/23 129/80   No changes needed Most recent labs reviewed  Disc lifstyle change with low sodium diet and exercise  Plan to continue hctz 12.5 mg daily  Home bp run in 120s/80s usually

## 2023-02-17 NOTE — Progress Notes (Signed)
Subjective:    Patient ID: [name], female    DOB: 09/29/1961, 62 y.o.   MRN: [id number]  HPI Here for health maintenance exam and to review chronic medical problems    Wt Readings from Last 3 Encounters:  02/17/23 166 lb (75.3 kg)  02/01/23 168 lb 6.4 oz (76.4 kg)  09/17/22 169 lb 12.8 oz (77 kg)   28.27 kg/m  Vitals:   02/17/23 0820 02/17/23 0827  BP: (!) 158/90 (!) 154/90  Pulse: 67   Temp: (!) 97.3 F (36.3 C)   SpO2: 97%    Working  Doing ok overall    Some knee pain this winter  Has osteo arthritis     Immunization History  Administered Date(s) Administered   Influenza Split 10/10/2012   Influenza,inj,Quad PF,6+ Mos 10/17/2013, 11/15/2016, 11/28/2017, 11/30/2018, 07/13/2019   Influenza-Unspecified 07/12/2014   PFIZER Comirnaty(Gray Top)Covid-19 Tri-Sucrose Vaccine 03/17/2021   PFIZER(Purple Top)SARS-COV-2 Vaccination 12/21/2019, 01/15/2020, 08/29/2020   Td 10/11/2001   Tdap 10/10/2012   Health Maintenance Due  Topic Date Due   HIV Screening  Never done   Hepatitis C Screening  Never done   Zoster Vaccines- Shingrix (1 of 2) Never done   COVID-19 Vaccine (5 - 2023-24 season) 06/11/2022   DTaP/Tdap/Td (3 - Td or Tdap) 10/10/2022   Shingrix vaccine- declines  Tetanus vaccine -wants to update today   Mammogram 01/2023  Self breast exam- no lumps   Pap 01/2023  from gyn normal with neg HPV  Colonoscopy 04/2022 with 5 y recall  Mother had colonoscopy at 10    Mood     02/17/2023    8:29 AM 02/01/2023    8:25 AM 09/17/2022    3:06 PM 02/15/2022    8:31 AM 02/09/2021   12:02 PM  Depression screen PHQ 2/9  Decreased Interest 0 0 0 0 2  Down, Depressed, Hopeless 0 0 0 0 2  PHQ - 2 Score 0 0 0 0 4  Altered sleeping  0   2  Tired, decreased energy  0   2  Change in appetite  0   0  Feeling bad or failure about yourself   0   2  Trouble concentrating  0   2  Moving slowly or fidgety/restless  0   0  Suicidal thoughts  0   0  PHQ-9 Score  0   12   Difficult doing work/chores  Not difficult at all   Somewhat difficult    Lifestyle habits  Not taking ca or D   Diet  Not meat eater in general  Likes lentils, chick peas and black beans  Chicken once per week   Fast food twice per month   Has stopped buying sweets -doing better   Does drink coffee with sugar and cream every day   Exercise- bikes /walks/weights     HTN bp is stable today - better on 2nd check  No cp or palpitations or headaches or edema  No side effects to medicines  BP Readings from Last 3 Encounters:  02/17/23 129/80  02/01/23 (!) 192/117  09/17/22 (!) 144/86     Hctz 12.5 mg daily  Her bp is lower at home and out of office  125/89 this am     Last metabolic panel Lab Results  Component Value Date   GLUCOSE 90 02/14/2023   NA 137 02/14/2023   K 3.7 02/14/2023   CL 98 02/14/2023   CO2 30 02/14/2023   BUN  8 02/14/2023   CREATININE 1.01 02/14/2023   CALCIUM 9.5 02/14/2023   PHOS 3.7 02/11/2010   PROT 7.4 02/14/2023   ALBUMIN 4.0 02/14/2023   BILITOT 0.9 02/14/2023   ALKPHOS 86 02/14/2023   AST 20 02/14/2023   ALT 8 02/14/2023  GFR 58.9   Hyperlipidemia  Lab Results  Component Value Date   CHOL 231 (H) 02/14/2023   CHOL 205 (H) 08/02/2022   CHOL 232 (H) 02/09/2022   Lab Results  Component Value Date   HDL 76.50 02/14/2023   HDL 76.20 08/02/2022   HDL 80.90 02/09/2022   Lab Results  Component Value Date   LDLCALC 134 (H) 02/14/2023   LDLCALC 111 (H) 08/02/2022   LDLCALC 130 (H) 02/09/2022   Lab Results  Component Value Date   TRIG 102.0 02/14/2023   TRIG 87.0 08/02/2022   TRIG 105.0 02/09/2022   Lab Results  Component Value Date   CHOLHDL 3 02/14/2023   CHOLHDL 3 08/02/2022   CHOLHDL 3 02/09/2022   Lab Results  Component Value Date   LDLDIRECT 128.6 10/17/2013   LDLDIRECT 128.8 10/02/2012   LDLDIRECT 129.8 02/11/2011   Diet controlled  Working on that  Some breakfast meat  The 10-year ASCVD risk score  (Arnett DK, et al., 2019) is: 9.1%   Values used to calculate the score:     Age: 46 years     Sex: Female     Is Non-Hispanic African American: Yes     Diabetic: No     Tobacco smoker: No     Systolic Blood Pressure: 129 mmHg     Is BP treated: Yes     HDL Cholesterol: 76.5 mg/dL     Total Cholesterol: 231 mg/dL   Other labs  Lab Results  Component Value Date   WBC 6.1 02/14/2023   HGB 13.7 02/14/2023   HCT 40.7 02/14/2023   MCV 97.6 02/14/2023   PLT 232.0 02/14/2023    Lab Results  Component Value Date   TSH 2.16 02/14/2023     Patient Active Problem List   Diagnosis Date Noted   Seborrheic keratoses, inflamed 09/17/2022   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   GERD (gastroesophageal reflux disease) 12/03/2019   Allergic rhinitis 08/02/2019   Rhinorrhea 11/15/2016   Overweight (BMI 25.0-29.9) 11/15/2016   Family history of colon cancer 05/14/2015   Travel advice encounter 01/31/2015   Routine general medical examination at a health care facility 10/01/2012   Essential hypertension, benign 01/29/2010   Hyperlipidemia 10/09/2007   Osteoarthritis of both knees 10/09/2007   Past Medical History:  Diagnosis Date   Abnormal finding on Pap smear 2006   endometrial cells, biopsy ok   Anxiety    Fibroids    uterine   Herpes zoster    mild case   History of pelvic ultrasound 10/2007   uterine fibroids   Hyperlipidemia    Hypertension    with some white coat component   Past Surgical History:  Procedure Laterality Date   COLONOSCOPY     CRYOABLATION  1982   abnormal pap   INTRAUTERINE DEVICE INSERTION  2006   MENISCUS REPAIR Right 2002, 03/07/2015   WISDOM TOOTH EXTRACTION     Social History   Tobacco Use   Smoking status: Never   Smokeless tobacco: Never  Vaping Use   Vaping Use: Never used  Substance Use Topics   Alcohol use: Yes    Comment: occ   Drug use: No   Family History  Problem Relation Age of Onset   Heart disease Mother    Heart attack  Mother        stent   Hyperlipidemia Mother    Colon cancer Mother 9       deceased 2014/03/06   Hypertension Mother    Stroke Mother    Glaucoma Father    Goiter Daughter    Colon polyps Neg Hx    Esophageal cancer Neg Hx    Stomach cancer Neg Hx    Rectal cancer Neg Hx    No Known Allergies Current Outpatient Medications on File Prior to Visit  Medication Sig Dispense Refill   HAWTHORNE BERRY PO Take by mouth.     Multiple Vitamin (MULTIVITAMIN) capsule Take 1 capsule by mouth daily.     Passion Flower-Valerian 500-500 MG CAPS Take by mouth.     No current facility-administered medications on file prior to visit.     Review of Systems  Constitutional:  Negative for activity change, appetite change, fatigue, fever and unexpected weight change.  HENT:  Negative for congestion, ear pain, rhinorrhea, sinus pressure and sore throat.   Eyes:  Negative for pain, redness and visual disturbance.  Respiratory:  Negative for cough, shortness of breath and wheezing.   Cardiovascular:  Negative for chest pain and palpitations.  Gastrointestinal:  Negative for abdominal pain, blood in stool, constipation and diarrhea.  Endocrine: Negative for polydipsia and polyuria.  Genitourinary:  Negative for dysuria, frequency and urgency.  Musculoskeletal:  Positive for arthralgias. Negative for back pain and myalgias.  Skin:  Negative for pallor and rash.  Allergic/Immunologic: Negative for environmental allergies.  Neurological:  Negative for dizziness, syncope and headaches.  Hematological:  Negative for adenopathy. Does not bruise/bleed easily.  Psychiatric/Behavioral:  Negative for decreased concentration and dysphoric mood. The patient is not nervous/anxious.        Objective:   Physical Exam Constitutional:      General: She is not in acute distress.    Appearance: Normal appearance. She is well-developed. She is not ill-appearing or diaphoretic.  HENT:     Head: Normocephalic and  atraumatic.     Right Ear: Tympanic membrane, ear canal and external ear normal.     Left Ear: Tympanic membrane, ear canal and external ear normal.     Nose: Nose normal. No congestion.     Mouth/Throat:     Mouth: Mucous membranes are moist.     Pharynx: Oropharynx is clear. No posterior oropharyngeal erythema.  Eyes:     General: No scleral icterus.    Extraocular Movements: Extraocular movements intact.     Conjunctiva/sclera: Conjunctivae normal.     Pupils: Pupils are equal, round, and reactive to light.  Neck:     Thyroid: No thyromegaly.     Vascular: No carotid bruit or JVD.  Cardiovascular:     Rate and Rhythm: Normal rate and regular rhythm.     Pulses: Normal pulses.     Heart sounds: Normal heart sounds.     No gallop.  Pulmonary:     Effort: Pulmonary effort is normal. No respiratory distress.     Breath sounds: Normal breath sounds. No wheezing.     Comments: Good air exch Chest:     Chest wall: No tenderness.  Abdominal:     General: Bowel sounds are normal. There is no distension or abdominal bruit.     Palpations: Abdomen is soft. There is no mass.     Tenderness: There is  no abdominal tenderness.     Hernia: No hernia is present.  Genitourinary:    Comments: Breast and pelvic exam are done by gyn provider  Musculoskeletal:        General: No tenderness. Normal range of motion.     Cervical back: Normal range of motion and neck supple. No rigidity. No muscular tenderness.     Right lower leg: No edema.     Left lower leg: No edema.     Comments: No kyphosis   Lymphadenopathy:     Cervical: No cervical adenopathy.  Skin:    General: Skin is warm and dry.     Coloration: Skin is not pale.     Findings: No erythema or rash.  Neurological:     Mental Status: She is alert. Mental status is at baseline.     Cranial Nerves: No cranial nerve deficit.     Motor: No abnormal muscle tone.     Coordination: Coordination normal.     Gait: Gait normal.     Deep  Tendon Reflexes: Reflexes are normal and symmetric.  Psychiatric:        Mood and Affect: Mood normal.        Cognition and Memory: Cognition and memory normal.           Assessment & Plan:   Problem List Items Addressed This Visit       Cardiovascular and Mediastinum   Essential hypertension, benign    bp in fair control at this time (better at home as well/ our readings seem to correlate with her machine on 2nd check)  BP Readings from Last 1 Encounters:  02/17/23 129/80  No changes needed Most recent labs reviewed  Disc lifstyle change with low sodium diet and exercise  Plan to continue hctz 12.5 mg daily  Home bp run in 120s/80s usually      Relevant Medications   hydrochlorothiazide (MICROZIDE) 12.5 MG capsule     Musculoskeletal and Integument   Osteoarthritis of both knees    Has seen ortho  Did get new orthotics Doing lower impact exercise  PT may help in future        Other   Family history of colon cancer    Mother in 31s Pt had colonoscopy 04/2022 with 5 y recall      Hyperlipidemia    Disc goals for lipids and reasons to control them Rev last labs with pt Rev low sat fat diet in detail LDL up to 134 Pt plans to cut back on bkfast meat/fast food Re check 3 mo        Relevant Medications   hydrochlorothiazide (MICROZIDE) 12.5 MG capsule   Overweight (BMI 25.0-29.9)    Encouraged more strength training to help resting metabolic rate      Routine general medical examination at a health care facility - Primary    Reviewed health habits including diet and exercise and skin cancer prevention Reviewed appropriate screening tests for age  Also reviewed health mt list, fam hx and immunization status , as well as social and family history   See HPI Labs reviewed and ordered Declines shingrix vaccine Td updated today  Mammogram utd 01/2023 Colonoscopy utd 04/2022 with 5 y recall/ family hx Utd pap 01/2023 and gyn care PHQ of 0 Encouraged ca and D  and strength training for bone health- given handout

## 2023-02-17 NOTE — Assessment & Plan Note (Signed)
Encouraged more strength training to help resting metabolic rate

## 2023-02-17 NOTE — Patient Instructions (Addendum)
For cholesterol Avoid red meat/ fried foods/ egg yolks/ fatty breakfast meats/ butter, cheese and high fat dairy/ and shellfish  Avoid fast food if you can   Let's re check cholesterol in 3 months    For bone health  Try to get 1200-1500 mg of calcium per day with at least 2000 iu of vitamin D - for bone health If calcium constipates you then just take vitamin D  Keep doing cardio More importantly- strength training Add some strength training to your routine, this is important for bone and brain health and can reduce your risk of falls and help your body use insulin properly and regulate weight  Light weights, exercise bands , and internet videos are a good way to start  Yoga (chair or regular), machines , floor exercises or a gym with machines are also good options   Tetanus shot today

## 2023-02-17 NOTE — Assessment & Plan Note (Signed)
Mother in 79s Pt had colonoscopy 04/2022 with 5 y recall

## 2023-02-17 NOTE — Assessment & Plan Note (Signed)
Has seen ortho  Did get new orthotics Doing lower impact exercise  PT may help in future

## 2023-05-17 ENCOUNTER — Telehealth: Payer: Self-pay | Admitting: Family Medicine

## 2023-05-17 DIAGNOSIS — E7800 Pure hypercholesterolemia, unspecified: Secondary | ICD-10-CM

## 2023-05-17 NOTE — Telephone Encounter (Signed)
-----   Message from Lovena Neighbours sent at 05/03/2023  4:44 PM EDT ----- Regarding: Labs for 8.9.24 Pt is on lab schedule for 8.9.24, please put orders in future. Thank you, Denny Peon

## 2023-05-18 DIAGNOSIS — H40013 Open angle with borderline findings, low risk, bilateral: Secondary | ICD-10-CM | POA: Diagnosis not present

## 2023-05-20 ENCOUNTER — Other Ambulatory Visit (INDEPENDENT_AMBULATORY_CARE_PROVIDER_SITE_OTHER): Payer: Federal, State, Local not specified - PPO

## 2023-05-20 DIAGNOSIS — E7800 Pure hypercholesterolemia, unspecified: Secondary | ICD-10-CM

## 2023-05-20 LAB — LIPID PANEL
Cholesterol: 199 mg/dL (ref 0–200)
HDL: 67.7 mg/dL (ref >39.00)
LDL Cholesterol: 111 mg/dL — ABNORMAL HIGH (ref 0–99)
NonHDL: 131.34
Total CHOL/HDL Ratio: 3
Triglycerides: 101 mg/dL (ref 0.0–149.0)
VLDL: 20.2 mg/dL (ref 0.0–40.0)

## 2023-09-18 DIAGNOSIS — W260XXA Contact with knife, initial encounter: Secondary | ICD-10-CM | POA: Diagnosis not present

## 2023-09-18 DIAGNOSIS — S61215A Laceration without foreign body of left ring finger without damage to nail, initial encounter: Secondary | ICD-10-CM | POA: Diagnosis not present

## 2023-09-23 DIAGNOSIS — M79674 Pain in right toe(s): Secondary | ICD-10-CM | POA: Diagnosis not present

## 2023-12-07 ENCOUNTER — Other Ambulatory Visit: Payer: Self-pay | Admitting: Family Medicine

## 2023-12-07 DIAGNOSIS — Z1231 Encounter for screening mammogram for malignant neoplasm of breast: Secondary | ICD-10-CM

## 2023-12-30 ENCOUNTER — Telehealth: Payer: Self-pay | Admitting: *Deleted

## 2023-12-30 NOTE — Telephone Encounter (Signed)
 Please schedule lab prior to CPE, PCP will put orders in

## 2023-12-30 NOTE — Telephone Encounter (Signed)
 Copied from CRM [id number]. Topic: General - Other >> Dec 30, 2023 10:48 AM Aletta Edouard wrote: Reason for CRM: patient is requesting labs before she has her physical

## 2024-01-16 ENCOUNTER — Ambulatory Visit
Admission: RE | Admit: 2024-01-16 | Discharge: 2024-01-16 | Disposition: A | Discharge status: Home / Self Care | Payer: Federal, State, Local not specified - PPO | Source: Ambulatory Visit | Attending: Family Medicine | Admitting: Family Medicine

## 2024-01-16 DIAGNOSIS — Z1231 Encounter for screening mammogram for malignant neoplasm of breast: Secondary | ICD-10-CM

## 2024-01-18 ENCOUNTER — Encounter: Payer: Self-pay | Admitting: Family Medicine

## 2024-02-12 ENCOUNTER — Telehealth: Payer: Self-pay | Admitting: Family Medicine

## 2024-02-12 DIAGNOSIS — I10 Essential (primary) hypertension: Secondary | ICD-10-CM

## 2024-02-12 DIAGNOSIS — E7800 Pure hypercholesterolemia, unspecified: Secondary | ICD-10-CM

## 2024-02-12 NOTE — Telephone Encounter (Signed)
-----   Message from Gerry Krone sent at 01/31/2024 10:13 AM EDT ----- Regarding: Lab orders for Mon, 5.6.25 Patient is scheduled for CPX labs, please order future labs, Thanks , [name]

## 2024-02-13 ENCOUNTER — Encounter: Payer: Self-pay | Admitting: Obstetrics and Gynecology

## 2024-02-13 ENCOUNTER — Telehealth: Payer: Self-pay | Admitting: Family Medicine

## 2024-02-13 ENCOUNTER — Ambulatory Visit: Admitting: Obstetrics and Gynecology

## 2024-02-13 ENCOUNTER — Other Ambulatory Visit (HOSPITAL_COMMUNITY)
Admission: RE | Admit: 2024-02-13 | Discharge: 2024-02-13 | Disposition: A | Discharge status: Home / Self Care | Source: Ambulatory Visit | Attending: Obstetrics and Gynecology | Admitting: Obstetrics and Gynecology

## 2024-02-13 VITALS — BP 169/108 | HR 73 | Ht 64.0 in | Wt 173.0 lb

## 2024-02-13 DIAGNOSIS — Z1151 Encounter for screening for human papillomavirus (HPV): Secondary | ICD-10-CM | POA: Diagnosis not present

## 2024-02-13 DIAGNOSIS — Z124 Encounter for screening for malignant neoplasm of cervix: Secondary | ICD-10-CM | POA: Diagnosis not present

## 2024-02-13 DIAGNOSIS — Z1339 Encounter for screening examination for other mental health and behavioral disorders: Secondary | ICD-10-CM

## 2024-02-13 DIAGNOSIS — Z01419 Encounter for gynecological examination (general) (routine) without abnormal findings: Secondary | ICD-10-CM | POA: Diagnosis present

## 2024-02-13 NOTE — Telephone Encounter (Signed)
-----   Message from Gerry Krone sent at 01/26/2024  3:19 PM EDT ----- Regarding: Lab orders for Wed, 5.7.25 Patient is scheduled for CPX labs, please order future labs, Thanks , [name]

## 2024-02-13 NOTE — Progress Notes (Signed)
 Subjective:     [name] is a 63 y.o. female P2 postmenopausal with BMI 29 who is here for a comprehensive physical exam. The patient reports continued vaginal dryness and admits that she has not been consistent with [place] vaginal lubricant. She is without any other complaints. She denies postmenopausal vaginal bleeding. She denies urinary incontinence or issues with bowel movements.  Past Medical History:  Diagnosis Date   Abnormal finding on Pap smear 03-Mar-2005   endometrial cells, biopsy ok   Anxiety    Fibroids    uterine   Herpes zoster    mild case   History of pelvic ultrasound 10/2007   uterine fibroids   Hyperlipidemia    Hypertension    with some white coat component   Past Surgical History:  Procedure Laterality Date   COLONOSCOPY     CRYOABLATION  03-Mar-1981   abnormal pap   INTRAUTERINE DEVICE INSERTION  2005/03/03   MENISCUS REPAIR Right 03-03-01, 03/07/2015   WISDOM TOOTH EXTRACTION     Family History  Problem Relation Age of Onset   Heart disease Mother    Heart attack Mother        stent   Hyperlipidemia Mother    Colon cancer Mother 50       deceased 03-03-14   Hypertension Mother    Stroke Mother    Glaucoma Father    Goiter Daughter    Colon polyps Neg Hx    Esophageal cancer Neg Hx    Stomach cancer Neg Hx    Rectal cancer Neg Hx    Social History   Socioeconomic History   Marital status: Single    Spouse name: Not on file   Number of children: 2   Years of education: Not on file   Highest education level: Not on file  Occupational History   Occupation: Agricultural engineer: [employer]  Tobacco Use   Smoking status: Never   Smokeless tobacco: Never  Vaping Use   Vaping status: Never Used  Substance and Sexual Activity   Alcohol use: Yes    Comment: occ   Drug use: No   Sexual activity: Yes    Partners: Male  Other Topics Concern   Not on file  Social History Narrative   Exercises 4 x a week, cardio and strength training   Social Drivers of Health    Financial Resource Strain: Not on file  Food Insecurity: Not on file  Transportation Needs: Not on file  Physical Activity: Not on file  Stress: Not on file  Social Connections: Not on file  Intimate Partner Violence: Not on file   Health Maintenance  Topic Date Due   HIV Screening  Never done   Hepatitis C Screening  Never done   Zoster Vaccines- Shingrix (1 of 2) Never done   Pneumonia Vaccine 60+ Years old (1 of 1 - PCV) Never done   COVID-19 Vaccine (6 - 2024-25 season) 06/12/2023   DEXA SCAN  Never done   INFLUENZA VACCINE  05/11/2024   MAMMOGRAM  01/15/2025   Colonoscopy  04/23/2027   Cervical Cancer Screening (HPV/Pap Cotest)  02/01/2028   DTaP/Tdap/Td (4 - Td or Tdap) 02/16/2033   HPV VACCINES  Aged Out   Meningococcal B Vaccine  Aged Out       Review of Systems Pertinent items noted in HPI and remainder of comprehensive ROS otherwise negative.   Objective:  Blood pressure (!) 169/103, pulse 71, height 5\' 4"  (1.626 m),  weight 173 lb (78.5 kg), last menstrual period 01/25/2012.   GENERAL: Well-developed, well-nourished female in no acute distress.  HEENT: Normocephalic, atraumatic. Sclerae anicteric.  NECK: Supple. Normal thyroid .  LUNGS: Clear to auscultation bilaterally.  HEART: Regular rate and rhythm. BREASTS: Symmetric in size. No palpable masses or lymphadenopathy, skin changes, or nipple drainage. ABDOMEN: Soft, nontender, nondistended. No organomegaly. PELVIC: Normal external female genitalia. Vagina is pink and rugated.  Normal discharge. Normal appearing cervix. Uterus is normal in size. No adnexal mass or tenderness. Chaperone present during the pelvic exam EXTREMITIES: No cyanosis, clubbing, or edema, 2+ distal pulses.     Assessment:    Healthy female exam.      Plan:    Pap smear collected Patient current on mammogram and colonoscopy Patient scheduled with PCP next week for labs and plans to have DEXA scan ordered by PCP Discussed again  elevated BP today which has been steadily increasing since 2023. Patient plans to discuss with PCP Patient will be contacted with abnormal results See After Visit Summary for Counseling Recommendations

## 2024-02-13 NOTE — Progress Notes (Signed)
 63 y.o. GYN presents for AEX.  Reports no concerns today.

## 2024-02-13 NOTE — Addendum Note (Signed)
 Addended by: Karelyn Brisby J on: 02/13/2024 10:06 AM   Modules accepted: Orders

## 2024-02-14 ENCOUNTER — Other Ambulatory Visit (INDEPENDENT_AMBULATORY_CARE_PROVIDER_SITE_OTHER)

## 2024-02-14 DIAGNOSIS — I10 Essential (primary) hypertension: Secondary | ICD-10-CM

## 2024-02-14 DIAGNOSIS — E7800 Pure hypercholesterolemia, unspecified: Secondary | ICD-10-CM | POA: Diagnosis not present

## 2024-02-14 LAB — LIPID PANEL
Cholesterol: 235 mg/dL — ABNORMAL HIGH (ref 0–200)
HDL: 71 mg/dL (ref >39.00)
LDL Cholesterol: 138 mg/dL — ABNORMAL HIGH (ref 0–99)
NonHDL: 164.47
Total CHOL/HDL Ratio: 3
Triglycerides: 132 mg/dL (ref 0.0–149.0)
VLDL: 26.4 mg/dL (ref 0.0–40.0)

## 2024-02-14 LAB — COMPREHENSIVE METABOLIC PANEL WITH GFR
ALT: 6 U/L (ref 0–35)
AST: 20 U/L (ref 0–37)
Albumin: 4.1 g/dL (ref 3.5–5.2)
Alkaline Phosphatase: 90 U/L (ref 39–117)
BUN: 12 mg/dL (ref 6–23)
CO2: 31 meq/L (ref 19–32)
Calcium: 9.6 mg/dL (ref 8.4–10.5)
Chloride: 100 meq/L (ref 96–112)
Creatinine, Ser: 1.05 mg/dL (ref 0.40–1.20)
GFR: 55.83 mL/min — ABNORMAL LOW (ref >60.00)
Glucose, Bld: 92 mg/dL (ref 70–99)
Potassium: 3.8 meq/L (ref 3.5–5.1)
Sodium: 138 meq/L (ref 135–145)
Total Bilirubin: 1 mg/dL (ref 0.2–1.2)
Total Protein: 7.5 g/dL (ref 6.0–8.3)

## 2024-02-14 LAB — CBC WITH DIFFERENTIAL/PLATELET
Basophils Absolute: 0 10*3/uL (ref 0.0–0.1)
Basophils Relative: 0.7 % (ref 0.0–3.0)
Eosinophils Absolute: 0.1 10*3/uL (ref 0.0–0.7)
Eosinophils Relative: 2.2 % (ref 0.0–5.0)
HCT: 40.9 % (ref 36.0–46.0)
Hemoglobin: 13.6 g/dL (ref 12.0–15.0)
Lymphocytes Relative: 26.5 % (ref 12.0–46.0)
Lymphs Abs: 1.8 10*3/uL (ref 0.7–4.0)
MCHC: 33.2 g/dL (ref 30.0–36.0)
MCV: 98.7 fl (ref 78.0–100.0)
Monocytes Absolute: 0.5 10*3/uL (ref 0.1–1.0)
Monocytes Relative: 8.1 % (ref 3.0–12.0)
Neutro Abs: 4.2 10*3/uL (ref 1.4–7.7)
Neutrophils Relative %: 62.5 % (ref 43.0–77.0)
Platelets: 243 10*3/uL (ref 150.0–400.0)
RBC: 4.15 Mil/uL (ref 3.87–5.11)
RDW: 12.4 % (ref 11.5–15.5)
WBC: 6.7 10*3/uL (ref 4.0–10.5)

## 2024-02-14 LAB — TSH: TSH: 2.36 u[IU]/mL (ref 0.35–5.50)

## 2024-02-15 ENCOUNTER — Encounter (HOSPITAL_COMMUNITY): Payer: Self-pay

## 2024-02-15 ENCOUNTER — Other Ambulatory Visit

## 2024-02-17 ENCOUNTER — Encounter: Payer: Self-pay | Admitting: Obstetrics & Gynecology

## 2024-02-17 LAB — CYTOLOGY - PAP
Comment: NEGATIVE
Diagnosis: NEGATIVE
High risk HPV: NEGATIVE

## 2024-02-22 ENCOUNTER — Encounter: Payer: Self-pay | Admitting: Family Medicine

## 2024-02-22 ENCOUNTER — Ambulatory Visit (INDEPENDENT_AMBULATORY_CARE_PROVIDER_SITE_OTHER): Admitting: Family Medicine

## 2024-02-22 VITALS — BP 161/90 | HR 60 | Temp 97.8°F | Ht 64.0 in | Wt 170.0 lb

## 2024-02-22 DIAGNOSIS — I10 Essential (primary) hypertension: Secondary | ICD-10-CM

## 2024-02-22 DIAGNOSIS — Z0000 Encounter for general adult medical examination without abnormal findings: Secondary | ICD-10-CM

## 2024-02-22 DIAGNOSIS — E7800 Pure hypercholesterolemia, unspecified: Secondary | ICD-10-CM | POA: Diagnosis not present

## 2024-02-22 DIAGNOSIS — F418 Other specified anxiety disorders: Secondary | ICD-10-CM

## 2024-02-22 DIAGNOSIS — E2839 Other primary ovarian failure: Secondary | ICD-10-CM

## 2024-02-22 DIAGNOSIS — Z800 Family history of malignant neoplasm of digestive organs: Secondary | ICD-10-CM

## 2024-02-22 DIAGNOSIS — E663 Overweight: Secondary | ICD-10-CM

## 2024-02-22 MED ORDER — HYDROCHLOROTHIAZIDE 12.5 MG PO CAPS: ORAL_CAPSULE | ORAL | 3 refills | Status: AC

## 2024-02-22 NOTE — Assessment & Plan Note (Signed)
 Disc goals for lipids and reasons to control them Rev last labs with pt Rev low sat fat diet in detail  LDL up with bad eating habits when she retired Doing better now   Will repeat this in 3 mo

## 2024-02-22 NOTE — Assessment & Plan Note (Addendum)
 Reviewed health habits including diet and exercise and skin cancer prevention Reviewed appropriate screening tests for age  Also reviewed health mt list, fam hx and immunization status , as well as social and family history   See HPI Labs reviewed and ordered Health Maintenance  Topic Date Due   Zoster (Shingles) Vaccine (1 of 2) Never done   Pneumonia Vaccine (1 of 1 - PCV) Never done   DEXA scan (bone density measurement)  Never done   Hepatitis C Screening  02/21/2025*   HIV Screening  02/21/2025*   COVID-19 Vaccine (6 - 2024-25 season) 03/09/2025*   Flu Shot  05/11/2024   Mammogram  01/15/2025   Colon Cancer Screening  04/23/2027   Pap with HPV screening  02/12/2029   DTaP/Tdap/Td vaccine (4 - Td or Tdap) 02/16/2033   HPV Vaccine  Aged Out   Meningitis B Vaccine  Aged Out  *Topic was postponed. The date shown is not the original due date.    Declines Hep C/HIV screen due to low risk status  Considering shingrix vaccine  Will return here in 1 month for prevnar 20 vaccine  Utd gyn care  Discussed fall prevention, supplements and exercise for bone density  Dexa ordered  PHQ0

## 2024-02-22 NOTE — Assessment & Plan Note (Signed)
 Blood pressure is much lower at home but pt uses wrist cuff as well  BP: (!) 161/90   Making good effort for diet/exercise now that she is retired  This may help  Encouraged to get arm cuff if affordable Follow up 1 month with cuff and readings to calibrate it  If still up will add treatment  Continue hydrochlorothiazide  12.5 mg daily  Labs reviewed

## 2024-02-22 NOTE — Assessment & Plan Note (Signed)
 Gets colonoscopy every 5 years Due in 2028

## 2024-02-22 NOTE — Assessment & Plan Note (Signed)
 Discussed how this problem influences overall health and the risks it imposes  Reviewed plan for weight loss with lower calorie diet (via better food choices (lower glycemic and portion control) along with exercise building up to or more than 30 minutes 5 days per week including some aerobic activity and strength training   Able to exercise and eat better now with retirement

## 2024-02-22 NOTE — Assessment & Plan Note (Signed)
 Was anxious at work before retirement  Better now  PHQ and GAD 0   Will continue to follow

## 2024-02-22 NOTE — Progress Notes (Signed)
 Subjective:    Patient ID: [name], female    DOB: 07-19-1961, 63 y.o.   MRN: [id number]  HPI  Here for health maintenance exam and to review chronic medical problems   Wt Readings from Last 3 Encounters:  02/22/24 170 lb (77.1 kg)  02/13/24 173 lb (78.5 kg)  02/17/23 166 lb (75.3 kg)   29.18 kg/m  Gained 4 lb from a year ago  Job became stressful / government  Eating and exercise changed   Took a retirement deal and will still get paid until sept 30th  Back on road to self care     Vitals:   02/22/24 0902 02/22/24 0930  BP: (!) 170/90 (!) 161/90  Pulse: 60   Temp: 97.8 F (36.6 C)   SpO2: 98%     Immunization History  Administered Date(s) Administered   Influenza Inj Mdck Quad Pf 07/22/2020   Influenza Split 10/10/2012   Influenza,inj,Quad PF,6+ Mos 10/17/2013, 11/15/2016, 11/28/2017, 11/30/2018, 07/13/2019   Influenza-Unspecified 07/12/2014   PFIZER Comirnaty(Gray Top)Covid-19 Tri-Sucrose Vaccine 03/17/2021   PFIZER(Purple Top)SARS-COV-2 Vaccination 12/21/2019, 01/15/2020, 08/29/2020, 06/09/2021   Td 10/11/2001, 02/17/2023   Tdap 10/10/2012    Health Maintenance Due  Topic Date Due   Pneumonia Vaccine 65+ Years old (1 of 1 - PCV) Never done   DEXA SCAN  Never done   Hep C screen - declines  HIV screen -declines Low risk   Shingrix - may be interested   Pna vaccine - traveling tomorrow / wants to get this when she gets back     Mammogram 01/2024  Self breast exam- no breast lumps   Gyn health Pap 02/2024 - gyn visit Dr Dodie Frees   Colon cancer screening  Colonoscopy 04/2022 with 5 y recall  Mother had colon cancer  Bone health  Dexa -has not had / wants to schedule  Falls-none  Fractures- broke to in November stubbing toe Supplements -vitamin D3 and ca    Exercise  Regular exercise  Resistance training/ gym at least 3 times per week  Enjoys that    Mood    02/22/2024    9:05 AM 02/13/2024    8:22 AM 02/17/2023    8:29 AM 02/01/2023     8:25 AM 09/17/2022    3:06 PM  Depression screen PHQ 2/9  Decreased Interest 0 0 0 0 0  Down, Depressed, Hopeless 0 0 0 0 0  PHQ - 2 Score 0 0 0 0 0  Altered sleeping 0 0  0   Tired, decreased energy 0 0  0   Change in appetite 0 0  0   Feeling bad or failure about yourself  0 0  0   Trouble concentrating 0 0  0   Moving slowly or fidgety/restless 0 0  0   Suicidal thoughts 0 0  0   PHQ-9 Score 0 0  0   Difficult doing work/chores Not difficult at all Not difficult at all  Not difficult at all   History of anxious depression in past  Not on medication      02/22/2024    9:06 AM 02/13/2024    8:23 AM 02/17/2023    8:29 AM 02/01/2023    8:25 AM  GAD 7 : Generalized Anxiety Score  Nervous, Anxious, on Edge 0 0 0 0  Control/stop worrying 0 0 0 0  Worry too much - different things 0 0 0 0  Trouble relaxing 0 0 0 0  Restless 0 0  0 0  Easily annoyed or irritable 0 0 0 0  Afraid - awful might happen 0 0 0 0  Total GAD 7 Score 0 0 0 0  Anxiety Difficulty Not difficult at all Not difficult at all  Not difficult at all    Gets tense at time     HTN bp is stable today  No cp or palpitations or headaches or edema  No side effects to medicines  BP Readings from Last 3 Encounters:  02/22/24 (!) 161/90  02/13/24 (!) 169/108  02/17/23 129/80    Hydrochlorothiazide  12.5 mg daily  Blood pressure was high at her gyn office as well   Has HTN in family  Just went to the gym today   In car today 136/86 10 min before appointment  122/73   Her anxiety goes up in the office   Lab Results  Component Value Date   NA 138 02/14/2024   K 3.8 02/14/2024   CO2 31 02/14/2024   GLUCOSE 92 02/14/2024   BUN 12 02/14/2024   CREATININE 1.05 02/14/2024   CALCIUM 9.6 02/14/2024   GFR 55.83 (L) 02/14/2024   GFRNONAA 75.08 02/11/2010    Hyperlipidemia Lab Results  Component Value Date   CHOL 235 (H) 02/14/2024   CHOL 199 05/20/2023   CHOL 231 (H) 02/14/2023   Lab Results   Component Value Date   HDL 71.00 02/14/2024   HDL 67.70 05/20/2023   HDL 76.50 02/14/2023   Lab Results  Component Value Date   LDLCALC 138 (H) 02/14/2024   LDLCALC 111 (H) 05/20/2023   LDLCALC 134 (H) 02/14/2023   Lab Results  Component Value Date   TRIG 132.0 02/14/2024   TRIG 101.0 05/20/2023   TRIG 102.0 02/14/2023   Lab Results  Component Value Date   CHOLHDL 3 02/14/2024   CHOLHDL 3 05/20/2023   CHOLHDL 3 02/14/2023   Lab Results  Component Value Date   LDLDIRECT 128.6 10/17/2013   LDLDIRECT 128.8 10/02/2012   LDLDIRECT 129.8 02/11/2011   Diet controlled  Diet was not as good when working  Getting back to better eating now  Coffee     Lab Results  Component Value Date   ALT 6 02/14/2024   AST 20 02/14/2024   ALKPHOS 90 02/14/2024   BILITOT 1.0 02/14/2024     Lab Results  Component Value Date   WBC 6.7 02/14/2024   HGB 13.6 02/14/2024   HCT 40.9 02/14/2024   MCV 98.7 02/14/2024   PLT 243.0 02/14/2024   Lab Results  Component Value Date   TSH 2.36 02/14/2024      Patient Active Problem List   Diagnosis Date Noted   Estrogen deficiency 02/22/2024   Seborrheic keratoses, inflamed 09/17/2022   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   GERD (gastroesophageal reflux disease) 12/03/2019   Allergic rhinitis 08/02/2019   Overweight (BMI 25.0-29.9) 11/15/2016   Family history of colon cancer 05/14/2015   Routine general medical examination at a health care facility 10/01/2012   Essential hypertension, benign 01/29/2010   Hyperlipidemia 10/09/2007   Osteoarthritis of both knees 10/09/2007   Past Medical History:  Diagnosis Date   Abnormal finding on Pap smear 2006   endometrial cells, biopsy ok   Anxiety    Fibroids    uterine   Herpes zoster    mild case   History of pelvic ultrasound 10/2007   uterine fibroids   Hyperlipidemia    Hypertension    with some white coat component  Past Surgical History:  Procedure Laterality Date   COLONOSCOPY      CRYOABLATION  1981-03-08   abnormal pap   INTRAUTERINE DEVICE INSERTION  03-08-05   MENISCUS REPAIR Right 2002, 03/07/2015   WISDOM TOOTH EXTRACTION     Social History   Tobacco Use   Smoking status: Never   Smokeless tobacco: Never  Vaping Use   Vaping status: Never Used  Substance Use Topics   Alcohol use: Yes    Comment: occ   Drug use: No   Family History  Problem Relation Age of Onset   Heart disease Mother    Heart attack Mother        stent   Hyperlipidemia Mother    Colon cancer Mother 98       deceased 03-08-2014   Hypertension Mother    Stroke Mother    Glaucoma Father    Goiter Daughter    Colon polyps Neg Hx    Esophageal cancer Neg Hx    Stomach cancer Neg Hx    Rectal cancer Neg Hx    No Known Allergies Current Outpatient Medications on File Prior to Visit  Medication Sig Dispense Refill   HAWTHORNE BERRY PO Take by mouth.     Multiple Vitamin (MULTIVITAMIN) capsule Take 1 capsule by mouth daily.     Passion Flower-Valerian 500-500 MG CAPS Take by mouth.     No current facility-administered medications on file prior to visit.    Review of Systems  Constitutional:  Negative for activity change, appetite change, fatigue, fever and unexpected weight change.  HENT:  Negative for congestion, ear pain, rhinorrhea, sinus pressure and sore throat.   Eyes:  Negative for pain, redness and visual disturbance.  Respiratory:  Negative for cough, shortness of breath and wheezing.   Cardiovascular:  Negative for chest pain and palpitations.  Gastrointestinal:  Negative for abdominal pain, blood in stool, constipation and diarrhea.  Endocrine: Negative for polydipsia and polyuria.  Genitourinary:  Negative for dysuria, frequency and urgency.  Musculoskeletal:  Negative for arthralgias, back pain and myalgias.  Skin:  Negative for pallor and rash.  Allergic/Immunologic: Negative for environmental allergies.  Neurological:  Negative for dizziness, syncope and headaches.   Hematological:  Negative for adenopathy. Does not bruise/bleed easily.  Psychiatric/Behavioral:  Negative for decreased concentration and dysphoric mood. The patient is not nervous/anxious.        Objective:   Physical Exam Constitutional:      General: She is not in acute distress.    Appearance: Normal appearance. She is well-developed. She is not ill-appearing or diaphoretic.  HENT:     Head: Normocephalic and atraumatic.     Right Ear: Tympanic membrane, ear canal and external ear normal.     Left Ear: Tympanic membrane, ear canal and external ear normal.     Nose: Nose normal. No congestion.     Mouth/Throat:     Mouth: Mucous membranes are moist.     Pharynx: Oropharynx is clear. No posterior oropharyngeal erythema.  Eyes:     General: No scleral icterus.    Extraocular Movements: Extraocular movements intact.     Conjunctiva/sclera: Conjunctivae normal.     Pupils: Pupils are equal, round, and reactive to light.  Neck:     Thyroid : No thyromegaly.     Vascular: No carotid bruit or JVD.  Cardiovascular:     Rate and Rhythm: Normal rate and regular rhythm.     Pulses: Normal pulses.  Heart sounds: Normal heart sounds.     No gallop.  Pulmonary:     Effort: Pulmonary effort is normal. No respiratory distress.     Breath sounds: Normal breath sounds. No wheezing.     Comments: Good air exch Chest:     Chest wall: No tenderness.  Abdominal:     General: Bowel sounds are normal. There is no distension or abdominal bruit.     Palpations: Abdomen is soft. There is no mass.     Tenderness: There is no abdominal tenderness.     Hernia: No hernia is present.  Genitourinary:    Comments: Breast and pelvic exam are done by gyn provider   Musculoskeletal:        General: No tenderness. Normal range of motion.     Cervical back: Normal range of motion and neck supple. No rigidity. No muscular tenderness.     Right lower leg: No edema.     Left lower leg: No edema.      Comments: No kyphosis   Lymphadenopathy:     Cervical: No cervical adenopathy.  Skin:    General: Skin is warm and dry.     Coloration: Skin is not pale.     Findings: No erythema or rash.  Neurological:     Mental Status: She is alert. Mental status is at baseline.     Cranial Nerves: No cranial nerve deficit.     Motor: No abnormal muscle tone.     Coordination: Coordination normal.     Gait: Gait normal.     Deep Tendon Reflexes: Reflexes are normal and symmetric. Reflexes normal.  Psychiatric:        Mood and Affect: Mood normal.        Cognition and Memory: Cognition and memory normal.           Assessment & Plan:   Problem List Items Addressed This Visit       Cardiovascular and Mediastinum   Essential hypertension, benign   Blood pressure is much lower at home but pt uses wrist cuff as well  BP: (!) 161/90   Making good effort for diet/exercise now that she is retired  This may help  Encouraged to get arm cuff if affordable Follow up 1 month with cuff and readings to calibrate it  If still up will add treatment  Continue hydrochlorothiazide  12.5 mg daily  Labs reviewed       Relevant Medications   hydrochlorothiazide  (MICROZIDE ) 12.5 MG capsule     Other   Routine general medical examination at a health care facility - Primary   Reviewed health habits including diet and exercise and skin cancer prevention Reviewed appropriate screening tests for age  Also reviewed health mt list, fam hx and immunization status , as well as social and family history   See HPI Labs reviewed and ordered Health Maintenance  Topic Date Due   Zoster (Shingles) Vaccine (1 of 2) Never done   Pneumonia Vaccine (1 of 1 - PCV) Never done   DEXA scan (bone density measurement)  Never done   Hepatitis C Screening  02/21/2025*   HIV Screening  02/21/2025*   COVID-19 Vaccine (6 - 2024-25 season) 03/09/2025*   Flu Shot  05/11/2024   Mammogram  01/15/2025   Colon Cancer Screening   04/23/2027   Pap with HPV screening  02/12/2029   DTaP/Tdap/Td vaccine (4 - Td or Tdap) 02/16/2033   HPV Vaccine  Aged Out   Meningitis  B Vaccine  Aged Out  *Topic was postponed. The date shown is not the original due date.    Declines Hep C/HIV screen due to low risk status  Considering shingrix vaccine  Will return here in 1 month for prevnar 20 vaccine  Utd gyn care  Discussed fall prevention, supplements and exercise for bone density  Dexa ordered  PHQ0        Overweight (BMI 25.0-29.9)   Discussed how this problem influences overall health and the risks it imposes  Reviewed plan for weight loss with lower calorie diet (via better food choices (lower glycemic and portion control) along with exercise building up to or more than 30 minutes 5 days per week including some aerobic activity and strength training   Able to exercise and eat better now with retirement      Hyperlipidemia   Disc goals for lipids and reasons to control them Rev last labs with pt Rev low sat fat diet in detail  LDL up with bad eating habits when she retired Doing better now   Will repeat this in 3 mo       Relevant Medications   hydrochlorothiazide  (MICROZIDE ) 12.5 MG capsule   Other Relevant Orders   Lipid panel   Family history of colon cancer   Gets colonoscopy every 5 years Due in 2028      Estrogen deficiency   First dexa ordered at [facility]       Relevant Orders   DG Bone Density   Anxious depression   Was anxious at work before retirement  Better now  PHQ and GAD 0   Will continue to follow

## 2024-02-22 NOTE — Patient Instructions (Addendum)
 If you are interested in the shingles vaccine series (Shingrix), call your insurance or pharmacy to check on coverage and location it must be given.  If affordable - you can schedule it here or at your pharmacy depending on coverage   Keep exercising   For cholesterol Avoid red meat/ fried foods/ egg yolks/ fatty breakfast meats/ butter, cheese and high fat dairy/ and shellfish   Cut back on the junk in your coffee   Let's re check cholesterol in 3 months     Follow up in a month with your blood pressure cuff - bring a log of blood pressures  Get an arm monitor (OMRON) size regular  We can do the pneumonia shot that day     You have an order for:  []   3D Mammogram  [x]   Bone Density     Please call for appointment:   []   [facility]  [address] [place] [address]  [phone]  []   [facility])   [address]  [place], [place] [address]  [phone]  []   [facility]      [address] [place], [place]        [phone]         []   [facility]  [address] [place], [place]  [phone]  [x]  [facility]   [address]   [place], [place] [address]  [phone]  []  [facility]  [address] [place], [place] [address] [phone]    Make sure to wear two piece clothing  No lotions powders or deodorants the day of the appointment Make sure to bring picture ID and insurance card.  Bring list of medications you are currently taking including any supplements.   Schedule your screening mammogram through MyChart!   Select [place] imaging sites can now be scheduled through MyChart.  Log into your MyChart account.  Go to 'Visit' (or 'Appointments' if  on mobile App) --> Schedule an   Appointment  Under 'Select a Reason for Visit' choose the Mammogram  Screening option.  Complete the pre-visit questions  and select the time and place that  best fits your schedule

## 2024-02-22 NOTE — Assessment & Plan Note (Signed)
 First dexa ordered at [facility]

## 2024-03-21 ENCOUNTER — Ambulatory Visit
Admission: RE | Admit: 2024-03-21 | Discharge: 2024-03-21 | Disposition: A | Discharge status: Home / Self Care | Source: Ambulatory Visit | Attending: Family Medicine | Admitting: Family Medicine

## 2024-03-21 DIAGNOSIS — E2839 Other primary ovarian failure: Secondary | ICD-10-CM

## 2024-03-24 ENCOUNTER — Ambulatory Visit: Payer: Self-pay | Admitting: Family Medicine

## 2024-03-24 DIAGNOSIS — M810 Age-related osteoporosis without current pathological fracture: Secondary | ICD-10-CM | POA: Insufficient documentation

## 2024-03-26 ENCOUNTER — Ambulatory Visit (INDEPENDENT_AMBULATORY_CARE_PROVIDER_SITE_OTHER): Admitting: Family Medicine

## 2024-03-26 ENCOUNTER — Encounter: Payer: Self-pay | Admitting: Family Medicine

## 2024-03-26 VITALS — BP 140/86 | HR 70 | Temp 98.3°F | Ht 64.0 in | Wt 170.1 lb

## 2024-03-26 DIAGNOSIS — Z23 Encounter for immunization: Secondary | ICD-10-CM | POA: Diagnosis not present

## 2024-03-26 DIAGNOSIS — I10 Essential (primary) hypertension: Secondary | ICD-10-CM | POA: Diagnosis not present

## 2024-03-26 NOTE — Progress Notes (Signed)
 Subjective:    Patient ID: [name], female    DOB: 1960-11-04, 63 y.o.   MRN: [id number]  HPI  Wt Readings from Last 3 Encounters:  03/26/24 170 lb 2 oz (77.2 kg)  02/22/24 170 lb (77.1 kg)  02/13/24 173 lb (78.5 kg)   29.20 kg/m  Vitals:   03/26/24 0947 03/26/24 1015  BP: (!) 158/92 (!) 140/86  Pulse: 70   Temp: 98.3 F (36.8 C)   SpO2: 98%     Pt presents for follow up of HTN Also to update pna vaccine    HTN bp is stable today  No cp or palpitations or headaches or edema  No side effects to medicines  BP Readings from Last 3 Encounters:  03/26/24 (!) 140/86  02/22/24 (!) 161/90  02/13/24 (!) 169/108    Hydrochlorothiazide  12.5 mg daily   Discussed checking blood pressure at home and working on lifestyle habits last time   Both ofher cuffs run higher than ours today    For the most part at home avg 120s to 130s / 70s-80s Mostly with wrist cuff   Feeling fine  Taking care of herself -finally has time   Eating healthy   Some knee problems - has had some knee injuries    Often has to check it 3 times and number goes down Feels anxious and has to relax   Lab Results  Component Value Date   NA 138 02/14/2024   K 3.8 02/14/2024   CO2 31 02/14/2024   GLUCOSE 92 02/14/2024   BUN 12 02/14/2024   CREATININE 1.05 02/14/2024   CALCIUM 9.6 02/14/2024   GFR 55.83 (L) 02/14/2024   GFRNONAA 75.08 02/11/2010       Patient Active Problem List   Diagnosis Date Noted   White coat syndrome with hypertension 03/26/2024   Osteoporosis 03/24/2024   Estrogen deficiency 02/22/2024   Seborrheic keratoses, inflamed 09/17/2022   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   GERD (gastroesophageal reflux disease) 12/03/2019   Allergic rhinitis 08/02/2019   Overweight (BMI 25.0-29.9) 11/15/2016   Family history of colon cancer 05/14/2015   Routine general medical examination at a health care facility 10/01/2012   Essential hypertension, benign 01/29/2010    Hyperlipidemia 10/09/2007   Osteoarthritis of both knees 10/09/2007   Past Medical History:  Diagnosis Date   Abnormal finding on Pap smear 2006   endometrial cells, biopsy ok   Anxiety    Fibroids    uterine   Herpes zoster    mild case   History of pelvic ultrasound 10/2007   uterine fibroids   Hyperlipidemia    Hypertension    with some white coat component   Past Surgical History:  Procedure Laterality Date   COLONOSCOPY     CRYOABLATION  1982   abnormal pap   INTRAUTERINE DEVICE INSERTION  2006   MENISCUS REPAIR Right 2002, 03/07/2015   WISDOM TOOTH EXTRACTION     Social History   Tobacco Use   Smoking status: Never   Smokeless tobacco: Never  Vaping Use   Vaping status: Never Used  Substance Use Topics   Alcohol use: Yes    Comment: occ   Drug use: No   Family History  Problem Relation Age of Onset   Heart disease Mother    Heart attack Mother        stent   Hyperlipidemia Mother    Colon cancer Mother 59       deceased 03/25/2014   Hypertension  Mother    Stroke Mother    Glaucoma Father    Goiter Daughter    Colon polyps Neg Hx    Esophageal cancer Neg Hx    Stomach cancer Neg Hx    Rectal cancer Neg Hx    No Known Allergies Current Outpatient Medications on File Prior to Visit  Medication Sig Dispense Refill   HAWTHORNE BERRY PO Take by mouth.     hydrochlorothiazide  (MICROZIDE ) 12.5 MG capsule TAKE 1 CAPSULE BY MOUTH EVERY DAY IN THE MORNING 90 capsule 3   Multiple Vitamin (MULTIVITAMIN) capsule Take 1 capsule by mouth daily.     Passion Flower-Valerian 500-500 MG CAPS Take by mouth.     No current facility-administered medications on file prior to visit.    Review of Systems  Constitutional:  Negative for activity change, appetite change, fatigue, fever and unexpected weight change.  HENT:  Negative for congestion, ear pain, rhinorrhea, sinus pressure and sore throat.   Eyes:  Negative for pain, redness and visual disturbance.  Respiratory:   Negative for cough, shortness of breath and wheezing.   Cardiovascular:  Negative for chest pain and palpitations.  Gastrointestinal:  Negative for abdominal pain, blood in stool, constipation and diarrhea.  Endocrine: Negative for polydipsia and polyuria.  Genitourinary:  Negative for dysuria, frequency and urgency.  Musculoskeletal:  Negative for arthralgias, back pain and myalgias.  Skin:  Negative for pallor and rash.  Allergic/Immunologic: Negative for environmental allergies.  Neurological:  Negative for dizziness, syncope and headaches.  Hematological:  Negative for adenopathy. Does not bruise/bleed easily.  Psychiatric/Behavioral:  Negative for decreased concentration and dysphoric mood. The patient is not nervous/anxious.        Objective:   Physical Exam Constitutional:      General: She is not in acute distress.    Appearance: Normal appearance. She is well-developed. She is obese. She is not ill-appearing or diaphoretic.  HENT:     Head: Normocephalic and atraumatic.   Eyes:     Conjunctiva/sclera: Conjunctivae normal.     Pupils: Pupils are equal, round, and reactive to light.   Neck:     Thyroid : No thyromegaly.     Vascular: No carotid bruit or JVD.   Cardiovascular:     Rate and Rhythm: Normal rate and regular rhythm.     Heart sounds: Normal heart sounds.     No gallop.  Pulmonary:     Effort: Pulmonary effort is normal. No respiratory distress.     Breath sounds: Normal breath sounds. No wheezing or rales.  Abdominal:     General: There is no distension or abdominal bruit.     Palpations: Abdomen is soft.   Musculoskeletal:     Cervical back: Normal range of motion and neck supple.     Right lower leg: No edema.     Left lower leg: No edema.  Lymphadenopathy:     Cervical: No cervical adenopathy.   Skin:    General: Skin is warm and dry.     Coloration: Skin is not pale.     Findings: No rash.   Neurological:     Mental Status: She is alert.      Coordination: Coordination normal.     Deep Tendon Reflexes: Reflexes are normal and symmetric. Reflexes normal.   Psychiatric:        Mood and Affect: Mood normal.           Assessment & Plan:   Problem List Items Addressed  This Visit       Cardiovascular and Mediastinum   White coat syndrome with hypertension   Pt has much lower blood pressure at home Her cuffs both run higher than ours  Avg 120s-130s systolic   Discussed proper way to check  Need to do when relaxed Will continue to follow   Of note-pt does not struggle with anxiety significantly if not checking bp      Essential hypertension, benign - Primary   bp in fair control at this time - has a white coat component and generally much higher here than at home  Avg blood pressure at home is 120s/130s / 70s  Uses both wrist and arm cuff (they both run higher than our cuff today) On 2nd check BP Readings from Last 1 Encounters:  03/26/24 (!) 140/86   No changes needed Most recent labs reviewed  Disc lifstyle change with low sodium diet and exercise  Doing well with self care   Will continue to monitor  Call back and Er precautions noted in detail today

## 2024-03-26 NOTE — Patient Instructions (Addendum)
 Stick to low impact exercise if needed for knees    Keep monitoring at home  Use arm cuff when you can   Sit upright  Both feet on floor / do not cross legs   Arm supported at heart level When as relaxed as possible   Watching for blood pressure over 140 top / 90 bottom  -consecutive    Keep working on diet and exercise

## 2024-03-26 NOTE — Assessment & Plan Note (Signed)
 Pt has much lower blood pressure at home Her cuffs both run higher than ours  Avg 120s-130s systolic   Discussed proper way to check  Need to do when relaxed Will continue to follow   Of note-pt does not struggle with anxiety significantly if not checking bp

## 2024-03-26 NOTE — Assessment & Plan Note (Signed)
 bp in fair control at this time - has a white coat component and generally much higher here than at home  Avg blood pressure at home is 120s/130s / 70s  Uses both wrist and arm cuff (they both run higher than our cuff today) On 2nd check BP Readings from Last 1 Encounters:  03/26/24 (!) 140/86   No changes needed Most recent labs reviewed  Disc lifstyle change with low sodium diet and exercise  Doing well with self care   Will continue to monitor  Call back and Er precautions noted in detail today

## 2024-05-24 ENCOUNTER — Other Ambulatory Visit

## 2024-06-13 ENCOUNTER — Other Ambulatory Visit

## 2024-06-18 ENCOUNTER — Other Ambulatory Visit (INDEPENDENT_AMBULATORY_CARE_PROVIDER_SITE_OTHER)

## 2024-06-18 DIAGNOSIS — E7800 Pure hypercholesterolemia, unspecified: Secondary | ICD-10-CM

## 2024-06-18 LAB — LIPID PANEL
Cholesterol: 209 mg/dL — ABNORMAL HIGH (ref 0–200)
HDL: 71.8 mg/dL (ref >39.00)
LDL Cholesterol: 120 mg/dL — ABNORMAL HIGH (ref 0–99)
NonHDL: 137.58
Total CHOL/HDL Ratio: 3
Triglycerides: 89 mg/dL (ref 0.0–149.0)
VLDL: 17.8 mg/dL (ref 0.0–40.0)

## 2024-06-18 NOTE — Progress Notes (Signed)
 Cholesterol did improve some with better eating but still not quite as low as a year ago  LDL is 120 down from 138 Avoid red meat/ fried foods/ egg yolks/ fatty breakfast meats/ butter, cheese and high fat dairy/ and shellfish  also How is your blood pressure running at home lately?

## 2024-06-19 NOTE — Progress Notes (Signed)
 Good/ reassuring

## 2024-08-16 ENCOUNTER — Ambulatory Visit: Payer: Self-pay

## 2024-08-16 NOTE — Telephone Encounter (Signed)
 Thanks for seeing her  Aware, will watch for correspondence

## 2024-08-16 NOTE — Telephone Encounter (Signed)
 FYI Only or Action Required?: FYI only for provider: appointment scheduled on 08/17/24.  Patient was last seen in primary care on 03/26/2024 by Randeen Laine LABOR, MD.  Called Nurse Triage reporting Facial Pain.  Symptoms began several weeks ago.  Interventions attempted: OTC medications: Dayquil, zyrtec, nasal spray.  Symptoms are: gradually worsening.  Triage Disposition: See PCP When Office is Open (Within 3 Days)  Patient/caregiver understands and will follow disposition?: Yes  Copied from CRM #[id number]. Topic: Clinical - Red Word Triage >> Aug 16, 2024 12:15 PM Rea ORN wrote: Red Word that prompted transfer to Nurse Triage: increased mucus, sinus pressure and pain in face Reason for Disposition  [1] Sinus congestion (pressure, fullness) AND [2] present > 10 days  Answer Assessment - Initial Assessment Questions Onset of sinus congestion and cough for past 2 weeks. Has tried several OTC's without improvement. Denies fever. Scheduled virtual visit for tomorrow and advised UC for worsening symptoms.  1. LOCATION: Where does it hurt?      Sinuses  2. ONSET: When did the sinus pain start?  (e.g., hours, days)      2 weeks ago  3. SEVERITY: How bad is the pain?   (Scale 0-10; or none, mild, moderate or severe)     2/10  4. RECURRENT SYMPTOM: Have you ever had sinus problems before? If Yes, ask: When was the last time? and What happened that time?      Years ago, unsure what happened  5. NASAL CONGESTION: Is the nose blocked? If Yes, ask: Can you open it or must you breathe through your mouth?     Able to breathe through nose  6. NASAL DISCHARGE: Do you have discharge from your nose? If so ask, What color?     Clear to yellow  7. FEVER: Do you have a fever? If Yes, ask: What is it, how was it measured, and when did it start?      No  8. OTHER SYMPTOMS: Do you have any other symptoms? (e.g., sore throat, cough, earache, difficulty breathing)     Cough,  post-nasal drip  Protocols used: Sinus Pain or Congestion-A-AH

## 2024-08-17 ENCOUNTER — Telehealth: Admitting: General Practice

## 2024-08-17 ENCOUNTER — Encounter: Payer: Self-pay | Admitting: General Practice

## 2024-08-17 VITALS — Ht 64.0 in | Wt 166.0 lb

## 2024-08-17 DIAGNOSIS — J0100 Acute maxillary sinusitis, unspecified: Secondary | ICD-10-CM

## 2024-08-17 MED ORDER — AMOXICILLIN-POT CLAVULANATE 875-125 MG PO TABS: 1.0000 | ORAL_TABLET | Freq: Two times a day (BID) | ORAL | 0 refills | Status: AC

## 2024-08-17 MED ORDER — FLUTICASONE PROPIONATE 50 MCG/ACT NA SUSP: 2.0000 | Freq: Every day | NASAL | 0 refills | Status: DC

## 2024-08-17 NOTE — Progress Notes (Signed)
 Virtual Visit via Video Note  I connected with [name] on 08/17/24 at 10:00 AM EST by a video enabled telemedicine application and verified that I am speaking with the correct person using two identifiers.  Patient Location: Home Provider Location: Office/Clinic  I discussed the limitations, risks, security, and privacy concerns of performing an evaluation and management service by video and the availability of in person appointments. I also discussed with the patient that there may be a patient responsible charge related to this service. The patient expressed understanding and agreed to proceed.  Subjective: PCP: Randeen Laine LABOR, MD  Chief Complaint  Patient presents with   Sinus Problem    Facial pain and nasal congestion x 2 or 3 days. Patient had a cold about 2 weeks ago were she was taking day quil and nyquil for that which she is done with. Patient is now just doing saline and allergy medication.    Sinus Problem    [name] is a 63 year old female, patient of Dr. Randeen, presents today via virtual visit for an acute visit.   Discussed the use of AI scribe software for clinical note transcription with the patient, who gave verbal consent to proceed.  History of Present Illness [name] is a 63 year old female who presents with facial pain and nasal congestion.  She has been experiencing facial pain and nasal congestion for the past two to three days, following a cold she had approximately two weeks ago. During the cold, she took DayQuil and Nyquil and believes she recovered, but the nasal congestion has persisted.  The nasal congestion is accompanied by postnasal drip, leading to coughing due to mucus in her throat. The mucus is mostly clear, although it has been yellow on a few occasions. She experiences dull facial pain, particularly when blowing her nose, located in the areas above her nose and cheeks.  She has been using Zyrtec and Astapro nasal spray with  minimal relief. No fever, chills, or ear pain. She has tested negative for COVID-19 at home.   ROS: Per HPI  Current Outpatient Medications:    amoxicillin -clavulanate (AUGMENTIN ) 875-125 MG tablet, Take 1 tablet by mouth 2 (two) times daily for 7 days., Disp: 14 tablet, Rfl: 0   fluticasone (FLONASE) 50 MCG/ACT nasal spray, Place 2 sprays into both nostrils daily., Disp: 16 g, Rfl: 0   HAWTHORNE BERRY PO, Take by mouth., Disp: , Rfl:    hydrochlorothiazide  (MICROZIDE ) 12.5 MG capsule, TAKE 1 CAPSULE BY MOUTH EVERY DAY IN THE MORNING, Disp: 90 capsule, Rfl: 3   Multiple Vitamin (MULTIVITAMIN) capsule, Take 1 capsule by mouth daily., Disp: , Rfl:    Passion Flower-Valerian 500-500 MG CAPS, Take by mouth., Disp: , Rfl:   Observations/Objective: Today's Vitals   08/17/24 0958  Weight: 166 lb (75.3 kg)  Height: 5' 4 (1.626 m)   Physical Exam Vitals and nursing note reviewed.  Constitutional:      Appearance: Normal appearance.  Cardiovascular:     Rate and Rhythm: Normal rate and regular rhythm.     Pulses: Normal pulses.     Heart sounds: Normal heart sounds.  Pulmonary:     Effort: Pulmonary effort is normal.     Breath sounds: Normal breath sounds.  Neurological:     Mental Status: She is alert and oriented to person, place, and time.  Psychiatric:        Mood and Affect: Mood normal.  Behavior: Behavior normal.        Thought Content: Thought content normal.        Judgment: Judgment normal.     Assessment and Plan: Acute maxillary sinusitis, recurrence not specified -     Amoxicillin -Pot Clavulanate; Take 1 tablet by mouth 2 (two) times daily for 7 days.  Dispense: 14 tablet; Refill: 0 -     Fluticasone Propionate; Place 2 sprays into both nostrils daily.  Dispense: 16 g; Refill: 0   Assessment and Plan Assessment & Plan Acute maxillary sinusitis with postnasal drip and chest congestion - Prescribed Augmentin  1 tablet twice daily for 7 days. - Continue Zyrtec  for allergy symptoms. - Prescribed Flonase nasal spray for sinus pressure and pain. - Recommended Mucinex for chest congestion. - Advised rest, increased fluid intake, and use of a humidifier. - Instructed to seek emergency care if experiencing chest pain, shortness of breath, or difficulty breathing.   Follow Up Instructions: Return if symptoms worsen or fail to improve.   I discussed the assessment and treatment plan with the patient. The patient was provided an opportunity to ask questions, and all were answered. The patient agreed with the plan and demonstrated an understanding of the instructions.   The patient was advised to call back or seek an in-person evaluation if the symptoms worsen or if the condition fails to improve as anticipated.  The above assessment and management plan was discussed with the patient. The patient verbalized understanding of and has agreed to the management plan.   Carrol Aurora, NP

## 2024-08-17 NOTE — Patient Instructions (Addendum)
 Start Augmentin  antibiotics. Take 1 tablet by mouth twice daily for 7 days.  You can try a few things over the counter to help with your symptoms including:  Cough: Delsym or Robitussin (get the off brand, works just as well) Chest Congestion: Mucinex (plain) Nasal Congestion/Ear Pressure/Sinus Pressure: Try using Flonase (fluticasone) nasal spray. Instill 1 spray in each nostril twice daily. This can be purchased over the counter. Body aches, fevers, headache: Ibuprofen (not to exceed 2400 mg in 24 hours) or Acetaminophen-Tylenol (not to exceed 3000 mg in 24 hours) Runny Nose/Throat Drainage/Sneezing/Itchy or Watery Eyes: An antihistamine such as Zyrtec, Claritin, Xyzal, Allegra  Rest, increase fluid intake and use a humidifier.   Let me know if your symptoms do not improve.   It was a pleasure meeting you!

## 2024-09-09 ENCOUNTER — Other Ambulatory Visit: Payer: Self-pay | Admitting: General Practice

## 2024-09-09 DIAGNOSIS — J0100 Acute maxillary sinusitis, unspecified: Secondary | ICD-10-CM
